# Patient Record
Sex: Male | Born: 1945 | Race: White | Hispanic: No | Marital: Married | Smoking: Never smoker
Health system: Southern US, Community
[De-identification: ages and names within clinical notes are randomized; demographics above are authoritative.]

## PROBLEM LIST (undated history)

## (undated) DIAGNOSIS — I10 Essential (primary) hypertension: Secondary | ICD-10-CM

## (undated) DIAGNOSIS — M940 Chondrocostal junction syndrome [Tietze]: Secondary | ICD-10-CM

## (undated) DIAGNOSIS — H409 Unspecified glaucoma: Secondary | ICD-10-CM

## (undated) DIAGNOSIS — E119 Type 2 diabetes mellitus without complications: Secondary | ICD-10-CM

## (undated) HISTORY — PX: MANDIBLE FRACTURE SURGERY: SHX706

## (undated) HISTORY — PX: GLAUCOMA SURGERY: SHX656

## (undated) HISTORY — PX: TONSILLECTOMY: SUR1361

---

## 2013-10-13 DIAGNOSIS — H401190 Primary open-angle glaucoma, unspecified eye, stage unspecified: Secondary | ICD-10-CM

## 2013-10-13 HISTORY — DX: Primary open-angle glaucoma, unspecified eye, stage unspecified: H40.1190

## 2017-02-11 DIAGNOSIS — H6982 Other specified disorders of Eustachian tube, left ear: Secondary | ICD-10-CM

## 2017-02-11 DIAGNOSIS — H6992 Unspecified Eustachian tube disorder, left ear: Secondary | ICD-10-CM

## 2017-02-11 HISTORY — DX: Other specified disorders of eustachian tube, left ear: H69.82

## 2017-02-11 HISTORY — DX: Unspecified Eustachian tube disorder, left ear: H69.92

## 2018-01-21 DIAGNOSIS — H8112 Benign paroxysmal vertigo, left ear: Secondary | ICD-10-CM

## 2018-01-21 HISTORY — DX: Benign paroxysmal vertigo, left ear: H81.12

## 2019-06-08 DIAGNOSIS — H401432 Capsular glaucoma with pseudoexfoliation of lens, bilateral, moderate stage: Secondary | ICD-10-CM

## 2019-06-08 HISTORY — DX: Capsular glaucoma with pseudoexfoliation of lens, bilateral, moderate stage: H40.1432

## 2019-09-12 ENCOUNTER — Ambulatory Visit: Payer: Self-pay

## 2019-09-14 DIAGNOSIS — I4891 Unspecified atrial fibrillation: Secondary | ICD-10-CM

## 2019-09-14 DIAGNOSIS — H409 Unspecified glaucoma: Secondary | ICD-10-CM | POA: Insufficient documentation

## 2019-09-14 HISTORY — DX: Unspecified atrial fibrillation: I48.91

## 2019-09-15 ENCOUNTER — Observation Stay (HOSPITAL_BASED_OUTPATIENT_CLINIC_OR_DEPARTMENT_OTHER)
Admission: EM | Admit: 2019-09-15 | Discharge: 2019-09-16 | Disposition: A | Discharge status: Home / Self Care | Payer: Medicare HMO | Attending: Cardiology | Admitting: Cardiology

## 2019-09-15 ENCOUNTER — Other Ambulatory Visit: Payer: Self-pay

## 2019-09-15 ENCOUNTER — Emergency Department (HOSPITAL_BASED_OUTPATIENT_CLINIC_OR_DEPARTMENT_OTHER): Payer: Medicare HMO

## 2019-09-15 ENCOUNTER — Encounter (HOSPITAL_BASED_OUTPATIENT_CLINIC_OR_DEPARTMENT_OTHER): Payer: Self-pay

## 2019-09-15 DIAGNOSIS — Z79899 Other long term (current) drug therapy: Secondary | ICD-10-CM | POA: Diagnosis not present

## 2019-09-15 DIAGNOSIS — Z20822 Contact with and (suspected) exposure to covid-19: Secondary | ICD-10-CM | POA: Diagnosis not present

## 2019-09-15 DIAGNOSIS — E876 Hypokalemia: Secondary | ICD-10-CM | POA: Insufficient documentation

## 2019-09-15 DIAGNOSIS — E119 Type 2 diabetes mellitus without complications: Secondary | ICD-10-CM | POA: Insufficient documentation

## 2019-09-15 DIAGNOSIS — E785 Hyperlipidemia, unspecified: Secondary | ICD-10-CM | POA: Diagnosis not present

## 2019-09-15 DIAGNOSIS — M940 Chondrocostal junction syndrome [Tietze]: Principal | ICD-10-CM | POA: Insufficient documentation

## 2019-09-15 DIAGNOSIS — I4891 Unspecified atrial fibrillation: Secondary | ICD-10-CM | POA: Diagnosis present

## 2019-09-15 DIAGNOSIS — H409 Unspecified glaucoma: Secondary | ICD-10-CM | POA: Insufficient documentation

## 2019-09-15 DIAGNOSIS — N289 Disorder of kidney and ureter, unspecified: Secondary | ICD-10-CM

## 2019-09-15 DIAGNOSIS — I10 Essential (primary) hypertension: Secondary | ICD-10-CM | POA: Diagnosis not present

## 2019-09-15 DIAGNOSIS — Z7982 Long term (current) use of aspirin: Secondary | ICD-10-CM | POA: Diagnosis not present

## 2019-09-15 HISTORY — DX: Type 2 diabetes mellitus without complications: E11.9

## 2019-09-15 HISTORY — DX: Unspecified glaucoma: H40.9

## 2019-09-15 HISTORY — DX: Essential (primary) hypertension: I10

## 2019-09-15 HISTORY — DX: Chondrocostal junction syndrome (tietze): M94.0

## 2019-09-15 LAB — CBC WITH DIFFERENTIAL/PLATELET
Abs Immature Granulocytes: 0.02 10*3/uL (ref 0.00–0.07)
Basophils Absolute: 0.1 10*3/uL (ref 0.0–0.1)
Basophils Relative: 1 %
Eosinophils Absolute: 0.6 10*3/uL — ABNORMAL HIGH (ref 0.0–0.5)
Eosinophils Relative: 6 %
HCT: 53.2 % — ABNORMAL HIGH (ref 39.0–52.0)
Hemoglobin: 18 g/dL — ABNORMAL HIGH (ref 13.0–17.0)
Immature Granulocytes: 0 %
Lymphocytes Relative: 40 %
Lymphs Abs: 3.6 10*3/uL (ref 0.7–4.0)
MCH: 31.6 pg (ref 26.0–34.0)
MCHC: 33.8 g/dL (ref 30.0–36.0)
MCV: 93.5 fL (ref 80.0–100.0)
Monocytes Absolute: 0.6 10*3/uL (ref 0.1–1.0)
Monocytes Relative: 7 %
Neutro Abs: 4.1 10*3/uL (ref 1.7–7.7)
Neutrophils Relative %: 46 %
Platelets: 268 10*3/uL (ref 150–400)
RBC: 5.69 MIL/uL (ref 4.22–5.81)
RDW: 12.9 % (ref 11.5–15.5)
WBC: 9 10*3/uL (ref 4.0–10.5)
nRBC: 0 % (ref 0.0–0.2)

## 2019-09-15 LAB — COMPREHENSIVE METABOLIC PANEL
ALT: 15 U/L (ref 0–44)
AST: 18 U/L (ref 15–41)
Albumin: 4.5 g/dL (ref 3.5–5.0)
Alkaline Phosphatase: 67 U/L (ref 38–126)
Anion gap: 11 (ref 5–15)
BUN: 18 mg/dL (ref 8–23)
CO2: 22 mmol/L (ref 22–32)
Calcium: 9.1 mg/dL (ref 8.9–10.3)
Chloride: 104 mmol/L (ref 98–111)
Creatinine, Ser: 1.35 mg/dL — ABNORMAL HIGH (ref 0.61–1.24)
GFR calc Af Amer: 60 mL/min — ABNORMAL LOW (ref >60)
GFR calc non Af Amer: 51 mL/min — ABNORMAL LOW (ref >60)
Glucose, Bld: 124 mg/dL — ABNORMAL HIGH (ref 70–99)
Potassium: 3.2 mmol/L — ABNORMAL LOW (ref 3.5–5.1)
Sodium: 137 mmol/L (ref 135–145)
Total Bilirubin: 0.6 mg/dL (ref 0.3–1.2)
Total Protein: 7.8 g/dL (ref 6.5–8.1)

## 2019-09-15 LAB — BRAIN NATRIURETIC PEPTIDE: B Natriuretic Peptide: 168.6 pg/mL — ABNORMAL HIGH (ref 0.0–100.0)

## 2019-09-15 LAB — TROPONIN I (HIGH SENSITIVITY): Troponin I (High Sensitivity): 5 ng/L (ref <18)

## 2019-09-15 LAB — MAGNESIUM: Magnesium: 2.2 mg/dL (ref 1.7–2.4)

## 2019-09-15 MED ORDER — DILTIAZEM LOAD VIA INFUSION
10.0000 mg | Freq: Once | INTRAVENOUS | Status: AC
  Administered 2019-09-15: 10 mg via INTRAVENOUS
  Filled 2019-09-15: qty 10

## 2019-09-15 MED ORDER — SODIUM CHLORIDE 0.9 % IV SOLN: INTRAVENOUS | Status: DC

## 2019-09-15 MED ORDER — SODIUM CHLORIDE 0.9 % IV BOLUS
250.0000 mL | Freq: Once | INTRAVENOUS | Status: AC
  Administered 2019-09-15: 250 mL via INTRAVENOUS

## 2019-09-15 MED ORDER — ASPIRIN 81 MG PO CHEW
324.0000 mg | CHEWABLE_TABLET | Freq: Once | ORAL | Status: AC
  Administered 2019-09-15: 324 mg via ORAL
  Filled 2019-09-15: qty 4

## 2019-09-15 MED ORDER — HEPARIN (PORCINE) 25000 UT/250ML-% IV SOLN
1300.0000 [IU]/h | INTRAVENOUS | Status: DC
  Administered 2019-09-16: 1300 [IU]/h via INTRAVENOUS
  Filled 2019-09-15: qty 250

## 2019-09-15 MED ORDER — HEPARIN BOLUS VIA INFUSION
4000.0000 [IU] | Freq: Once | INTRAVENOUS | Status: AC
  Administered 2019-09-16: 4000 [IU] via INTRAVENOUS

## 2019-09-15 MED ORDER — DILTIAZEM HCL-DEXTROSE 125-5 MG/125ML-% IV SOLN (PREMIX)
5.0000 mg/h | INTRAVENOUS | Status: DC
  Administered 2019-09-15: 5 mg/h via INTRAVENOUS
  Filled 2019-09-15: qty 125

## 2019-09-15 MED ORDER — POTASSIUM CHLORIDE CRYS ER 20 MEQ PO TBCR
40.0000 meq | EXTENDED_RELEASE_TABLET | Freq: Once | ORAL | Status: AC
  Administered 2019-09-15: 40 meq via ORAL
  Filled 2019-09-15: qty 2

## 2019-09-15 NOTE — ED Provider Notes (Signed)
TIME SEEN: 11:07 PM  CHIEF COMPLAINT: Right-sided chest discomfort  HPI: Patient is a 74 year old male with history of hypertension and diabetes no longer on medications for the past 2 years after losing weight, glaucoma who presents to the emergency department with right-sided chest discomfort that started tonight.  States that his wife checked his pulse and it was irregular.  Came to the emergency department and heart rate was in the 160s in A. fib.  No history of arrhythmia, A. fib.  Not on anticoagulation.  Does take baby aspirin daily.  Chest discomfort is now gone.  States he will intermittently get the same chest discomfort which he has been told previously was costochondritis.  States he has been experiencing it for months.  No associated shortness of breath, nausea, vomiting, dizziness or diaphoresis.  No fevers, cough, vomiting or diarrhea recently.  States he has no symptoms currently.  Denies history of peripheral vascular disease, CAD, stroke, CHF.  Does however take furosemide 3 times a week.   ROS: See HPI Constitutional: no fever  Eyes: no drainage  ENT: no runny nose   Cardiovascular:  chest pain  Resp: no SOB  GI: no vomiting GU: no dysuria Integumentary: no rash  Allergy: no hives  Musculoskeletal: no leg swelling  Neurological: no slurred speech ROS otherwise negative  PAST MEDICAL HISTORY/PAST SURGICAL HISTORY:  Past Medical History:  Diagnosis Date  . Diabetes mellitus without complication ([place])   . Hypertension     MEDICATIONS:  Prior to Admission medications   Not on File    ALLERGIES:  Not on File  SOCIAL HISTORY:  Social History   Tobacco Use  . Smoking status: Never Smoker  . Smokeless tobacco: Never Used  Substance Use Topics  . Alcohol use: Yes    Comment: occ    FAMILY HISTORY: No family history on file.  EXAM: BP (!) 198/138 (BP Location: Left Arm)   Pulse (!) 165   Temp 98.9 F (37.2 C) (Oral)   Resp 20   Ht 5\' 9"  (1.753 m)   Wt  90.7 kg   BMI 29.53 kg/m  CONSTITUTIONAL: Alert and oriented and responds appropriately to questions. Well-appearing; well-nourished, elderly, obese, extremely pleasant HEAD: Normocephalic EYES: Conjunctivae clear, pupils appear equal, EOM appear intact ENT: normal nose; moist mucous membranes NECK: Supple, normal ROM CARD: Irregularly irregular and tachycardic; S1 and S2 appreciated; no murmurs, no clicks, no rubs, no gallops RESP: Normal chest excursion without splinting or tachypnea; breath sounds clear and equal bilaterally; no wheezes, no rhonchi, no rales, no hypoxia or respiratory distress, speaking full sentences ABD/GI: Normal bowel sounds; non-distended; soft, non-tender, no rebound, no guarding, no peritoneal signs, no hepatosplenomegaly BACK:  The back appears normal EXT: Normal ROM in all joints; no deformity noted, no edema; no cyanosis; no calf tenderness or swelling SKIN: Normal color for age and race; warm; no rash on exposed skin NEURO: Moves all extremities equally PSYCH: The patient's mood and manner are appropriate.   MEDICAL DECISION MAKING: Patient here with A. fib with RVR.  No history of the same.  Currently asymptomatic.  States symptoms started with chest discomfort that is now gone.  Has had chest discomfort for months that he contributed to costochondritis.  Given it is unclear how long patient has had atrial fibrillation and he is not on anticoagulation, I do not feel he is a candidate for electrocardioversion at this time.  Will obtain cardiac labs, chest x-ray and start diltiazem.  Patient comfortable with  this plan.  Will give full dose aspirin.  ED PROGRESS: Patient's heart rate slowly improving with diltiazem.  Blood pressure is also coming down.  Still asymptomatic.  Troponin negative.  Potassium slightly low at 3.2.  Will give oral replacement.  Magnesium normal.  TSH pending.  Discussed with Dr. Alcario Drought with hospitalist service.  He is requesting admission by  specialty service given hospitalist service is extremely busy tonight.  Will discuss with cardiology.  12:19 AM  Spoke with Dr. Kalman Shan, cardiology fellow on-call.  Agrees to admission to stepdown, inpatient bed under Dr. Carlyle Dolly.  I have placed admission orders.  Dr. Kalman Shan also request that once patient's heart rate is under 110 that we start metoprolol 25 mg every 6 hours.  Patient updated with plan.     Covid test negative.  2:30 AM  [facility] at bedside for transport.  Patient stable and continues to be asymptomatic.   EKG Interpretation  Date/Time:  Tuesday September 15 2019 22:48:13 EST Ventricular Rate:  155 PR Interval:    QRS Duration: 83 QT Interval:  315 QTC Calculation: 506 R Axis:   34 Text Interpretation: Atrial fibrillation with rapid V-rate Low voltage, precordial leads Repolarization abnormality, prob rate related No previous ECGs available Confirmed by Fredia Sorrow [phone]) on 09/15/2019 10:50:30 PM        CRITICAL CARE Performed by: Cyril Mourning Deidrick Rainey   Total critical care time: 45 minutes  Critical care time was exclusive of separately billable procedures and treating other patients.  Critical care was necessary to treat or prevent imminent or life-threatening deterioration.  Critical care was time spent personally by me on the following activities: development of treatment plan with patient and/or surrogate as well as nursing, discussions with consultants, evaluation of patient's response to treatment, examination of patient, obtaining history from patient or surrogate, ordering and performing treatments and interventions, ordering and review of laboratory studies, ordering and review of radiographic studies, pulse oximetry and re-evaluation of patient's condition.   Trevone Linden was evaluated in Emergency Department on 09/15/2019 for the symptoms described in the history of present illness. He was evaluated in the context of the global COVID-19 pandemic,  which necessitated consideration that the patient might be at risk for infection with the SARS-CoV-2 virus that causes COVID-19. Institutional protocols and algorithms that pertain to the evaluation of patients at risk for COVID-19 are in a state of rapid change based on information released by regulatory bodies including the CDC and federal and state organizations. These policies and algorithms were followed during the patient's care in the ED.  Patient was seen wearing N95, face shield, gloves.    Stony Stegmann, Delice Bison, DO 09/16/19 0230

## 2019-09-15 NOTE — ED Notes (Signed)
ED Provider at bedside. 

## 2019-09-15 NOTE — Progress Notes (Signed)
ANTICOAGULATION CONSULT NOTE - Initial Consult  Pharmacy Consult for Heparin Indication: atrial fibrillation  Not on File  Patient Measurements: Height: 5\' 9"  (175.3 cm) Weight: 200 lb (90.7 kg) IBW/kg (Calculated) : 70.7 Heparin Dosing Weight: 85 kg  Vital Signs: Temp: 98.9 F (37.2 C) (02/02 2250) Temp Source: Oral (02/02 2250) BP: 160/104 (02/02 2346) Pulse Rate: 90 (02/02 2346)  Labs: Recent Labs    09/15/19 2255 09/15/19 2308  HGB 18.0*  --   HCT 53.2*  --   PLT 268  --   CREATININE 1.35*  --   TROPONINIHS  --  5    Estimated Creatinine Clearance: 53.4 mL/min (A) (by C-G formula based on SCr of 1.35 mg/dL (H)).   Medical History: Past Medical History:  Diagnosis Date  . Diabetes mellitus without complication ([place])   . Hypertension     Medications:  Combigan  Lasix    Assessment: 74 y.o. male with Afib for heparin  Goal of Therapy:  Heparin level 0.3-0.7 units/ml Monitor platelets by anticoagulation protocol: Yes   Plan:  Heparin 4000 units IV bolus, then start heparin 1300 units/hr Check heparin level in 8 hours.   Caryl Pina 09/15/2019,11:56 PM

## 2019-09-15 NOTE — ED Notes (Signed)
zackawki MD called to room

## 2019-09-15 NOTE — ED Triage Notes (Signed)
Pt c/o CP x 30 min-pt taken to tx area-NAD-steady gait

## 2019-09-16 ENCOUNTER — Inpatient Hospital Stay (HOSPITAL_BASED_OUTPATIENT_CLINIC_OR_DEPARTMENT_OTHER): Payer: Medicare HMO

## 2019-09-16 ENCOUNTER — Encounter (HOSPITAL_COMMUNITY): Payer: Self-pay | Admitting: Cardiology

## 2019-09-16 DIAGNOSIS — Z7982 Long term (current) use of aspirin: Secondary | ICD-10-CM | POA: Diagnosis not present

## 2019-09-16 DIAGNOSIS — E119 Type 2 diabetes mellitus without complications: Secondary | ICD-10-CM

## 2019-09-16 DIAGNOSIS — N179 Acute kidney failure, unspecified: Secondary | ICD-10-CM | POA: Diagnosis not present

## 2019-09-16 DIAGNOSIS — N289 Disorder of kidney and ureter, unspecified: Secondary | ICD-10-CM

## 2019-09-16 DIAGNOSIS — I4891 Unspecified atrial fibrillation: Secondary | ICD-10-CM

## 2019-09-16 DIAGNOSIS — Z20822 Contact with and (suspected) exposure to covid-19: Secondary | ICD-10-CM | POA: Diagnosis not present

## 2019-09-16 DIAGNOSIS — M940 Chondrocostal junction syndrome [Tietze]: Secondary | ICD-10-CM | POA: Diagnosis not present

## 2019-09-16 DIAGNOSIS — I10 Essential (primary) hypertension: Secondary | ICD-10-CM

## 2019-09-16 DIAGNOSIS — Z79899 Other long term (current) drug therapy: Secondary | ICD-10-CM | POA: Diagnosis not present

## 2019-09-16 DIAGNOSIS — E876 Hypokalemia: Secondary | ICD-10-CM

## 2019-09-16 DIAGNOSIS — H409 Unspecified glaucoma: Secondary | ICD-10-CM | POA: Diagnosis not present

## 2019-09-16 DIAGNOSIS — E785 Hyperlipidemia, unspecified: Secondary | ICD-10-CM | POA: Diagnosis not present

## 2019-09-16 HISTORY — DX: Disorder of kidney and ureter, unspecified: N28.9

## 2019-09-16 HISTORY — DX: Hypokalemia: E87.6

## 2019-09-16 HISTORY — DX: Unspecified atrial fibrillation: I48.91

## 2019-09-16 HISTORY — DX: Essential (primary) hypertension: I10

## 2019-09-16 LAB — HEMOGLOBIN A1C
Hgb A1c MFr Bld: 5.5 % (ref 4.8–5.6)
Mean Plasma Glucose: 111.15 mg/dL

## 2019-09-16 LAB — BASIC METABOLIC PANEL
Anion gap: 11 (ref 5–15)
BUN: 17 mg/dL (ref 8–23)
CO2: 21 mmol/L — ABNORMAL LOW (ref 22–32)
Calcium: 8.9 mg/dL (ref 8.9–10.3)
Chloride: 109 mmol/L (ref 98–111)
Creatinine, Ser: 1.24 mg/dL (ref 0.61–1.24)
GFR calc Af Amer: >60 mL/min (ref >60)
GFR calc non Af Amer: 57 mL/min — ABNORMAL LOW (ref >60)
Glucose, Bld: 125 mg/dL — ABNORMAL HIGH (ref 70–99)
Potassium: 5 mmol/L (ref 3.5–5.1)
Sodium: 141 mmol/L (ref 135–145)

## 2019-09-16 LAB — SARS CORONAVIRUS 2 BY RT PCR (HOSPITAL ORDER, PERFORMED IN ~~LOC~~ HOSPITAL LAB): SARS Coronavirus 2: NEGATIVE

## 2019-09-16 LAB — LIPID PANEL
Cholesterol: 217 mg/dL — ABNORMAL HIGH (ref 0–200)
HDL: 42 mg/dL (ref >40)
LDL Cholesterol: 149 mg/dL — ABNORMAL HIGH (ref 0–99)
Total CHOL/HDL Ratio: 5.2 RATIO
Triglycerides: 132 mg/dL (ref <150)
VLDL: 26 mg/dL (ref 0–40)

## 2019-09-16 LAB — GLUCOSE, CAPILLARY
Glucose-Capillary: 113 mg/dL — ABNORMAL HIGH (ref 70–99)
Glucose-Capillary: 91 mg/dL (ref 70–99)

## 2019-09-16 LAB — TSH: TSH: 1.816 u[IU]/mL (ref 0.350–4.500)

## 2019-09-16 LAB — ECHOCARDIOGRAM COMPLETE
Height: 69 in
Weight: 3238.4 oz

## 2019-09-16 MED ORDER — APIXABAN 5 MG PO TABS
5.0000 mg | ORAL_TABLET | Freq: Two times a day (BID) | ORAL | Status: DC
  Administered 2019-09-16: 5 mg via ORAL
  Filled 2019-09-16: qty 1

## 2019-09-16 MED ORDER — DILTIAZEM HCL ER COATED BEADS 120 MG PO CP24
120.0000 mg | ORAL_CAPSULE | Freq: Every day | ORAL | Status: DC
  Administered 2019-09-16: 120 mg via ORAL
  Filled 2019-09-16: qty 1

## 2019-09-16 MED ORDER — BRIMONIDINE TARTRATE 0.2 % OP SOLN
1.0000 [drp] | Freq: Two times a day (BID) | OPHTHALMIC | Status: DC
  Filled 2019-09-16: qty 5

## 2019-09-16 MED ORDER — APIXABAN 5 MG PO TABS: 5.0000 mg | ORAL_TABLET | Freq: Two times a day (BID) | ORAL | 3 refills | Status: DC

## 2019-09-16 MED ORDER — HEPARIN (PORCINE) 25000 UT/250ML-% IV SOLN: 1300.0000 [IU]/h | INTRAVENOUS | Status: DC

## 2019-09-16 MED ORDER — LISINOPRIL 10 MG PO TABS
10.0000 mg | ORAL_TABLET | Freq: Every day | ORAL | Status: DC
  Administered 2019-09-16: 10 mg via ORAL
  Filled 2019-09-16: qty 1

## 2019-09-16 MED ORDER — INSULIN ASPART 100 UNIT/ML ~~LOC~~ SOLN: 0.0000 [IU] | Freq: Three times a day (TID) | SUBCUTANEOUS | Status: DC

## 2019-09-16 MED ORDER — ACETAMINOPHEN 325 MG PO TABS: 650.0000 mg | ORAL_TABLET | ORAL | Status: DC | PRN

## 2019-09-16 MED ORDER — ONDANSETRON HCL 4 MG/2ML IJ SOLN: 4.0000 mg | Freq: Four times a day (QID) | INTRAMUSCULAR | Status: DC | PRN

## 2019-09-16 MED ORDER — ATORVASTATIN CALCIUM 40 MG PO TABS: 40.0000 mg | ORAL_TABLET | Freq: Every day | ORAL | Status: DC

## 2019-09-16 MED ORDER — DILTIAZEM HCL ER COATED BEADS 120 MG PO CP24: 120.0000 mg | ORAL_CAPSULE | Freq: Every day | ORAL | 6 refills | Status: DC

## 2019-09-16 MED ORDER — LISINOPRIL 10 MG PO TABS: 10.0000 mg | ORAL_TABLET | Freq: Every day | ORAL | 6 refills | Status: DC

## 2019-09-16 MED ORDER — HYDROCHLOROTHIAZIDE 25 MG PO TABS
25.0000 mg | ORAL_TABLET | Freq: Every day | ORAL | Status: DC
  Administered 2019-09-16: 25 mg via ORAL
  Filled 2019-09-16: qty 1

## 2019-09-16 MED ORDER — BRIMONIDINE TARTRATE-TIMOLOL 0.2-0.5 % OP SOLN: 1.0000 [drp] | Freq: Two times a day (BID) | OPHTHALMIC | Status: DC

## 2019-09-16 MED ORDER — ATORVASTATIN CALCIUM 40 MG PO TABS: 40.0000 mg | ORAL_TABLET | Freq: Every day | ORAL | 6 refills | Status: DC

## 2019-09-16 MED ORDER — TIMOLOL MALEATE 0.5 % OP SOLN
1.0000 [drp] | Freq: Two times a day (BID) | OPHTHALMIC | Status: DC
  Filled 2019-09-16: qty 5

## 2019-09-16 MED ORDER — BRIMONIDINE TARTRATE 0.2 % OP SOLN: 1.0000 [drp] | Freq: Two times a day (BID) | OPHTHALMIC | 12 refills | Status: DC

## 2019-09-16 MED ORDER — HYDROCHLOROTHIAZIDE 25 MG PO TABS: 25.0000 mg | ORAL_TABLET | Freq: Every day | ORAL | 6 refills | Status: DC

## 2019-09-16 MED ORDER — DORZOLAMIDE HCL 2 % OP SOLN
1.0000 [drp] | Freq: Three times a day (TID) | OPHTHALMIC | Status: DC
  Filled 2019-09-16: qty 10

## 2019-09-16 MED ORDER — METOPROLOL TARTRATE 25 MG PO TABS
25.0000 mg | ORAL_TABLET | Freq: Four times a day (QID) | ORAL | Status: DC
  Administered 2019-09-16: 25 mg via ORAL
  Filled 2019-09-16: qty 1

## 2019-09-16 MED ORDER — POTASSIUM CHLORIDE CRYS ER 20 MEQ PO TBCR
40.0000 meq | EXTENDED_RELEASE_TABLET | Freq: Once | ORAL | Status: AC
  Administered 2019-09-16: 40 meq via ORAL
  Filled 2019-09-16: qty 2

## 2019-09-16 MED FILL — CARTIA XT 120 MG CP24: 120 | 30 days supply | Qty: 30 | Fill #0

## 2019-09-16 MED FILL — ELIQUIS 5 MG TABLET: 5 | 30 days supply | Qty: 60 | Fill #0

## 2019-09-16 MED FILL — ATORVASTATIN CALCIUM 40 MG: 40 | 30 days supply | Qty: 30 | Fill #0

## 2019-09-16 MED FILL — HYDROCHLOROTHIAZIDE 25 MG T: 25 | 30 days supply | Qty: 30 | Fill #0

## 2019-09-16 MED FILL — LISINOPRIL 10 MG TABS: 10 | 30 days supply | Qty: 30 | Fill #0

## 2019-09-16 NOTE — TOC Transition Note (Signed)
Transition of Care [facility]) - CM/SW Discharge Note   Patient Details  Name: Jeffrey Contreras MRN: [id number] Date of Birth: Jun 26, 1946  Transition of Care [facility]) CM/SW Contact:  Marilu Favre, RN Phone Number: 09/16/2019, 1:21 PM   Clinical Narrative:    Spoke to patient at bedside. Discussed Eliquis benefits check. Patient will get first 30 free from Transitions of Care Pharmacy prior to discharge.  Final next level of care: Home/Self Care Barriers to Discharge: No Barriers Identified   Patient Goals and CMS Choice Patient states their goals for this hospitalization and ongoing recovery are:: to return to home CMS Medicare.gov Compare Post Acute Care list provided to:: Patient Choice offered to / list presented to : NA  Discharge Placement                       Discharge Plan and Services   Discharge Planning Services: CM Consult, Medication Assistance            DME Arranged: N/A         HH Arranged: NA          Social Determinants of Health (SDOH) Interventions     Readmission Risk Interventions No flowsheet data found.

## 2019-09-16 NOTE — Plan of Care (Signed)

## 2019-09-16 NOTE — Discharge Instructions (Signed)
Information on my medicine - ELIQUIS® (apixaban) ° °This medication education was reviewed with me or my healthcare representative as part of my discharge preparation.  The pharmacist that spoke with me during my hospital stay was:  Jasilyn Holderman Rhea, RPH ° °Why was Eliquis® prescribed for you? °Eliquis® was prescribed for you to reduce the risk of a blood clot forming that can cause a stroke if you have a medical condition called atrial fibrillation (a type of irregular heartbeat). ° °What do You need to know about Eliquis® ? °Take your Eliquis® TWICE DAILY - one tablet in the morning and one tablet in the evening with or without food. If you have difficulty swallowing the tablet whole please discuss with your pharmacist how to take the medication safely. ° °Take Eliquis® exactly as prescribed by your doctor and DO NOT stop taking Eliquis® without talking to the doctor who prescribed the medication.  Stopping may increase your risk of developing a stroke.  Refill your prescription before you run out. ° °After discharge, you should have regular check-up appointments with your healthcare provider that is prescribing your Eliquis®.  In the future your dose may need to be changed if your kidney function or weight changes by a significant amount or as you get older. ° °What do you do if you miss a dose? °If you miss a dose, take it as soon as you remember on the same day and resume taking twice daily.  Do not take more than one dose of ELIQUIS at the same time to make up a missed dose. ° °Important Safety Information °A possible side effect of Eliquis® is bleeding. You should call your healthcare provider right away if you experience any of the following: °  Bleeding from an injury or your nose that does not stop. °  Unusual colored urine (red or dark brown) or unusual colored stools (red or black). °  Unusual bruising for unknown reasons. °  A serious fall or if you hit your head (even if there is no  bleeding). ° °Some medicines may interact with Eliquis® and might increase your risk of bleeding or clotting while on Eliquis®. To help avoid this, consult your healthcare provider or pharmacist prior to using any new prescription or non-prescription medications, including herbals, vitamins, non-steroidal anti-inflammatory drugs (NSAIDs) and supplements. ° °This website has more information on Eliquis® (apixaban): http://www.eliquis.com/eliquis/home ° °

## 2019-09-16 NOTE — Progress Notes (Signed)
Progress Note  Patient Name: Jeffrey Contreras Date of Encounter: 09/16/2019  Primary Cardiologist: Buford Dresser, MD   Subjective   Overnight patient spontaneously converted to sinus rhythm, rates from 50-70s. Patient is feeling great this morning. No chest pain.   Inpatient Medications    Scheduled Meds: . apixaban  5 mg Oral BID  . atorvastatin  40 mg Oral q1800  . brimonidine  1 drop Both Eyes BID  . diltiazem  120 mg Oral Daily  . dorzolamide  1 drop Both Eyes TID  . hydrochlorothiazide  25 mg Oral Daily  . insulin aspart  0-15 Units Subcutaneous TID WC  . lisinopril  10 mg Oral Daily  . timolol  1 drop Both Eyes BID   Continuous Infusions: . heparin     PRN Meds: acetaminophen, ondansetron (ZOFRAN) IV   Vital Signs    Vitals:   09/16/19 0100 09/16/19 0115 09/16/19 0130 09/16/19 0310  BP: (!) 170/99  132/88 (!) 167/80  Pulse:  (!) 102 (!) 107 70  Resp: (!) 32 18 19   Temp:    97.8 F (36.6 C)  TempSrc:    Oral  SpO2:  97% 97% 98%  Weight:    91.8 kg  Height:    5\' 9"  (1.753 m)    Intake/Output Summary (Last 24 hours) at 09/16/2019 0755 Last data filed at 09/16/2019 0310 Gross per 24 hour  Intake 350.16 ml  Output 500 ml  Net -149.84 ml   Last 3 Weights 09/16/2019 09/15/2019  Weight (lbs) 202 lb 6.4 oz 200 lb  Weight (kg) 91.808 kg 90.719 kg      Telemetry    NSR/sinus bradycardia with PACs; HR 50-70s - Personally Reviewed  ECG    Sinus bradycardia, 55 bpm, nonspecific T wave abnormality  - Personally Reviewed  Physical Exam   GEN: No acute distress.   Neck: No JVD Cardiac: RRR, no murmurs, rubs, or gallops.  Respiratory: Clear to auscultation bilaterally. GI: Soft, nontender, non-distended  MS: No edema; No deformity. Neuro:  Nonfocal  Psych: Normal affect   Labs    High Sensitivity Troponin:   Recent Labs  Lab 09/15/19 2308  TROPONINIHS 5      Chemistry Recent Labs  Lab 09/15/19 2255 09/16/19 0431  NA 137 141  K 3.2*  5.0  CL 104 109  CO2 22 21*  GLUCOSE 124* 125*  BUN 18 17  CREATININE 1.35* 1.24  CALCIUM 9.1 8.9  PROT 7.8  --   ALBUMIN 4.5  --   AST 18  --   ALT 15  --   ALKPHOS 67  --   BILITOT 0.6  --   GFRNONAA 51* 57*  GFRAA 60* >60  ANIONGAP 11 11     Hematology Recent Labs  Lab 09/15/19 2255  WBC 9.0  RBC 5.69  HGB 18.0*  HCT 53.2*  MCV 93.5  MCH 31.6  MCHC 33.8  RDW 12.9  PLT 268    BNP Recent Labs  Lab 09/15/19 2308  BNP 168.6*     DDimer No results for input(s): DDIMER in the last 168 hours.   Radiology    DG Chest Port 1 View  Result Date: 09/15/2019 CLINICAL DATA:  Chest pain. EXAM: PORTABLE CHEST 1 VIEW COMPARISON:  None. FINDINGS: Very mild linear atelectasis is seen within the left lung base. There is no evidence of acute infiltrate, pleural effusion or pneumothorax. The heart size and mediastinal contours are within normal limits. Both lungs  are clear. The visualized skeletal structures are unremarkable. IMPRESSION: No active disease. Electronically Signed   By: Virgina Norfolk M.D.   On: 09/15/2019 23:09    Cardiac Studies   Echo ordered  Patient Profile     74 y.o. male with pmh of HTN, diet-controlled DM, severe costochondritis who presented for irregular heart beat.   Assessment & Plan    Afib RVR - This is new diagnosis. Hr was in the 160s in the ED and asymptomatic started on IV dilt and IV heparin. Metoprolol 25 mg q6 hours also started. - Overnight patient spontaneously converted to NSR - Dilt transitioned to oral 120 mg daily. BB was stopped - IV heparin switched to Eliquis 5 mg BID - K 5.0, Mg 2.2, TSH 0.6 - Echo ordered - Heart rates are 50-60s  HTN - Patient was taking lasix 20 mg three times weekly for intermittent swelling>>discontinued on admission - lisinopril 10 mg and HCTZ 25 mg daily added this admission.  - Pressures were high on admission - Dilt added as above - might need further titrate medications if pressures don't  improve  Hypokalemia - resolved, K 5.0 this AM after supplementation  DM2 - A1C 5.5 - SSI   HLD - LDL 149 - start statin   For questions or updates, please contact [facility] Please consult [email or web address] for contact info under        Signed, Delainey Winstanley Ninfa Meeker, PA-C  09/16/2019, 7:55 AM

## 2019-09-16 NOTE — Discharge Summary (Addendum)
Discharge Summary    Patient ID: Jeffrey Contreras MRN: [id number]; DOB: 1945-11-18  Admit date: 09/15/2019 Discharge date: 09/16/2019  Primary Care Provider: Patient, No Pcp Per  Primary Cardiologist: Shirlee More, MD  Primary Electrophysiologist:  None   Discharge Diagnoses    Active Problems:   Atrial fibrillation with RVR [facility])   Essential hypertension   Type 2 diabetes mellitus ([place])   Hypokalemia   Renal insufficiency   Costochondritis   Diagnostic Studies/Procedures    Echo 09/15/2010 Unofficial read by Dr. Harrell Gave: No severe abnormalities; Estimated EF 45-55% _____________   History of Present Illness     Jeffrey Contreras is a 74 y.o. male with history of HTN, diet-controlled diabetes, severe costochondritis who presented to the ED 09/16/19 for irregular heart beat. The patient was feeling his normal self watching hockey when he felt intermittent chest discomfort along his sternum. He had felt similar chest discomfort with his costochondritis but this had improved with injections. A little while later he was giving his wife a massage and felt a flare of he costochondritis. He felt his pulse and noted that it was irregular. He had no exertional symptoms and denied fatigue, palpitations, dyspnea, or dizziness. Earlier in the week he had played golf and had no symptoms. Reports his baseline heart rate is around 60. He had been takin lasix 20 mg three times weekly for intermittent swelling. Due to the irregular heart rhythm the patient decided to go to the ED.   Hospital Course     Consultants: None  In the ED he was found to be in Afib RVR with rates around 150s. No chest pain. BP were also very elevated with systolics [id number]. HS troponin was negative and labs were otherwise insignificant. Creatinine 1.35. K 3.2, Hgb 18. The patient was started on IV heparin. CHADSVASC 3 (4 next month). IV diltiazem was started for rate control. Metoprolol 25 mg Q6 hours was also added but  only one dose was given before this was discontinued. Lisinopril 10 mg and HCTZ 25 mg were started for blood pressure control. Patient was given oral potassium for hypokalemia. The patient spontaneously converted to sinus rhythm overnight, rates 50-60s. IV heparin was transitioned to Eliquis 5 mg BID. IV diltiazem was transitioned to 120 mg daily. TSH came back at 1.8. Follow-up labs showed K 5.0, Mag 2.2. Creatinine 1.24. A1C came out to be 5.5. Lipid panel showed LDL 149, HDL 42, TG 132. He was started on Atorvastatin 40 mg daily. Echo was ordered. Unofficial read by Dr. Harrell Gave showed no severe abnormalities and an estimated EF 45-55%. The patient remained in sinus rhythm with rates in the 50-60s. Will plan to send the patient home on HCTZ 25 mg daily and lisinopril 10 mg daily and plan to discontinue the lasix. Will send in a new prescription for Eliquis 5 mg BID. Let the patient know to stop Aspirin. Atorvastatin 40 mg daily prescriptions was sent in as well. The patient might need some further titration of BP medications given pressures were mildly elevated at discharge.   The patient was evaluated by Dr. Harrell Gave on 09/16/19 and felt to be stable for discharge.   Did the patient have an acute coronary syndrome (MI, NSTEMI, STEMI, etc) this admission?:  No                               Did the patient have a percutaneous coronary intervention (stent / angioplasty)?:  No.   _____________  Discharge Vitals Blood pressure (!) 141/88, pulse (!) 55, temperature 97.6 F (36.4 C), temperature source Oral, resp. rate 19, height 5\' 9"  (1.753 m), weight 91.8 kg, SpO2 98 %.  Filed Weights   09/15/19 2249 09/16/19 0310  Weight: 90.7 kg 91.8 kg    Labs & Radiologic Studies    CBC Recent Labs    09/15/19 2255  WBC 9.0  NEUTROABS 4.1  HGB 18.0*  HCT 53.2*  MCV 93.5  PLT [id number]   Basic Metabolic Panel Recent Labs    09/15/19 2255 09/15/19 2308 09/16/19 0431  NA 137  --  141  K 3.2*  --   5.0  CL 104  --  109  CO2 22  --  21*  GLUCOSE 124*  --  125*  BUN 18  --  17  CREATININE 1.35*  --  1.24  CALCIUM 9.1  --  8.9  MG  --  2.2  --    Liver Function Tests Recent Labs    09/15/19 2255  AST 18  ALT 15  ALKPHOS 67  BILITOT 0.6  PROT 7.8  ALBUMIN 4.5   No results for input(s): LIPASE, AMYLASE in the last 72 hours. High Sensitivity Troponin:   Recent Labs  Lab 09/15/19 2308  TROPONINIHS 5    BNP Invalid input(s): POCBNP D-Dimer No results for input(s): DDIMER in the last 72 hours. Hemoglobin A1C Recent Labs    09/16/19 0431  HGBA1C 5.5   Fasting Lipid Panel Recent Labs    09/16/19 0431  CHOL 217*  HDL 42  LDLCALC 149*  TRIG 132  CHOLHDL 5.2   Thyroid Function Tests Recent Labs    09/15/19 2308  TSH 1.816   _____________  DG Chest Port 1 View  Result Date: 09/15/2019 CLINICAL DATA:  Chest pain. EXAM: PORTABLE CHEST 1 VIEW COMPARISON:  None. FINDINGS: Very mild linear atelectasis is seen within the left lung base. There is no evidence of acute infiltrate, pleural effusion or pneumothorax. The heart size and mediastinal contours are within normal limits. Both lungs are clear. The visualized skeletal structures are unremarkable. IMPRESSION: No active disease. Electronically Signed   By: Virgina Norfolk M.D.   On: 09/15/2019 23:09   Disposition   Pt is being discharged home today in good condition.  Follow-up Plans & Appointments    Follow-up Information    Richardo Priest, MD Follow up on 09/21/2019.   Specialty: Cardiology Why: Please go to your hospital follow-up February 8th at 10:40 AM Contact information: [place] [address] [phone]          Discharge Instructions    Amb referral to AFIB Clinic   Complete by: As directed       Discharge Medications   Allergies as of 09/16/2019   Not on File     Medication List    STOP taking these medications   aspirin 81 MG chewable tablet     cetirizine 10 MG chewable tablet Commonly known as: ZYRTEC   furosemide 20 MG tablet Commonly known as: LASIX     TAKE these medications   apixaban 5 MG Tabs tablet Commonly known as: ELIQUIS Take 1 tablet (5 mg total) by mouth 2 (two) times daily.   atorvastatin 40 MG tablet Commonly known as: LIPITOR Take 1 tablet (40 mg total) by mouth daily at 6 PM.   brimonidine 0.2 % ophthalmic solution Commonly known as: [place]  1 drop into both eyes 2 (two) times daily.   Combigan 0.2-0.5 % ophthalmic solution Generic drug: brimonidine-timolol Place 1 drop into both eyes every 12 (twelve) hours.   diltiazem 120 MG 24 hr capsule Commonly known as: CARDIZEM CD Take 1 capsule (120 mg total) by mouth daily. Start taking on: September 17, 2019   dorzolamide 2 % ophthalmic solution Commonly known as: TRUSOPT 1 drop 3 (three) times daily.   hydrochlorothiazide 25 MG tablet Commonly known as: HYDRODIURIL Take 1 tablet (25 mg total) by mouth daily. Start taking on: September 17, 2019   lisinopril 10 MG tablet Commonly known as: ZESTRIL Take 1 tablet (10 mg total) by mouth daily. Start taking on: September 17, 2019          Outstanding Labs/Studies   None  Duration of Discharge Encounter   Greater than 30 minutes including physician time.  Signed, Natalyia Innes Ninfa Meeker, PA-C 09/16/2019, 3:38 PM

## 2019-09-16 NOTE — Care Management CC44 (Signed)
Condition Code 44 Documentation Completed  Patient Details  Name: Eliason Membreno MRN: [id number] Date of Birth: Sep 21, 1945   Condition Code 44 given:  Yes Patient signature on Condition Code 44 notice:  Yes Documentation of 2 MD's agreement:  Yes Code 44 added to claim:  Yes    Marilu Favre, RN 09/16/2019, 1:19 PM

## 2019-09-16 NOTE — Progress Notes (Signed)
Echocardiogram 2D Echocardiogram has been performed.  Oneal Deputy Emilyrose Darrah 09/16/2019, 9:58 AM

## 2019-09-16 NOTE — Care Management (Signed)
Entered benefits check for Eliquis 5mg  BID -  Magdalen Spatz RN

## 2019-09-16 NOTE — TOC Benefit Eligibility Note (Signed)
Transition of Care [facility]) Benefit Eligibility Note    Patient Details  Name: Pellegrino Baine MRN: [id number] Date of Birth: 1946/03/21   Medication/Dose: Arne Cleveland  5 MG BID  Covered?: Yes  Tier: 3 Drug  Prescription Coverage Preferred Pharmacy: [facility]  AND [facility]  Spoke with Person/Company/Phone Number:: CARNESICA   @ [employer] [id number] # [phone]  Co-Pay: $ 45.00  Prior Approval: No  Deductible: (NO DEDUCTIBLE WITH PLAN /  I/COVERAGE STAGE TWO)  Additional Notes: Q/L TWO PILL PER DAY    Memory Argue Phone Number: 09/16/2019, 10:14 AM

## 2019-09-16 NOTE — H&P (Signed)
Cardiology History & Physical    Patient ID: Jeffrey Contreras MRN: [id number], DOB/AGE: 75-May-1947   Admit date: 09/15/2019  Primary Physician: Patient, No Pcp Per Primary Cardiologist: Dr. Bettina Gavia (scheduled for initial visit)  Patient Profile    Jeffrey Contreras is a 74 year old man with a history of HTN, diet-controlled diabetes, and severe costochondritis that previously required injections who presented for evaluation of an irregular heart beat.   History of Present Illness    Jeffrey Contreras was at home watching hockey when he another recurrence of chest discomfort along his sternum. This has been ongoing intermittently for years and ultimately diagnosed as costochondritis and improved with injections. The pain flairs sometimes when he uses his arms. Earlier this evening he was giving his wife a massage and later had a mild flare typical of his costochondritis. He felt his pulse and noticed that it felt irregular. His wife noted the same and that it was fast, so urged him to go to the ED for evaluation. He denied any exertional symptoms and no fatigue, palpitations, dyspnea, or dizziness. Just earlier this week he played 36 holes of golf and felt great. He underwent 24 hour holter monitoring in the past as part of an evaluation for what turned out to be vertigo, but has never been diagnosed with any arrhythmia. Reports baseline heart rate right about 60. He denies any history of abnormal bleeding or stroke. He takes Lasix 20mg  3 times weekly for mild edema but carries no heart failure diagnosis. He was tested for sleep apnea 15 years ago (negative) - endorses snoring but no witnessed apnea or unrefreshing sleeping.   He initially went to the [facility] ED and was found to be in AF with RVR, rates around 150s, no chest pain, also severely hypertensive (198/138->170/121). Troponin was negative. Labs otherwise significant for Cr 1.35 (unknown baseline), K 3.2, Hgb 18.   Past Medical History   Past  Medical History:  Diagnosis Date  . Costochondritis    required injections  . Diabetes mellitus without complication ([place])   . Glaucoma   . Hypertension     Past Surgical History:  Procedure Laterality Date  . GLAUCOMA SURGERY       Allergies Not on File  Home Medications    Prior to Admission medications   Medication Sig Start Date End Date Taking? Authorizing Provider  aspirin 81 MG chewable tablet Chew 81 mg by mouth daily.   Yes [provider]  brimonidine-timolol (COMBIGAN) 0.2-0.5 % ophthalmic solution Place 1 drop into both eyes every 12 (twelve) hours.   Yes [provider]  cetirizine (ZYRTEC) 10 MG chewable tablet Chew 10 mg by mouth daily.   Yes [provider]  dorzolamide (TRUSOPT) 2 % ophthalmic solution 1 drop 3 (three) times daily.   Yes [provider]  furosemide (LASIX) 20 MG tablet Take 20 mg by mouth.   Yes [provider]    Family History    History reviewed. No pertinent family history. has no family status information on file.    Social History    Social History   Socioeconomic History  . Marital status: Married    Spouse name: Not on file  . Number of children: Not on file  . Years of education: Not on file  . Highest education level: Not on file  Occupational History  . Not on file  Tobacco Use  . Smoking status: Never Smoker  . Smokeless tobacco: Never Used  Substance and  Sexual Activity  . Alcohol use: Yes    Comment: occ  . Drug use: Never  . Sexual activity: Not on file  Other Topics Concern  . Not on file  Social History Narrative  . Not on file   Social Determinants of Health   Financial Resource Strain:   . Difficulty of Paying Living Expenses: Not on file  Food Insecurity:   . Worried About Charity fundraiser in the Last Year: Not on file  . Ran Out of Food in the Last Year: Not on file  Transportation Needs:   . Lack of Transportation (Medical): Not on file  . Lack of  Transportation (Non-Medical): Not on file  Physical Activity:   . Days of Exercise per Week: Not on file  . Minutes of Exercise per Session: Not on file  Stress:   . Feeling of Stress : Not on file  Social Connections:   . Frequency of Communication with Friends and Family: Not on file  . Frequency of Social Gatherings with Friends and Family: Not on file  . Attends Religious Services: Not on file  . Active Member of Clubs or Organizations: Not on file  . Attends Archivist Meetings: Not on file  . Marital Status: Not on file  Intimate Partner Violence:   . Fear of Current or Ex-Partner: Not on file  . Emotionally Abused: Not on file  . Physically Abused: Not on file  . Sexually Abused: Not on file     Review of Systems    General:  No chills, fever, night sweats or weight changes.  Cardiovascular:  See HPI Dermatological: No rash, lesions/masses Respiratory: No cough, dyspnea Urologic: No hematuria, dysuria Abdominal:   No nausea, vomiting, diarrhea, bright red blood per rectum, melena, or hematemesis Neurologic:  No visual changes, wkns, changes in mental status. All other systems reviewed and are otherwise negative except as noted above.  Physical Exam    BP (!) 167/80   Pulse 70   Temp 97.8 F (36.6 C) (Oral)   Resp 19   Ht 5\' 9"  (1.753 m)   Wt 91.8 kg   SpO2 98%   BMI 29.89 kg/m  General: Alert, NAD HEENT: Normal  Neck: No bruits or JVD. Lungs:  Resp regular and unlabored, CTA bilaterally. Heart: Regular rhythm, no s3, s4, or murmurs. Abdomen: Soft, non-tender, non-distended, BS +.  Extremities: Warm. No clubbing, cyanosis or edema. DP/PT/Radials 2+ and equal bilaterally. Psych: Normal affect. Neuro: Alert and oriented. No gross focal deficits. No abnormal movements.  Labs    Cardiac Panel (last 3 results) Recent Labs    09/15/19 2308  TROPONINIHS 5   Lab Results  Component Value Date   WBC 9.0 09/15/2019   HGB 18.0 (H) 09/15/2019   HCT  53.2 (H) 09/15/2019   MCV 93.5 09/15/2019   PLT 268 09/15/2019    Recent Labs  Lab 09/15/19 2255  NA 137  K 3.2*  CL 104  CO2 22  BUN 18  CREATININE 1.35*  CALCIUM 9.1  PROT 7.8  BILITOT 0.6  ALKPHOS 67  ALT 15  AST 18  GLUCOSE 124*   No results found for: CHOL, HDL, LDLCALC, TRIG No results found for: [facility]   Radiology Studies    DG Chest Port 1 View  Result Date: 09/15/2019 CLINICAL DATA:  Chest pain. EXAM: PORTABLE CHEST 1 VIEW COMPARISON:  None. FINDINGS: Very mild linear atelectasis is seen within the left lung base. There is no  evidence of acute infiltrate, pleural effusion or pneumothorax. The heart size and mediastinal contours are within normal limits. Both lungs are clear. The visualized skeletal structures are unremarkable. IMPRESSION: No active disease. Electronically Signed   By: Virgina Norfolk M.D.   On: 09/15/2019 23:09    ECG & Cardiac Imaging    ECG shows atrial fibrillation with RVR - personally reviewed.  Assessment & Plan    Atrial fibrillation with RVR: first occurrence with spontaneous conversion on diltiazem/BB. Seems to be asymptomatic, so rate control is reasonable for first occurance. CHADS2-VASc 3 (4 next month). Has risk factors for OSA as well.  - Stop heparin in the morning and start Eliquis 5mg  BID  - Start diltiazem 120mg  PO and stop infusion - TTE in the morning - TSH pending - Referral for sleep study at discharge - Counseled on importance of weight loss and exercise  Severe uncontrolled hypertension: previously on metoprolol/amlodipine but this was discontinued by his PCP after lifestyle changes. Reports a recent BP at the dentist in the 123456 systolic. Sensitive to diltiazem. Likely contributing to AF and renal insufficiency.  - Start lisinopril 10mg  + HCTZ 25mg . Consolidate to combo pill at discharge - Starting diltiazem as above - Check renin/aldo (also hypokalemic)  Hypokalemia: likely due to Lasix, though hyperaldosteronism  is also very possible given his degree of hypertension - Check renin/aldosterone - Repletion with oral K  Renal insufficiency: GFR 51, no prior value for comparison, though BUN completely normal and has risk factors for CKD, most notably uncontrolled HTN.  - Repeat BMP in the morning  - Start lisinopril of Cr stable  Type 2 DM: diet controlled, unknown A1c - A1c and lipid panel. Likely needs to be on a statin. 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 No further work-up needed.    Nutrition: heart healthy DVT ppx: Eliquis Advanced Care Planning: Full Code Disposition: anticipate discharge today with conversion to sinus rhythm  Signed, Marykay Lex, MD 09/16/2019, 4:09 AM

## 2019-09-17 ENCOUNTER — Ambulatory Visit: Payer: Self-pay

## 2019-09-18 ENCOUNTER — Other Ambulatory Visit: Payer: Self-pay

## 2019-09-20 NOTE — Progress Notes (Signed)
Cardiology Office Note:    Date:  09/21/2019   ID:  Jeffrey Contreras, DOB 1946-04-24, MRN [id number]  PCP:  Geralynn Ochs, MD  Cardiologist:  Shirlee More, MD    Referring MD: No ref. provider found    ASSESSMENT:    1. Paroxysmal atrial fibrillation (HCC)   2. Chronic anticoagulation   3. Sinus bradycardia   4. Benign hypertension with chronic kidney disease   5. Hypokalemia   6. Type 2 diabetes mellitus without complication, with long-term current use of insulin ([place])   7. Overweight (BMI 25.0-29.9)    PLAN:    In order of problems listed above:  1. He has sick sinus syndrome resting bradycardia advice avoid rate slowing medications and has had 1 minimally symptomatic brief episode of atrial fibrillation.  We discussed the option of antiarrhythmic drug therapy versus clinical observation and he prefers the latter.  He will remain anticoagulated he will take a calcium channel blocker short acting as needed for symptomatic atrial fibrillation and he will purchase the watch her iPhone device to monitor his heart rhythm.  He has high healthcare literacy will follow up in the office in 4 weeks with me particularly regarding his blood pressure.  He agrees to continue anticoagulation.  If he required suppressive therapy I think low-dose amiodarone would be the best choice without a coincident beta-blocker or rate limiting calcium channel blocker that he would need with flecainide. 2. With his sick sinus syndrome and bradycardia we will stop his long-acting calcium channel blocker and to enhance hypertension control use hydralazine which should give him a little bit of reflux tachycardia with his beta-blocker eyedrops. 3. Controlled hypertension we will switch from ACE to high-dose high potency ARB and add hydralazine.  He is instructed in techniques for reliable blood pressure control will check twice daily and will reassess in the office if he requires an additional drug MRA would be  appropriate his recent potassium is normalized since discharge 4. Stable diabetes managed by his PCP 5. Control of atrial fibrillation advise lifestyle changes with 10% decrease in body mass and 150 minutes of to video a week.  Obviously I told him there is no more scaffold ladders or roof activities anticoagulated.   Next appointment: 1 month   Medication Adjustments/Labs and Tests Ordered: Current medicines are reviewed at length with the patient today.  Concerns regarding medicines are outlined above.  No orders of the defined types were placed in this encounter.  Meds ordered this encounter  Medications  . diltiazem (CARDIZEM) 30 MG tablet    Sig: Take 1 tablet (30 mg total) by mouth every 4 (four) hours as needed. For heart rate greater than 120 or in Atrial fib    Dispense:  60 tablet    Refill:  3  . telmisartan (MICARDIS) 40 MG tablet    Sig: Take 1 tablet (40 mg total) by mouth daily at 10 pm.    Dispense:  90 tablet    Refill:  1  . hydrALAZINE (APRESOLINE) 25 MG tablet    Sig: Take 0.5 tablets (12.5 mg total) by mouth 2 (two) times daily.    Dispense:  90 tablet    Refill:  1    Chief Complaint  Patient presents with  . Follow-up    With recent hospitalization  . Atrial Fibrillation  . Anticoagulation  . Hypertension  . Hyperlipidemia    History of Present Illness:    Jeffrey Contreras is a 74 y.o. male with a  hx of hypertension type 2 diabetes costochondral chest pain seen 09/15/2019 with palpitation found to be in atrial fibrillation.  He was last seen 09/16/2019 at hospital discharge.  He converted to sinus rhythm was transition from heparin to Eliquis for anticoagulant therapy and from intravenous to oral calcium channel blocker diltiazem.  High-sensitivity troponin was normal and BNP level was low level 169.  His CBC was normal he had CKD with a GFR of 51 cc/min and hypokalemia 3.2 lipid panel showed elevated LDL 149 and was initiated on statin therapy.  Testing  for acute COVID-19 infection normal.  EKG personally reviewed 09/15/2019 showed atrial fibrillation rapid ventricular rate nonspecific ST abnormality.  Compliance with diet, lifestyle and medications: Yes  He has a background history of chest pain and a normal ischemia evaluation remotely in [place] as well as resting bradycardia heart rates of 50-60 seen by EP and advised to avoid rate slowing medications.  In the past he had peripheral edema from amlodipine.  The day of admission the duration of atrial fibrillation was 4 hours or less and the only symptom he had is when he checked his pulse while he was at home he noticed it to be rapid and irregular.  He had no palpitation shortness of breath chest pain palpitation or syncope he tolerates his anticoagulant without bleeding and has had no neurologic event.  Home blood pressures have been consistently elevated 1 [id number] systolic diastolics [id number].  2 sequential blood pressure repeats by me in the office 180/94.  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  He is concerned about the potential of diltiazem exacerbating his bradycardia especially as he is a beta-blocker eyedrops. Past Medical History:  Diagnosis Date  . Atrial fibrillation ([place]) 09/2019  . Costochondritis    required injections  . Diabetes mellitus without complication ([place])   . Glaucoma   . Hypertension     Past Surgical History:  Procedure Laterality Date  . GLAUCOMA SURGERY    . MANDIBLE FRACTURE SURGERY    . TONSILLECTOMY      Current Medications: Current Meds  Medication Sig  . apixaban (ELIQUIS) 5 MG TABS tablet Take 5 mg by mouth 2 (two) times daily.  Marland Kitchen atorvastatin (LIPITOR) 40 MG tablet Take 1 tablet (40 mg total) by mouth daily at 6 PM.  . brimonidine-timolol (COMBIGAN) 0.2-0.5 % ophthalmic solution Place 1 drop into the left eye every 12 (twelve) hours.   . dorzolamide (TRUSOPT) 2 % ophthalmic  solution 1 drop 3 (three) times daily.  . hydrochlorothiazide (HYDRODIURIL) 25 MG tablet Take 1 tablet (25 mg total) by mouth daily.  . [DISCONTINUED] diltiazem (CARDIZEM CD) 120 MG 24 hr capsule Take 1 capsule (120 mg total) by mouth daily.  . [DISCONTINUED] dorzolamide (TRUSOPT) 2 % ophthalmic solution Place 1 drop into the left eye 3 (three) times daily.   . [DISCONTINUED] lisinopril (ZESTRIL) 10 MG tablet Take 10 mg by mouth daily.     Allergies:   Patient has no known allergies.   Social History   Socioeconomic History  . Marital status: Married    Spouse name: Not on file  . Number of children: Not on file  . Years of education: Not on file  . Highest education level: Not on file  Occupational History  . Not on file  Tobacco Use  . Smoking status: Never Smoker  . Smokeless tobacco: Never Used  Substance and Sexual Activity  .  Alcohol use: Yes    Comment: occ  . Drug use: Never  . Sexual activity: Yes  Other Topics Concern  . Not on file  Social History Narrative  . Not on file   Social Determinants of Health   Financial Resource Strain:   . Difficulty of Paying Living Expenses: Not on file  Food Insecurity:   . Worried About Charity fundraiser in the Last Year: Not on file  . Ran Out of Food in the Last Year: Not on file  Transportation Needs:   . Lack of Transportation (Medical): Not on file  . Lack of Transportation (Non-Medical): Not on file  Physical Activity:   . Days of Exercise per Week: Not on file  . Minutes of Exercise per Session: Not on file  Stress:   . Feeling of Stress : Not on file  Social Connections:   . Frequency of Communication with Friends and Family: Not on file  . Frequency of Social Gatherings with Friends and Family: Not on file  . Attends Religious Services: Not on file  . Active Member of Clubs or Organizations: Not on file  . Attends Archivist Meetings: Not on file  . Marital Status: Not on file     Family History:  The patient's family history includes Asthma in his father; Glaucoma in his father; Heart attack in his father. ROS:   Please see the history of present illness.    All other systems reviewed and are negative.  EKGs/Labs/Other Studies Reviewed:    The following studies were reviewed today:   Chest x-ray 09/15/2019 independently reviewed and is normal.  EKG at the hospital 09/15/2019 showed atrial fibrillation rapid ventricular response nonspecific ST changes personally reviewed  Echocardiogram 09/16/2019: Independently reviewed I agree with the report except that the ejection fraction is low normal to mildly reduced in the range of 50%.  The atria are normal in size.  1. Left ventricular ejection fraction, by visual estimation, is 50 to  55%. The left ventricle has low normal function. Left ventricular septal  wall thickness was mildly increased.  2. Left ventricular diastolic parameters are consistent with Grade I  diastolic dysfunction (impaired relaxation).  3. Global right ventricle has normal systolic function.The right  ventricular size is normal. Right vetricular wall thickness was not  assessed.  4. Left atrial size was normal.  5. Right atrial size was normal.  6. The mitral valve is normal in structure. Trivial mitral valve  regurgitation. No evidence of mitral stenosis.  7. The tricuspid valve is normal in structure. Tricuspid valve  regurgitation is trivial.  8. The aortic valve is tricuspid. Aortic valve regurgitation is trivial.  Mild aortic valve sclerosis without stenosis.  9. The pulmonic valve was normal in structure. Pulmonic valve  regurgitation is trivial.   Recent Labs: 09/15/2019: ALT 15; B Natriuretic Peptide 168.6; Hemoglobin 18.0; Magnesium 2.2; Platelets 268; TSH 1.816 09/16/2019: BUN 17; Creatinine, Ser 1.24; Potassium 5.0; Sodium 141  Recent Lipid Panel    Component Value Date/Time   CHOL 217 (H) 09/16/2019 0431   TRIG 132 09/16/2019 0431    HDL 42 09/16/2019 0431   CHOLHDL 5.2 09/16/2019 0431   VLDL 26 09/16/2019 0431   LDLCALC 149 (H) 09/16/2019 0431    Physical Exam:    VS:  BP (!) 180/94   Pulse 80   Temp (!) 97.2 F (36.2 C) (Oral)   Ht 5\' 9"  (1.753 m)   Wt 197 lb 12.8 oz (  89.7 kg)   SpO2 99%   BMI 29.21 kg/m     Wt Readings from Last 3 Encounters:  09/21/19 197 lb 12.8 oz (89.7 kg)  09/16/19 202 lb 6.4 oz (91.8 kg)     GEN:   in no acute distress HEENT: Normal NECK: No JVD; No carotid bruits LYMPHATICS: No lymphadenopathy CARDIAC: RRR, no murmurs, rubs, gallops RESPIRATORY:  Clear to auscultation without rales, wheezing or rhonchi  ABDOMEN: Soft, non-tender, non-distended MUSCULOSKELETAL:  No edema; No deformity  SKIN: Warm and dry NEUROLOGIC:  Alert and oriented x 3 PSYCHIATRIC:  Normal affect    Signed, Shirlee More, MD  09/21/2019 11:57 AM    [place]

## 2019-09-21 ENCOUNTER — Ambulatory Visit (INDEPENDENT_AMBULATORY_CARE_PROVIDER_SITE_OTHER): Payer: Medicare HMO | Admitting: Cardiology

## 2019-09-21 ENCOUNTER — Other Ambulatory Visit: Payer: Self-pay

## 2019-09-21 ENCOUNTER — Encounter: Payer: Self-pay | Admitting: Cardiology

## 2019-09-21 ENCOUNTER — Ambulatory Visit: Payer: Medicare HMO | Admitting: Cardiology

## 2019-09-21 VITALS — BP 180/94 | HR 80 | Temp 97.2°F | Ht 69.0 in | Wt 197.8 lb

## 2019-09-21 DIAGNOSIS — R001 Bradycardia, unspecified: Secondary | ICD-10-CM | POA: Diagnosis not present

## 2019-09-21 DIAGNOSIS — I129 Hypertensive chronic kidney disease with stage 1 through stage 4 chronic kidney disease, or unspecified chronic kidney disease: Secondary | ICD-10-CM

## 2019-09-21 DIAGNOSIS — Z7901 Long term (current) use of anticoagulants: Secondary | ICD-10-CM

## 2019-09-21 DIAGNOSIS — Z794 Long term (current) use of insulin: Secondary | ICD-10-CM

## 2019-09-21 DIAGNOSIS — I480 Paroxysmal atrial fibrillation: Secondary | ICD-10-CM

## 2019-09-21 DIAGNOSIS — E663 Overweight: Secondary | ICD-10-CM

## 2019-09-21 DIAGNOSIS — E119 Type 2 diabetes mellitus without complications: Secondary | ICD-10-CM

## 2019-09-21 DIAGNOSIS — E876 Hypokalemia: Secondary | ICD-10-CM

## 2019-09-21 MED ORDER — DILTIAZEM HCL 30 MG PO TABS: 30.0000 mg | ORAL_TABLET | ORAL | 3 refills | Status: DC | PRN

## 2019-09-21 MED ORDER — TELMISARTAN 40 MG PO TABS: 40.0000 mg | ORAL_TABLET | Freq: Every day | ORAL | 1 refills | Status: DC

## 2019-09-21 MED ORDER — HYDRALAZINE HCL 25 MG PO TABS: 12.5000 mg | ORAL_TABLET | Freq: Two times a day (BID) | ORAL | 1 refills | Status: DC

## 2019-09-21 NOTE — Patient Instructions (Addendum)
Medication Instructions:  Your physician has recommended you make the following change in your medication:   STOP: Diltiazem CD STOP: Lisinopril  START: Diltiazem 30 mg Take 1 tab every 4 hours as needed for heart rate greater than 120 or in Atrial fib  START: Telmisartan 40 mg TAke 1 tab every evening  START: Hydralazine 25 mg tabs Take 1/2 tab twice daily   CHECK BLOOD PRESSURE AT HOME DAILY   *If you need a refill on your cardiac medications before your next appointment, please call your pharmacy*  Lab Work: NOne If you have labs (blood work) drawn today and your tests are completely normal, you will receive your results only by: Marland Kitchen MyChart Message (if you have MyChart) OR . A paper copy in the mail If you have any lab test that is abnormal or we need to change your treatment, we will call you to review the results.  Testing/Procedures: NOne  Follow-Up: At [facility], you and your health needs are our priority.  As part of our continuing mission to provide you with exceptional heart care, we have created designated Provider Care Teams.  These Care Teams include your primary Cardiologist (physician) and Advanced Practice Providers (APPs -  Physician Assistants and Nurse Practitioners) who all work together to provide you with the care you need, when you need it.  Your next appointment:   4 week(s)  The format for your next appointment:   In Person  Provider:   Shirlee More, MD  Other Instructions Hydralazine tablets What is this medicine? HYDRALAZINE (hye DRAL a zeen) is a type of vasodilator. It relaxes blood vessels, increasing the blood and oxygen supply to your heart. This medicine is used to treat high blood pressure. This medicine may be used for other purposes; ask your health care provider or pharmacist if you have questions. COMMON BRAND NAME(S): Apresoline What should I tell my health care provider before I take this medicine? They need to know if you  have any of these conditions:  blood vessel disease  heart disease including angina or history of heart attack  kidney or liver disease  systemic lupus erythematosus (SLE)  an unusual or allergic reaction to hydralazine, tartrazine dye, other medicines, foods, dyes, or preservatives  pregnant or trying to get pregnant  breast-feeding How should I use this medicine? Take this medicine by mouth with a glass of water. Follow the directions on the prescription label. Take your doses at regular intervals. Do not take your medicine more often than directed. Do not stop taking except on the advice of your doctor or health care professional. Talk to your pediatrician regarding the use of this medicine in children. Special care may be needed. While this drug may be prescribed for children for selected conditions, precautions do apply. Overdosage: If you think you have taken too much of this medicine contact a poison control center or emergency room at once. NOTE: This medicine is only for you. Do not share this medicine with others. What if I miss a dose? If you miss a dose, take it as soon as you can. If it is almost time for your next dose, take only that dose. Do not take double or extra doses. What may interact with this medicine?  medicines for high blood pressure  medicines for mental depression This list may not describe all possible interactions. Give your health care provider a list of all the medicines, herbs, non-prescription drugs, or dietary supplements you use. Also tell them  if you smoke, drink alcohol, or use illegal drugs. Some items may interact with your medicine. What should I watch for while using this medicine? Visit your doctor or health care professional for regular checks on your progress. Check your blood pressure and pulse rate regularly. Ask your doctor or health care professional what your blood pressure and pulse rate should be and when you should contact him or  her. You may get drowsy or dizzy. Do not drive, use machinery, or do anything that needs mental alertness until you know how this medicine affects you. Do not stand or sit up quickly, especially if you are an older patient. This reduces the risk of dizzy or fainting spells. Alcohol may interfere with the effect of this medicine. Avoid alcoholic drinks. Do not treat yourself for coughs, colds, or pain while you are taking this medicine without asking your doctor or health care professional for advice. Some ingredients may increase your blood pressure. What side effects may I notice from receiving this medicine? Side effects that you should report to your doctor or health care professional as soon as possible:  chest pain, or fast or irregular heartbeat  fever, chills, or sore throat  numbness or tingling in the hands or feet  shortness of breath  skin rash, redness, blisters or itching  stiff or swollen joints  sudden weight gain  swelling of the feet or legs  swollen lymph glands  unusual weakness Side effects that usually do not require medical attention (report to your doctor or health care professional if they continue or are bothersome):  diarrhea, or constipation  headache  loss of appetite  nausea, vomiting This list may not describe all possible side effects. Call your doctor for medical advice about side effects. You may report side effects to FDA at 1-800-FDA-1088. Where should I keep my medicine? Keep out of the reach of children. Store at room temperature between 15 and 30 degrees C (59 and 86 degrees F). Throw away any unused medicine after the expiration date. NOTE: This sheet is a summary. It may not cover all possible information. If you have questions about this medicine, talk to your doctor, pharmacist, or health care provider.  2020 Elsevier/Gold Standard (2007-12-12 15:44:58) Telmisartan Tablets What is this medicine? TELMISARTAN (tel mi SAR tan) is an  angiotensin II receptor blocker, also known as an ARB. It treats high blood pressure. It may also be used to lower the risk of stroke or heart attack. This medicine may be used for other purposes; ask your health care provider or pharmacist if you have questions. COMMON BRAND NAME(S): Micardis What should I tell my health care provider before I take this medicine? They need to know if you have any of these conditions:  diet low in salt  kidney disease  liver disease  low levels of sodium in the blood  an unusual or allergic reaction to telmisartan, other medicines, foods, dyes, or preservatives  pregnant or trying to get pregnant  breast-feeding How should I use this medicine? Take this drug by mouth. Take it as directed on the prescription label at the same time every day. You can take it with or without food. If it upsets your stomach, take it with food. Keep taking it unless your health care provider tells you to stop. Talk to your health care provider about the use of this drug in children. Special care may be needed. Overdosage: If you think you have taken too much of this medicine contact  a poison control center or emergency room at once. NOTE: This medicine is only for you. Do not share this medicine with others. What if I miss a dose? If you miss a dose, take it as soon as you can. If it is almost time for your next dose, take only that dose. Do not take double or extra doses. What may interact with this medicine?  certain medicines for blood pressure, heart disease, irregular heart beat  diuretics  lithium  NSAIDs, medicines for pain and inflammation, like ibuprofen or naproxen This list may not describe all possible interactions. Give your health care provider a list of all the medicines, herbs, non-prescription drugs, or dietary supplements you use. Also tell them if you smoke, drink alcohol, or use illegal drugs. Some items may interact with your medicine. What should  I watch for while using this medicine? Visit your health care provider for regular checks on your progress. Check your blood pressure as directed. Ask your health care provider what your blood pressure should be. Also find out when you should contact him or her. Women should inform their health care provider if they wish to become pregnant or think they might be pregnant. There is a potential for serious side effects and harm to an unborn child, particularly in the second or third trimester. Talk to your health care provider for more information. You may get dizzy. Do not drive, use machinery, or do anything that needs mental alertness until you know how this drug affects you. Do not stand or sit up quickly, especially if you are an older patient. This reduces the risk of dizzy or fainting spells. Avoid alcoholic drinks; they can make you more dizzy. Avoid salt substitutes unless you are told otherwise by your health care provider. Do not treat yourself for coughs, colds, or pain while you are taking this drug without asking your health care provider for advice. Some drugs may increase your blood pressure. What side effects may I notice from receiving this medicine? Side effects that you should report to your doctor or health care professional as soon as possible:  allergic reactions (skin rash, itching or hives; swelling of the face, lips, or tongue)  high potassium levels (chest pain; fast, irregular heartbeat; muscle weakness)  kidney injury (trouble passing urine or change in the amount of urine)  low blood pressure (dizziness; feeling faint or lightheaded, falls; unusually weak or tired) Side effects that usually do not require medical attention (report to your doctor or health care professional if they continue or are bothersome):  back pain  change in sex drive or performance  diarrhea  headache  stuffy nose This list may not describe all possible side effects. Call your doctor for  medical advice about side effects. You may report side effects to FDA at 1-800-FDA-1088. Where should I keep my medicine? Keep out of the reach of children and pets. Store at room temperature between 15 and 30 degrees C (59 and 86 degrees F). Keep this drug in the original packaging until you are ready to take it. Throw away any unused drug after the expiration date. NOTE: This sheet is a summary. It may not cover all possible information. If you have questions about this medicine, talk to your doctor, pharmacist, or health care provider.  2020 Elsevier/Gold Standard (2019-05-11 14:00:59)                  [email or web address]  FDA-cleared, clinical grade mobile EKG monitor: Jeffrey Contreras is the most clinically-validated mobile EKG used by the world's leading cardiac care medical professionals With Basic service, know instantly if your heart rhythm is normal or if atrial fibrillation is detected, and email the last single EKG recording to yourself or your doctor Premium service, available for purchase through the Kardia app for $9.99 per month or $99 per year, includes unlimited history and storage of your EKG recordings, a monthly EKG summary report to share with your doctor, along with the ability to track your blood pressure, activity and weight Includes one KardiaMobile phone clip FREE SHIPPING: Standard delivery 1-3 business days. Orders placed by 11:00am PST will ship that afternoon. Otherwise, will ship next business day. All orders ship via [employer] from [place], [place], the new wearable EKG by [employer]. Vladimir Faster replaces your original Apple Watch band. The first of its kind, FDA-cleared KardiaBand provides accurate and instant analysis for detecting atrial fibrillation (AF) and normal sinus rhythm in an EKG. Simply place your thumb on the integrated KardiaBand sensor to take a  medical-grade EKG in just 30 seconds. Results appear instantly on your Apple Watch. Vladimir Faster is available today for just $199. KardiaBand features are designed exclusively for use with [employer]  - $99 year. The [employer] for [employer] includes [employer]'s revolutionary SmartRhythm monitoring feature. SmartRhythm monitoring uses an intelligent neural network that runs directly on the [employer], constantly acquiring data from the watch's heart rate sensor and its accelerometer. SmartRhythm compares your heart rate to what it expects from your minute-by-minute level of activity. When the network sees a pattern of heart rate and activity that it does not expect, it notifies you to take an EKG. With [place], peace of mind is just an EKG away.  Jeffrey Contreras  Tips to measure your blood pressure correctly  To determine whether you have hypertension, a medical professional will take a blood pressure reading. How you prepare for the test, the position of your arm, and other factors can change a blood pressure reading by 10% or more. That could be enough to hide high blood pressure, start you on a drug you don't really need, or lead your doctor to incorrectly adjust your medications. National and international guidelines offer specific instructions for measuring blood pressure. If a doctor, nurse, or medical assistant isn't doing it right, don't hesitate to ask him or her to get with the guidelines. Here's what you can do to ensure a correct reading: . Don't drink a caffeinated beverage or smoke during the 30 minutes before the test. . Sit quietly for five minutes before the test begins. . During the measurement, sit in a chair with your feet on the floor and your arm supported so your elbow is at about heart level. . The inflatable part of the cuff should completely cover at least 80% of your upper arm, and the cuff should be placed on bare skin, not over a shirt. . Don't talk  during the measurement. . Have your blood pressure measured twice, with a brief break in between. If the readings are different by 5 points or more, have it done a third time. There are times to break these rules. If you sometimes feel lightheaded when getting out of bed in the morning or when you stand after sitting, you should have your blood pressure checked while seated and then while standing to see if it  falls from one position to the next. Because blood pressure varies throughout the day, your doctor will rarely diagnose hypertension on the basis of a single reading. Instead, he or she will want to confirm the measurements on at least two occasions, usually within a few weeks of one another. The exception to this rule is if you have a blood pressure reading of 180/110 mm Hg or higher. A result this high usually calls for prompt treatment. It's also a good idea to have your blood pressure measured in both arms at least once, since the reading in one arm (usually the right) may be higher than that in the left. A 2014 study in The American Journal of Medicine of nearly 3,400 people found average arm- to-arm differences in systolic blood pressure of about 5 points. The higher number should be used to make treatment decisions. In 2017, new guidelines from the [employer], the [employer] of Cardiology, and nine other health organizations lowered the diagnosis of high blood pressure to 130/80 mm Hg or higher for all adults. The guidelines also redefined the various blood pressure categories to now include normal, elevated, Stage 1 hypertension, Stage 2 hypertension, and hypertensive crisis (see "Blood pressure categories"). Blood pressure categories  Blood pressure category SYSTOLIC (upper number)  DIASTOLIC (lower number)  Normal Less than 120 mm Hg and Less than 80 mm Hg  Elevated 120-129 mm Hg and Less than 80 mm Hg  High blood pressure: Stage 1 hypertension 130-139 mm Hg or 80-89  mm Hg  High blood pressure: Stage 2 hypertension 140 mm Hg or higher or 90 mm Hg or higher  Hypertensive crisis (consult your doctor immediately) Higher than 180 mm Hg and/or Higher than 120 mm Hg  Source: American Heart Association and American Stroke Association. For more on getting your blood pressure under control, buy Controlling Your Blood Pressure, a Special Health Report from [facility].DASH diet: Healthy eating to lower your blood pressure The DASH diet emphasizes portion size, eating a variety of foods and getting the right amount of nutrients. Discover how DASH can improve your health and lower your blood pressure. By [facility] Staff  DASH stands for Dietary Approaches to Stop Hypertension. The DASH diet is a lifelong approach to healthy eating that's designed to help treat or prevent high blood pressure (hypertension). The DASH diet encourages you to reduce the sodium in your diet and eat a variety of foods rich in nutrients that help lower blood pressure, such as potassium, calcium and magnesium. By following the DASH diet, you may be able to reduce your blood pressure by a few points in just two weeks. Over time, your systolic blood pressure could drop by eight to 14 points, which can make a significant difference in your health risks. Because the DASH diet is a healthy way of eating, it offers health benefits besides just lowering blood pressure. The DASH diet is also in line with dietary recommendations to prevent osteoporosis, cancer, heart disease, stroke and diabetes. DASH diet: Sodium levels The DASH diet emphasizes vegetables, fruits and low-fat dairy foods -- and moderate amounts of whole grains, fish, poultry and nuts. In addition to the standard DASH diet, there is also a lower sodium version of the diet. You can choose the version of the diet that meets your health needs: Standard DASH diet. You can consume up to 2,300 milligrams (mg) of sodium a day.  Lower  sodium DASH diet. You can consume up to 1,500 mg  of sodium a day. Both versions of the DASH diet aim to reduce the amount of sodium in your diet compared with what you might get in a typical American diet, which can amount to a whopping 3,400 mg of sodium a day or more. The standard DASH diet meets the recommendation from the Dietary Guidelines for Americans to keep daily sodium intake to less than 2,300 mg a day. The American Heart Association recommends 1,500 mg a day of sodium as an upper limit for all adults. If you aren't sure what sodium level is right for you, talk to your doctor. DASH diet: What to eat Both versions of the DASH diet include lots of whole grains, fruits, vegetables and low-fat dairy products. The DASH diet also includes some fish, poultry and legumes, and encourages a small amount of nuts and seeds a few times a week.  You can eat Contreras meat, sweets and fats in small amounts. The DASH diet is low in saturated fat, cholesterol and total fat. Here's a look at the recommended servings from each food group for the 2,000-calorie-a-day DASH diet. Grains: 6 to 8 servings a day Grains include bread, cereal, rice and pasta. Examples of one serving of grains include 1 slice whole-wheat bread, 1 ounce dry cereal, or 1/2 cup cooked cereal, rice or pasta. Focus on whole grains because they have more fiber and nutrients than do refined grains. For instance, use brown rice instead of white rice, whole-wheat pasta instead of regular pasta and whole-grain bread instead of white bread. Look for products labeled "100 percent whole grain" or "100 percent whole wheat."  Grains are naturally low in fat. Keep them this way by avoiding butter, cream and cheese sauces. Vegetables: 4 to 5 servings a day Tomatoes, carrots, broccoli, sweet potatoes, greens and other vegetables are full of fiber, vitamins, and such minerals as potassium and magnesium. Examples of one serving include 1 cup raw leafy green  vegetables or 1/2 cup cut-up raw or cooked vegetables. Don't think of vegetables only as side dishes -- a hearty blend of vegetables served over brown rice or whole-wheat noodles can serve as the main dish for a meal.  Fresh and frozen vegetables are both good choices. When buying frozen and canned vegetables, choose those labeled as low sodium or without added salt.  To increase the number of servings you fit in daily, be creative. In a stir-fry, for instance, cut the amount of meat in half and double up on the vegetables. Fruits: 4 to 5 servings a day Many fruits need little preparation to become a healthy part of a meal or snack. Like vegetables, they're packed with fiber, potassium and magnesium and are typically low in fat -- coconuts are an exception. Examples of one serving include one medium fruit, 1/2 cup fresh, frozen or canned fruit, or 4 ounces of juice. Have a piece of fruit with meals and one as a snack, then round out your day with a dessert of fresh fruits topped with a dollop of low-fat yogurt.  Leave on edible peels whenever possible. The peels of apples, pears and most fruits with pits add interesting texture to recipes and contain healthy nutrients and fiber.  Remember that citrus fruits and juices, such as grapefruit, can interact with certain medications, so check with your doctor or pharmacist to see if they're OK for you.  If you choose canned fruit or juice, make sure no sugar is added. Dairy: 2 to 3 servings a day Milk, yogurt,  cheese and other dairy products are major sources of calcium, vitamin D and protein. But the key is to make sure that you choose dairy products that are low fat or fat-free because otherwise they can be a major source of fat -- and most of it is saturated. Examples of one serving include 1 cup skim or 1 percent milk, 1 cup low fat yogurt, or 1 1/2 ounces part-skim cheese. Low-fat or fat-free frozen yogurt can help you boost the amount of dairy products  you eat while offering a sweet treat. Add fruit for a healthy twist.  If you have trouble digesting dairy products, choose lactose-free products or consider taking an over-the-counter product that contains the enzyme lactase, which can reduce or prevent the symptoms of lactose intolerance.  Go easy on regular and even fat-free cheeses because they are typically high in sodium. Lean meat, poultry and fish: 6 servings or fewer a day Meat can be a rich source of protein, B vitamins, iron and zinc. Choose lean varieties and aim for no more than 6 ounces a day. Cutting back on your meat portion will allow room for more vegetables. Trim away skin and fat from poultry and meat and then bake, broil, grill or roast instead of frying in fat.  Eat heart-healthy fish, such as salmon, herring and tuna. These types of fish are high in omega-3 fatty acids, which can help lower your total cholesterol. Nuts, seeds and legumes: 4 to 5 servings a week Almonds, sunflower seeds, kidney beans, peas, lentils and other foods in this family are good sources of magnesium, potassium and protein. They're also full of fiber and phytochemicals, which are plant compounds that may protect against some cancers and cardiovascular disease. Serving sizes are small and are intended to be consumed only a few times a week because these foods are high in calories. Examples of one serving include 1/3 cup nuts, 2 tablespoons seeds, or 1/2 cup cooked beans or peas.  Nuts sometimes get a bad rap because of their fat content, but they contain healthy types of fat -- monounsaturated fat and omega-3 fatty acids. They're high in calories, however, so eat them in moderation. Try adding them to stir-fries, salads or cereals.  Soybean-based products, such as tofu and tempeh, can be a good alternative to meat because they contain all of the amino acids your body needs to make a complete protein, just like meat. Fats and oils: 2 to 3 servings a day Fat  helps your body absorb essential vitamins and helps your body's immune system. But too much fat increases your risk of heart disease, diabetes and obesity. The DASH diet strives for a healthy balance by limiting total fat to less than 30 percent of daily calories from fat, with a focus on the healthier monounsaturated fats. Examples of one serving include 1 teaspoon soft margarine, 1 tablespoon mayonnaise or 2 tablespoons salad dressing. Saturated fat and trans fat are the main dietary culprits in increasing your risk of coronary artery disease. DASH helps keep your daily saturated fat to less than 6 percent of your total calories by limiting use of meat, butter, cheese, whole milk, cream and eggs in your diet, along with foods made from lard, solid shortenings, and palm and coconut oils.  Avoid trans fat, commonly found in such processed foods as crackers, baked goods and fried items.  Read food labels on margarine and salad dressing so that you can choose those that are lowest in saturated fat and free  of trans fat. Sweets: 5 servings or fewer a week You don't have to banish sweets entirely while following the DASH diet -- just go easy on them. Examples of one serving include 1 tablespoon sugar, jelly or jam, 1/2 cup sorbet, or 1 cup lemonade. When you eat sweets, choose those that are fat-free or low-fat, such as sorbets, fruit ices, jelly beans, hard candy, graham crackers or low-fat cookies.  Artificial sweeteners such as aspartame (NutraSweet, Equal) and sucralose (Splenda) may help satisfy your sweet tooth while sparing the sugar. But remember that you still must use them sensibly. It's OK to swap a diet cola for a regular cola, but not in place of a more nutritious beverage such as low-fat milk or even plain water.  Cut back on added sugar, which has no nutritional value but can pack on calories. DASH diet: Alcohol and caffeine Drinking too much alcohol can increase blood pressure. The Dietary  Guidelines for Americans recommends that men limit alcohol to no more than two drinks a day and women to one or less. The DASH diet doesn't address caffeine consumption. The influence of caffeine on blood pressure remains unclear. But caffeine can cause your blood pressure to rise at least temporarily. If you already have high blood pressure or if you think caffeine is affecting your blood pressure, talk to your doctor about your caffeine consumption. DASH diet and weight loss While the DASH diet is not a weight-loss program, you may indeed lose unwanted pounds because it can help guide you toward healthier food choices. The DASH diet generally includes about 2,000 calories a day. If you're trying to lose weight, you may need to eat fewer calories. You may also need to adjust your serving goals based on your individual circumstances -- something your health care team can help you decide. Tips to cut back on sodium The foods at the core of the DASH diet are naturally low in sodium. So just by following the DASH diet, you're likely to reduce your sodium intake. You also reduce sodium further by: Using sodium-free spices or flavorings with your food instead of salt  Not adding salt when cooking rice, pasta or hot cereal  Rinsing canned foods to remove some of the sodium  Buying foods labeled "no salt added," "sodium-free," "low sodium" or "very low sodium" One teaspoon of table salt has 2,325 mg of sodium. When you read food labels, you may be surprised at just how much sodium some processed foods contain. Even low-fat soups, canned vegetables, ready-to-eat cereals and sliced [place] from the local deli -- foods you may have considered healthy -- often have lots of sodium. You may notice a difference in taste when you choose low-sodium food and beverages. If things seem too bland, gradually introduce low-sodium foods and cut back on table salt until you reach your sodium goal. That'll give your palate time  to adjust. Using salt-free seasoning blends or herbs and spices may also ease the transition. It can take several weeks for your taste buds to get used to less salty foods. Putting the pieces of the DASH diet together Try these strategies to get started on the DASH diet:  Change gradually. If you now eat only one or two servings of fruits or vegetables a day, try to add a serving at lunch and one at dinner. Rather than switching to all whole grains, start by making one or two of your grain servings whole grains. Increasing fruits, vegetables and whole grains gradually can  also help prevent bloating or diarrhea that may occur if you aren't used to eating a diet with lots of fiber. You can also try over-the-counter products to help reduce gas from beans and vegetables.  Reward successes and forgive slip-ups. Reward yourself with a nonfood treat for your accomplishments -- rent a movie, purchase a book or get together with a friend. Everyone slips, especially when learning something new. Remember that changing your lifestyle is a long-term process. Find out what triggered your setback and then just pick up where you left off with the DASH diet.  Add physical activity. To boost your blood pressure lowering efforts even more, consider increasing your physical activity in addition to following the DASH diet. Combining both the DASH diet and physical activity makes it more likely that you'll reduce your blood pressure.  Get support if you need it. If you're having trouble sticking to your diet, talk to your doctor or dietitian about it. You might get some tips that will help you stick to the DASH diet. Remember, healthy eating isn't an all-or-nothing proposition. What's most important is that, on average, you eat healthier foods with plenty of variety -- both to keep your diet nutritious and to avoid boredom or extremes. And with the DASH diet, you can have both.  Atrial Fibrillation  Atrial fibrillation is a  type of heartbeat that is irregular or fast. If you have this condition, your heart beats without any order. This makes it hard for your heart to pump blood in a normal way. Atrial fibrillation may come and go, or it may become a long-lasting problem. If this condition is not treated, it can put you at higher risk for stroke, heart failure, and other heart problems. What are the causes? This condition may be caused by diseases that damage the heart. They include:  High blood pressure.  Heart failure.  Heart valve disease.  Heart surgery. Other causes include:  Diabetes.  Thyroid disease.  Being overweight.  Kidney disease. Sometimes the cause is not known. What increases the risk? You are more likely to develop this condition if:  You are older.  You smoke.  You exercise often and very hard.  You have a family history of this condition.  You are a man.  You use drugs.  You drink a lot of alcohol.  You have lung conditions, such as emphysema, pneumonia, or COPD.  You have sleep apnea. What are the signs or symptoms? Common symptoms of this condition include:  A feeling that your heart is beating very fast.  Chest pain or discomfort.  Feeling short of breath.  Suddenly feeling light-headed or weak.  Getting tired easily during activity.  Fainting.  Sweating. In some cases, there are no symptoms. How is this treated? Treatment for this condition depends on underlying conditions and how you feel when you have atrial fibrillation. They include:  Medicines to: ? Prevent blood clots. ? Treat heart rate or heart rhythm problems.  Using devices, such as a pacemaker, to correct heart rhythm problems.  Doing surgery to remove the part of the heart that sends bad signals.  Closing an area where clots can form in the heart (left atrial appendage). In some cases, your doctor will treat other underlying conditions. Follow these instructions at  home: Medicines  Take over-the-counter and prescription medicines only as told by your doctor.  Do not take any new medicines without first talking to your doctor.  If you are taking blood thinners: ?  Talk with your doctor before you take any medicines that have aspirin or NSAIDs, such as ibuprofen, in them. ? Take your medicine exactly as told by your doctor. Take it at the same time each day. ? Avoid activities that could hurt or bruise you. Follow instructions about how to prevent falls. ? Wear a bracelet that says you are taking blood thinners. Or, carry a card that lists what medicines you take. Lifestyle      Do not use any products that have nicotine or tobacco in them. These include cigarettes, e-cigarettes, and chewing tobacco. If you need help quitting, ask your doctor.  Eat heart-healthy foods. Talk with your doctor about the right eating plan for you.  Exercise regularly as told by your doctor.  Do not drink alcohol.  Lose weight if you are overweight.  Do not use drugs, including cannabis. General instructions  If you have a condition that causes breathing to stop for a short period of time (apnea), treat it as told by your doctor.  Keep a healthy weight. Do not use diet pills unless your doctor says they are safe for you. Diet pills may make heart problems worse.  Keep all follow-up visits as told by your doctor. This is important. Contact a doctor if:  You notice a change in the speed, rhythm, or strength of your heartbeat.  You are taking a blood-thinning medicine and you get more bruising.  You get tired more easily when you move or exercise.  You have a sudden change in weight. Get help right away if:   You have pain in your chest or your belly (abdomen).  You have trouble breathing.  You have side effects of blood thinners, such as blood in your vomit, poop (stool), or pee (urine), or bleeding that cannot stop.  You have any signs of a stroke.  "BE FAST" is an easy way to remember the main warning signs: ? B - Balance. Signs are dizziness, sudden trouble walking, or loss of balance. ? E - Eyes. Signs are trouble seeing or a change in how you see. ? F - Face. Signs are sudden weakness or loss of feeling in the face, or the face or eyelid drooping on one side. ? A - Arms. Signs are weakness or loss of feeling in an arm. This happens suddenly and usually on one side of the body. ? S - Speech. Signs are sudden trouble speaking, slurred speech, or trouble understanding what people say. ? T - Time. Time to call emergency services. Write down what time symptoms started.  You have other signs of a stroke, such as: ? A sudden, very bad headache with no known cause. ? Feeling like you may vomit (nausea). ? Vomiting. ? A seizure. These symptoms may be an emergency. Do not wait to see if the symptoms will go away. Get medical help right away. Call your local emergency services (911 in the [place] Do not drive yourself to the hospital. Summary  Atrial fibrillation is a type of heartbeat that is irregular or fast.  You are at higher risk of this condition if you smoke, are older, have diabetes, or are overweight.  Follow your doctor's instructions about medicines, diet, exercise, and follow-up visits.  Get help right away if you have signs or symptoms of a stroke.  Get help right away if you cannot catch your breath, or you have chest pain or discomfort. This information is not intended to replace advice given to you by  your health care provider. Make sure you discuss any questions you have with your health care provider. Document Revised: 01/21/2019 Document Reviewed: 01/21/2019 Elsevier Patient Education  [place].

## 2019-09-23 ENCOUNTER — Telehealth: Payer: Self-pay | Admitting: Emergency Medicine

## 2019-09-23 LAB — ALDOSTERONE + RENIN ACTIVITY W/ RATIO
ALDO / PRA Ratio: >200 — ABNORMAL HIGH (ref 0.0–30.0)
Aldosterone: 33.4 ng/dL — ABNORMAL HIGH (ref 0.0–30.0)
PRA LC/MS/MS: <0.167 ng/mL/hr — ABNORMAL LOW (ref 0.167–5.380)

## 2019-09-23 NOTE — Telephone Encounter (Signed)
TY 25 mg Inspra daily

## 2019-09-23 NOTE — Telephone Encounter (Signed)
Called patient informed him of resultsand reccomendations form lab work. Will confirm medication instructions with Dr. Bettina Gavia

## 2019-09-24 MED ORDER — EPLERENONE 25 MG PO TABS: 25.0000 mg | ORAL_TABLET | Freq: Every day | ORAL | 3 refills | Status: DC

## 2019-09-24 NOTE — Telephone Encounter (Signed)
Pt advised I am going to send his new RX for Inspra 25mg  to the [facility] he has listed. Pt agrees and appreciative for the call.

## 2019-09-24 NOTE — Addendum Note (Signed)
Addended by: Stephani Police on: 09/24/2019 11:39 AM   Modules accepted: Orders

## 2019-09-29 ENCOUNTER — Telehealth: Payer: Self-pay | Admitting: Cardiology

## 2019-09-29 NOTE — Telephone Encounter (Signed)
We are recommending the COVID-19 vaccine to all of our patients. Cardiac medications (including blood thinners) should not deter anyone from being vaccinated and there is no need to hold any of those medications prior to vaccine administration.     Currently, there is a hotline to call (active 08/21/19) to schedule vaccination appointments as no walk-ins will be accepted.   Number: [phone].    If an appointment is not available please go to [email or web address] to sign up for notification when additional vaccine appointments are available.   If you have further questions or concerns about the vaccine process, please visit [email or web address] or contact your primary care physician.   I have informed patient of instructions.   

## 2019-09-29 NOTE — Telephone Encounter (Signed)
Spoke with patient. Medication and problem list reviewed by Pharm D. Patient able to take Tylenol but should not take Advil or any NSAIDs due to Eliquis use. Patient verbalized understanding.

## 2019-09-29 NOTE — Telephone Encounter (Signed)
Patient states he is calling to inquire about whether or not he is eligible to take Tylenol medication. Please call to advise.

## 2019-10-18 NOTE — Progress Notes (Signed)
Cardiology Office Note:    Date:  10/19/2019   ID:  Jeffrey Contreras, DOB September 19, 1945, MRN [id number]  PCP:  Geralynn Ochs, MD  Cardiologist:  Shirlee More, MD    Referring MD: Geralynn Ochs, MD    ASSESSMENT:    1. Paroxysmal atrial fibrillation (HCC)   2. Chronic anticoagulation   3. Sinus bradycardia   4. Benign hypertension with chronic kidney disease    PLAN:    In order of problems listed above:  1. Stable sick sinus syndrome with one episode of atrial fibrillation and resting sinus bradycardia.  He will continue anticoagulation surveillance with the apple 6 watch with atrial fibrillation alert in the alive cor iPhone adapter we utilize a rate limiting calcium channel blocker if needed for rapid atrial fibrillation and if an antiarrhythmic drug is needed low-dose amiodarone be appropriate. 2. Continue his current anticoagulant no bleeding complication 3. Stable he is having no symptomatic bradycardia 4. Hypertension is well controlled and will recheck renal function and potassium with his distal diuretic and ARB.   Next appointment: 6 months   Medication Adjustments/Labs and Tests Ordered: Current medicines are reviewed at length with the patient today.  Concerns regarding medicines are outlined above.  No orders of the defined types were placed in this encounter.  No orders of the defined types were placed in this encounter.   Chief Complaint  Patient presents with  . Follow-up  . Atrial Fibrillation  . Hypertension    History of Present Illness:    Jeffrey Contreras is a 74 y.o. male with a hx of hypertension type 2 diabetes costochondral chest pain seen 09/15/2019 with palpitation found to be in atrial fibrillation.  He was seen 09/16/2019 at hospital discharge. He converted to sinus rhythm was transitioned from heparin to Eliquis for anticoagulant therapy and from intravenous to oral calcium channel blocker diltiazem. He has a background history of  chest pain and a normal ischemia evaluation remotely in [place] as well as resting bradycardia heart rates of 50-60 seen by EP and advised to avoid rate slowing medications.  In the past he had peripheral edema from amlodipine.  During his last visit a decision was made not initiate an antiarrhythmic drug and for him to purchase the iPhone adapter to monitor his heart rhythm and to use a short acting beta-blocker on a as needed basis.  He was continued on anticoagulation.   He was last seen 09/20/2018.  Compliance with diet, lifestyle and medications: Yes  He has high healthcare literacy and is quite invested in his own personal care.  He has the most current version of the apple watch and has had no alarms for atrial fibrillation and he gets rare episodes of nocturnal bradycardia less than 40.  His blood pressures consistently in the range of 120-130/60 70 heart rates run from 50 to the high 60s.  He is pleased with the quality of his life he has had no palpitation or syncope no chest pain or shortness of breath and tolerates his anticoagulant without any bleeding complication.  He has not needed to take short acting calcium channel blocker for rate control Past Medical History:  Diagnosis Date  . Atrial fibrillation ([place]) 09/2019  . Costochondritis    required injections  . Diabetes mellitus without complication ([place])   . Glaucoma   . Hypertension     Past Surgical History:  Procedure Laterality Date  . GLAUCOMA SURGERY    . MANDIBLE FRACTURE SURGERY    . TONSILLECTOMY  Current Medications: Current Meds  Medication Sig  . apixaban (ELIQUIS) 5 MG TABS tablet Take 5 mg by mouth 2 (two) times daily.  Marland Kitchen atorvastatin (LIPITOR) 40 MG tablet Take 1 tablet (40 mg total) by mouth daily at 6 PM.  . brimonidine-timolol (COMBIGAN) 0.2-0.5 % ophthalmic solution Place 1 drop into the left eye every 12 (twelve) hours.   Marland Kitchen diltiazem (CARDIZEM) 30 MG tablet Take 1 tablet (30 mg total) by mouth  every 4 (four) hours as needed. For heart rate greater than 120 or in Atrial fib  . dorzolamide (TRUSOPT) 2 % ophthalmic solution 1 drop 3 (three) times daily.  Marland Kitchen eplerenone (INSPRA) 25 MG tablet Take 1 tablet (25 mg total) by mouth daily.  . hydrALAZINE (APRESOLINE) 25 MG tablet Take 0.5 tablets (12.5 mg total) by mouth 2 (two) times daily.  . hydrochlorothiazide (HYDRODIURIL) 25 MG tablet Take 1 tablet (25 mg total) by mouth daily.  Marland Kitchen telmisartan (MICARDIS) 40 MG tablet Take 1 tablet (40 mg total) by mouth daily at 10 pm.     Allergies:   Patient has no known allergies.   Social History   Socioeconomic History  . Marital status: Married    Spouse name: Not on file  . Number of children: Not on file  . Years of education: Not on file  . Highest education level: Not on file  Occupational History  . Not on file  Tobacco Use  . Smoking status: Never Smoker  . Smokeless tobacco: Never Used  Substance and Sexual Activity  . Alcohol use: Yes    Comment: occ  . Drug use: Never  . Sexual activity: Yes  Other Topics Concern  . Not on file  Social History Narrative  . Not on file   Social Determinants of Health   Financial Resource Strain:   . Difficulty of Paying Living Expenses: Not on file  Food Insecurity:   . Worried About Charity fundraiser in the Last Year: Not on file  . Ran Out of Food in the Last Year: Not on file  Transportation Needs:   . Lack of Transportation (Medical): Not on file  . Lack of Transportation (Non-Medical): Not on file  Physical Activity:   . Days of Exercise per Week: Not on file  . Minutes of Exercise per Session: Not on file  Stress:   . Feeling of Stress : Not on file  Social Connections:   . Frequency of Communication with Friends and Family: Not on file  . Frequency of Social Gatherings with Friends and Family: Not on file  . Attends Religious Services: Not on file  . Active Member of Clubs or Organizations: Not on file  . Attends English as a second language teacher Meetings: Not on file  . Marital Status: Not on file     Family History: The patient's family history includes Asthma in his father; Glaucoma in his father; Heart attack in his father. ROS:   Please see the history of present illness.    All other systems reviewed and are negative.  EKGs/Labs/Other Studies Reviewed:    The following studies were reviewed today:  Reviewed his apple watch downloads with the patient his heart rate variability is to warnings for sinus rate less than 40 in the absence of any episodes of atrial fibrillation with the patient during the visit  Echocardiogram 09/16/2019: Independently reviewed I agree with the report except that the ejection fraction is low normal to mildly reduced in the range of 50%.  The atria are normal in size. 1. Left ventricular ejection fraction, by visual estimation, is 50 to  55%. The left ventricle has low normal function. Left ventricular septal  wall thickness was mildly increased.  2. Left ventricular diastolic parameters are consistent with Grade I  diastolic dysfunction (impaired relaxation).  3. Global right ventricle has normal systolic function.The right  ventricular size is normal. Right vetricular wall thickness was not  assessed.  4. Left atrial size was normal.  5. Right atrial size was normal.  6. The mitral valve is normal in structure. Trivial mitral valve  regurgitation. No evidence of mitral stenosis.  7. The tricuspid valve is normal in structure. Tricuspid valve  regurgitation is trivial.  8. The aortic valve is tricuspid. Aortic valve regurgitation is trivial.  Mild aortic valve sclerosis without stenosis.  9. The pulmonic valve was normal in structure. Pulmonic valve  regurgitation is trivial.  Recent Labs: 09/15/2019: ALT 15; B Natriuretic Peptide 168.6; Hemoglobin 18.0; Magnesium 2.2; Platelets 268; TSH 1.816 09/16/2019: BUN 17; Creatinine, Ser 1.24; Potassium 5.0; Sodium 141  Recent  Lipid Panel    Component Value Date/Time   CHOL 217 (H) 09/16/2019 0431   TRIG 132 09/16/2019 0431   HDL 42 09/16/2019 0431   CHOLHDL 5.2 09/16/2019 0431   VLDL 26 09/16/2019 0431   LDLCALC 149 (H) 09/16/2019 0431    Physical Exam:    VS:  BP (!) 146/80   Pulse 68   Temp (!) 96.8 F (36 C)   Ht 5\' 9"  (1.753 m)   Wt 193 lb (87.5 kg)   SpO2 99%   BMI 28.50 kg/m     Wt Readings from Last 3 Encounters:  10/19/19 193 lb (87.5 kg)  09/21/19 197 lb 12.8 oz (89.7 kg)  09/16/19 202 lb 6.4 oz (91.8 kg)     GEN:  Well nourished, well developed in no acute distress HEENT: Normal NECK: No JVD; No carotid bruits LYMPHATICS: No lymphadenopathy CARDIAC: RRR, no murmurs, rubs, gallops RESPIRATORY:  Clear to auscultation without rales, wheezing or rhonchi  ABDOMEN: Soft, non-tender, non-distended MUSCULOSKELETAL:  No edema; No deformity  SKIN: Warm and dry NEUROLOGIC:  Alert and oriented x 3 PSYCHIATRIC:  Normal affect    Signed, Shirlee More, MD  10/19/2019 4:16 PM    [facility]

## 2019-10-19 ENCOUNTER — Encounter: Payer: Self-pay | Admitting: Cardiology

## 2019-10-19 ENCOUNTER — Ambulatory Visit (INDEPENDENT_AMBULATORY_CARE_PROVIDER_SITE_OTHER): Payer: Medicare HMO | Admitting: Cardiology

## 2019-10-19 ENCOUNTER — Other Ambulatory Visit: Payer: Self-pay

## 2019-10-19 VITALS — BP 146/80 | HR 68 | Temp 96.8°F | Ht 69.0 in | Wt 193.0 lb

## 2019-10-19 DIAGNOSIS — R001 Bradycardia, unspecified: Secondary | ICD-10-CM | POA: Diagnosis not present

## 2019-10-19 DIAGNOSIS — Z7901 Long term (current) use of anticoagulants: Secondary | ICD-10-CM | POA: Diagnosis not present

## 2019-10-19 DIAGNOSIS — I480 Paroxysmal atrial fibrillation: Secondary | ICD-10-CM

## 2019-10-19 DIAGNOSIS — I129 Hypertensive chronic kidney disease with stage 1 through stage 4 chronic kidney disease, or unspecified chronic kidney disease: Secondary | ICD-10-CM

## 2019-10-19 MED ORDER — EPLERENONE 25 MG PO TABS: 25.0000 mg | ORAL_TABLET | Freq: Every day | ORAL | 3 refills | Status: DC

## 2019-10-19 NOTE — Patient Instructions (Signed)
Medication Instructions:  *If you need a refill on your cardiac medications before your next appointment, please call your pharmacy*  Lab Work: Your physician recommends that you have lab work today- BMET  If you have labs (blood work) drawn today and your tests are completely normal, you will receive your results only by: Marland Kitchen MyChart Message (if you have MyChart) OR . A paper copy in the mail If you have any lab test that is abnormal or we need to change your treatment, we will call you to review the results.  Follow-Up: At [facility], you and your health needs are our priority.  As part of our continuing mission to provide you with exceptional heart care, we have created designated Provider Care Teams.  These Care Teams include your primary Cardiologist (physician) and Advanced Practice Providers (APPs -  Physician Assistants and Nurse Practitioners) who all work together to provide you with the care you need, when you need it.  We recommend signing up for the patient portal called "MyChart".  Sign up information is provided on this After Visit Summary.  MyChart is used to connect with patients for Virtual Visits (Telemedicine).  Patients are able to view lab/test results, encounter notes, upcoming appointments, etc.  Non-urgent messages can be sent to your provider as well.   To learn more about what you can do with MyChart, go to [email or web address].    Your next appointment:   6 month(s)  The format for your next appointment:   In Person  Provider:   You will see Shirlee More, MD.  Or, you can be scheduled with the following Advanced Practice Provider on your designated Care Team (at our [facility]):  Laurann Montana, FNP

## 2019-10-20 ENCOUNTER — Telehealth: Payer: Self-pay

## 2019-10-20 LAB — BASIC METABOLIC PANEL
BUN/Creatinine Ratio: 17 (ref 10–24)
BUN: 20 mg/dL (ref 8–27)
CO2: 23 mmol/L (ref 20–29)
Calcium: 9.9 mg/dL (ref 8.6–10.2)
Chloride: 102 mmol/L (ref 96–106)
Creatinine, Ser: 1.15 mg/dL (ref 0.76–1.27)
GFR calc Af Amer: 72 mL/min/{1.73_m2} (ref >59)
GFR calc non Af Amer: 62 mL/min/{1.73_m2} (ref >59)
Glucose: 86 mg/dL (ref 65–99)
Potassium: 4.3 mmol/L (ref 3.5–5.2)
Sodium: 142 mmol/L (ref 134–144)

## 2019-10-20 NOTE — Telephone Encounter (Signed)
The patient has been notified of the lab result and verbalized understanding.  All questions (if any) were answered. Frederik Schmidt, RN 10/20/2019 9:19 AM

## 2019-10-20 NOTE — Telephone Encounter (Signed)
-----   Message from Richardo Priest, MD sent at 10/20/2019  7:49 AM EST ----- Normal or stable result  No changes

## 2020-01-04 ENCOUNTER — Telehealth: Payer: Self-pay | Admitting: Cardiology

## 2020-01-04 NOTE — Telephone Encounter (Signed)
Pt c/o swelling: STAT is pt has developed SOB within 24 hours  1) How much weight have you gained and in what time span?he does not think he have any weight gain  2) If swelling, where is the swelling located? Knee all way to his ankles- feels a lot of pressure in his legs- last night blood pressure was low, it was 110/67 and pulse was 59, today this afternoon it was 130/70 and pulse was 57  3) Are you currently taking a fluid pill? yes  4) Are you currently SOB? no  5) Do you have a log of your daily weights (if so, list)?noave you gained 3 pounds in a day or 5 pounds in a week?  6) Have you traveled recently?no

## 2020-01-04 NOTE — Telephone Encounter (Signed)
He needs office appt and EKG

## 2020-01-05 ENCOUNTER — Other Ambulatory Visit: Payer: Self-pay

## 2020-01-05 NOTE — Telephone Encounter (Signed)
Spoke to patients wife just now and let her know that Dr. Bettina Gavia is wanting to have the patient come in for an office visit and to have an EKG done. She understands this and we set up an appointment for him for 01/06/20.    Encouraged patient to call back with any questions or concerns.

## 2020-01-05 NOTE — Progress Notes (Signed)
Cardiology Office Note:    Date:  01/06/2020   ID:  Jeffrey Contreras, DOB May 28, 1946, MRN [id number]  PCP:  Geralynn Ochs, MD  Cardiologist:  Shirlee More, MD    Referring MD: Geralynn Ochs, MD    ASSESSMENT:    1. Lower extremity edema   2. Paroxysmal atrial fibrillation (HCC)   3. Chronic anticoagulation   4. Sinus bradycardia   5. Benign hypertension with chronic kidney disease    PLAN:    In order of problems listed above:  1. His chronic venous insufficiency my index suspicion of heart failure is very low.  I given him reassurance and did ask him to have labs done in 2 weeks to look at potassium N-terminal proBNP level.  I do not think we need to repeat cardiac imaging like echocardiogram 2. Stable no recurrence of atrial fibrillation because of his bradycardia they do not have suppressant therapy and he takes  calcium channel blocker as needed 3. Stable continue his anticoagulant.  Helpful labs performed in July including a CBC 4. Stable asymptomatic 5. BP at target continue current treatment none of which is rate slowing. 6. Hyperlipidemia stable continue statin   Next appointment: As previously scheduled   Medication Adjustments/Labs and Tests Ordered: Current medicines are reviewed at length with the patient today.  Concerns regarding medicines are outlined above.  No orders of the defined types were placed in this encounter.  No orders of the defined types were placed in this encounter.   Chief Complaint  Patient presents with  . Follow-up  . Leg Swelling    History of Present Illness:    Jeffrey Contreras is a 74 y.o. male with a hx of hypertension type 2 diabetes costochondral chest pain seen 09/15/2019 with palpitation found to be in atrial fibrillation.  He was seen 09/16/2019 at hospital discharge. He converted to sinus rhythm was transitioned from heparin to Eliquis for anticoagulant therapy and from intravenous to oral calcium channel blocker  diltiazem. He has a background history of chest pain and a normal ischemia evaluation remotely in [place] as well as resting bradycardia heart rates of 50-60 seen by EP and advised to avoid rate slowing medications.  He had an echocardiogram performed in February 2021 which showed low normal ejection fraction 50 to 55% impaired relaxation and normal left ventricular filling pressure. He was last seen 10/19/2019 and has no history of heart failure.  He is seen today as a work-in after a call to the office with increasing edema.  Compliance with diet, lifestyle and medications: Yes  Overall he is doing well he is exercising he said no recurrent atrial fibrillation he has no orthopnea shortness of breath his weight is down 9 pounds.  He tells me in the past he has had intermittent dependent edema shows me pictures where it is gone in the morning they are in the evening and recently is a little worse although sodium restricted.  He has never taken a loop diuretic.  His EKG confirms he is in sinus rhythm and is not needed to take his calcium channel blocker.  He has typical findings of dependent edema chronic venous insufficiency I given a prescription for low-dose furosemide that he can take as needed.  He has arrangements for a yearly physical with his primary care physician in [place] end of July and bring him back in 2 weeks we will check an N-terminal proBNP level electrolytes including potassium but I do not think that he has congestive heart  failure. Past Medical History:  Diagnosis Date  . Atrial fibrillation ([place]) 09/2019  . Atrial fibrillation with RVR ([place]) 09/16/2019  . Benign paroxysmal positional vertigo of left ear 01/21/2018   Hallpike positive left ear down for rotational nystagmus.  Formatting of this note might be different from the original. Hallpike positive left ear down for rotational nystagmus.  . 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 Costochondritis    required injections  . Diabetes mellitus without complication ([place])   . Dysfunction of left eustachian tube 02/11/2017   Last Assessment & Plan:  Follow-up persistent fullness in the ears. Continues with fullness in the ears with intermittent popping.  Causes intermittent unsteadiness.  Presents to discuss ventilation tube placement. EXAMINATION reveals normal external canals and tympanic membranes. PLAN: I am not sure if this is more an inner ear or middle ear problem.  We discussed diagnostic myringotomy to better  . Essential hypertension 09/16/2019  . Glaucoma   . Hypertension   . Hypokalemia 09/16/2019  . 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 Renal insufficiency 09/16/2019    Past Surgical History:  Procedure Laterality Date  . GLAUCOMA SURGERY    . MANDIBLE FRACTURE SURGERY    . TONSILLECTOMY      Current Medications: Current Meds  Medication Sig  . apixaban (ELIQUIS) 5 MG TABS tablet Take 5 mg by mouth 2 (two) times daily.  Marland Kitchen atorvastatin (LIPITOR) 40 MG tablet Take 1 tablet (40 mg total) by mouth daily at 6 PM.  . brimonidine-timolol (COMBIGAN) 0.2-0.5 % ophthalmic solution Place 1 drop into both eyes 2 (two) times daily.  Marland Kitchen diltiazem (CARDIZEM) 30 MG tablet Take 1 tablet (30 mg total) by mouth every 4 (four) hours as needed. For heart rate greater than 120 or in Atrial fib  . dorzolamide (TRUSOPT) 2 % ophthalmic solution 1 drop 3 (three) times daily.  Marland Kitchen eplerenone (INSPRA) 25 MG tablet Take 1 tablet (25 mg total) by mouth daily.  . hydrochlorothiazide (HYDRODIURIL) 25 MG tablet Take 1 tablet (25 mg total) by mouth daily.  Marland Kitchen telmisartan (MICARDIS) 40 MG tablet Take 1 tablet (40 mg total) by mouth daily at 10 pm.     Allergies:   Patient has no known allergies.   Social History   Socioeconomic History  . Marital status: Married    Spouse name: Not on file  . Number of children: Not on file  . Years of education: Not on file  . Highest education level:  Not on file  Occupational History  . Not on file  Tobacco Use  . Smoking status: Never Smoker  . Smokeless tobacco: Never Used  Substance and Sexual Activity  . Alcohol use: Yes    Comment: occ  . Drug use: Never  . Sexual activity: Yes  Other Topics Concern  . Not on file  Social History Narrative  . Not on file   Social Determinants of Health   Financial Resource Strain:   . Difficulty of Paying Living Expenses:   Food Insecurity:   . Worried About Charity fundraiser in the Last Year:   . Arboriculturist in the Last Year:   Transportation Needs:   . Film/video editor (Medical):   Marland Kitchen Lack of Transportation (Non-Medical):   Physical Activity:   . Days of Exercise per Week:   . Minutes of Exercise per Session:   Stress:   . Feeling of Stress :  Social Connections:   . Frequency of Communication with Friends and Family:   . Frequency of Social Gatherings with Friends and Family:   . Attends Religious Services:   . Active Member of Clubs or Organizations:   . Attends Archivist Meetings:   Marland Kitchen Marital Status:      Family History: The patient's family history includes Asthma in his father; Glaucoma in his father; Heart attack in his father. ROS:   Please see the history of present illness.    All other systems reviewed and are negative.  EKGs/Labs/Other Studies Reviewed:    The following studies were reviewed today:  EKG:  EKG ordered today and personally reviewed.  The ekg ordered today demonstrates confirms he is in sinus rhythm rate of 61 first-degree AV block otherwise normal  Recent Labs: 09/15/2019: ALT 15; B Natriuretic Peptide 168.6; Hemoglobin 18.0; Magnesium 2.2; Platelets 268; TSH 1.816 10/19/2019: BUN 20; Creatinine, Ser 1.15; Potassium 4.3; Sodium 142  Recent Lipid Panel    Component Value Date/Time   CHOL 217 (H) 09/16/2019 0431   TRIG 132 09/16/2019 0431   HDL 42 09/16/2019 0431   CHOLHDL 5.2 09/16/2019 0431   VLDL 26 09/16/2019 0431    LDLCALC 149 (H) 09/16/2019 0431    Physical Exam:    VS:  BP 138/80   Pulse 61   Ht 5\' 9"  (1.753 m)   Wt 184 lb (83.5 kg)   SpO2 99%   BMI 27.17 kg/m     Wt Readings from Last 3 Encounters:  01/06/20 184 lb (83.5 kg)  10/19/19 193 lb (87.5 kg)  09/21/19 197 lb 12.8 oz (89.7 kg)     GEN:  Well nourished, well developed in no acute distress HEENT: Normal NECK: No JVD; No carotid bruits LYMPHATICS: No lymphadenopathy CARDIAC: RRR, no murmurs, rubs, gallops RESPIRATORY:  Clear to auscultation without rales, wheezing or rhonchi  ABDOMEN: Soft, non-tender, non-distended MUSCULOSKELETAL: He has 1+  pitting edema predominantly at the ankles edema with stasis changes; No deformity  SKIN: Warm and dry NEUROLOGIC:  Alert and oriented x 3 PSYCHIATRIC:  Normal affect    Signed, Shirlee More, MD  01/06/2020 8:43 AM    [facility]

## 2020-01-06 ENCOUNTER — Encounter: Payer: Self-pay | Admitting: Cardiology

## 2020-01-06 ENCOUNTER — Other Ambulatory Visit: Payer: Self-pay

## 2020-01-06 ENCOUNTER — Ambulatory Visit: Payer: Medicare HMO | Admitting: Cardiology

## 2020-01-06 VITALS — BP 138/80 | HR 61 | Ht 69.0 in | Wt 184.0 lb

## 2020-01-06 DIAGNOSIS — I129 Hypertensive chronic kidney disease with stage 1 through stage 4 chronic kidney disease, or unspecified chronic kidney disease: Secondary | ICD-10-CM

## 2020-01-06 DIAGNOSIS — R600 Localized edema: Secondary | ICD-10-CM

## 2020-01-06 DIAGNOSIS — R001 Bradycardia, unspecified: Secondary | ICD-10-CM

## 2020-01-06 DIAGNOSIS — Z7901 Long term (current) use of anticoagulants: Secondary | ICD-10-CM | POA: Diagnosis not present

## 2020-01-06 DIAGNOSIS — I480 Paroxysmal atrial fibrillation: Secondary | ICD-10-CM | POA: Diagnosis not present

## 2020-01-06 MED ORDER — FUROSEMIDE 20 MG PO TABS: 20.0000 mg | ORAL_TABLET | ORAL | 3 refills | Status: DC

## 2020-01-06 NOTE — Patient Instructions (Signed)
Medication Instructions:  Your physician has recommended you make the following change in your medication:  START: Furosemide 20 mg take once or twice weekly as needed for edema.  *If you need a refill on your cardiac medications before your next appointment, please call your pharmacy*   Lab Work: Your physician recommends that you return for lab work in: [place] If you have labs (blood work) drawn today and your tests are completely normal, you will receive your results only by: Marland Kitchen MyChart Message (if you have MyChart) OR . A paper copy in the mail If you have any lab test that is abnormal or we need to change your treatment, we will call you to review the results.   Testing/Procedures: None   Follow-Up: At [facility], you and your health needs are our priority.  As part of our continuing mission to provide you with exceptional heart care, we have created designated Provider Care Teams.  These Care Teams include your primary Cardiologist (physician) and Advanced Practice Providers (APPs -  Physician Assistants and Nurse Practitioners) who all work together to provide you with the care you need, when you need it.  We recommend signing up for the patient portal called "MyChart".  Sign up information is provided on this After Visit Summary.  MyChart is used to connect with patients for Virtual Visits (Telemedicine).  Patients are able to view lab/test results, encounter notes, upcoming appointments, etc.  Non-urgent messages can be sent to your provider as well.   To learn more about what you can do with MyChart, go to [email or web address].    Your next appointment:   As previously scheduled on 04/25/2020  The format for your next appointment:   In Person  Provider:   Shirlee More, MD   Other Instructions

## 2020-01-21 LAB — BASIC METABOLIC PANEL
BUN/Creatinine Ratio: 19 (ref 10–24)
BUN: 17 mg/dL (ref 8–27)
CO2: 24 mmol/L (ref 20–29)
Calcium: 9.2 mg/dL (ref 8.6–10.2)
Chloride: 104 mmol/L (ref 96–106)
Creatinine, Ser: 0.89 mg/dL (ref 0.76–1.27)
GFR calc Af Amer: 97 mL/min/{1.73_m2} (ref >59)
GFR calc non Af Amer: 84 mL/min/{1.73_m2} (ref >59)
Glucose: 117 mg/dL — ABNORMAL HIGH (ref 65–99)
Potassium: 4.2 mmol/L (ref 3.5–5.2)
Sodium: 139 mmol/L (ref 134–144)

## 2020-01-21 LAB — PRO B NATRIURETIC PEPTIDE: NT-Pro BNP: 188 pg/mL (ref 0–486)

## 2020-03-14 ENCOUNTER — Emergency Department (HOSPITAL_BASED_OUTPATIENT_CLINIC_OR_DEPARTMENT_OTHER): Payer: Medicare HMO

## 2020-03-14 ENCOUNTER — Emergency Department (HOSPITAL_BASED_OUTPATIENT_CLINIC_OR_DEPARTMENT_OTHER)
Admission: EM | Admit: 2020-03-14 | Discharge: 2020-03-14 | Disposition: A | Discharge status: Home / Self Care | Payer: Medicare HMO | Attending: Emergency Medicine | Admitting: Emergency Medicine

## 2020-03-14 ENCOUNTER — Other Ambulatory Visit: Payer: Self-pay

## 2020-03-14 ENCOUNTER — Encounter (HOSPITAL_BASED_OUTPATIENT_CLINIC_OR_DEPARTMENT_OTHER): Payer: Self-pay | Admitting: Emergency Medicine

## 2020-03-14 DIAGNOSIS — W01198A Fall on same level from slipping, tripping and stumbling with subsequent striking against other object, initial encounter: Secondary | ICD-10-CM | POA: Insufficient documentation

## 2020-03-14 DIAGNOSIS — M545 Low back pain, unspecified: Secondary | ICD-10-CM

## 2020-03-14 DIAGNOSIS — Z79899 Other long term (current) drug therapy: Secondary | ICD-10-CM | POA: Diagnosis not present

## 2020-03-14 DIAGNOSIS — S9031XA Contusion of right foot, initial encounter: Secondary | ICD-10-CM | POA: Insufficient documentation

## 2020-03-14 DIAGNOSIS — E119 Type 2 diabetes mellitus without complications: Secondary | ICD-10-CM | POA: Insufficient documentation

## 2020-03-14 DIAGNOSIS — Y9353 Activity, golf: Secondary | ICD-10-CM | POA: Diagnosis not present

## 2020-03-14 DIAGNOSIS — W19XXXA Unspecified fall, initial encounter: Secondary | ICD-10-CM

## 2020-03-14 DIAGNOSIS — Y998 Other external cause status: Secondary | ICD-10-CM | POA: Insufficient documentation

## 2020-03-14 DIAGNOSIS — Y9239 Other specified sports and athletic area as the place of occurrence of the external cause: Secondary | ICD-10-CM | POA: Insufficient documentation

## 2020-03-14 DIAGNOSIS — I10 Essential (primary) hypertension: Secondary | ICD-10-CM | POA: Diagnosis not present

## 2020-03-14 DIAGNOSIS — M79671 Pain in right foot: Secondary | ICD-10-CM | POA: Diagnosis present
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
## 2020-03-14 NOTE — Discharge Instructions (Addendum)
Apply ice and elevate foot for 20 minutes at a time for pain and swelling. Apply ice to left hip area as needed for 20 minutes at a time. Take Tylenol as needed as directed. Recheck with your doctor later this week.

## 2020-03-19 ENCOUNTER — Other Ambulatory Visit: Payer: Self-pay | Admitting: Cardiology

## 2020-03-19 ENCOUNTER — Other Ambulatory Visit: Payer: Self-pay | Admitting: Medical

## 2020-03-21 ENCOUNTER — Other Ambulatory Visit: Payer: Self-pay | Admitting: Cardiology

## 2020-03-21 MED ORDER — HYDROCHLOROTHIAZIDE 25 MG PO TABS: 25.0000 mg | ORAL_TABLET | Freq: Every day | ORAL | 3 refills | Status: DC

## 2020-03-21 MED ORDER — TELMISARTAN 40 MG PO TABS: 40.0000 mg | ORAL_TABLET | Freq: Every day | ORAL | 3 refills | Status: DC

## 2020-03-21 NOTE — Telephone Encounter (Signed)
 *  STAT* If patient is at the pharmacy, call can be transferred to refill team.   1. Which medications need to be refilled? (please list name of each medication and dose if known)   hydrochlorothiazide (HYDRODIURIL) 25 MG tablet telmisartan (MICARDIS) 40 MG tablet  2. Which pharmacy/location (including street and city if local pharmacy) is medication to be sent to?  [facility] - [place], [place] - [address]  3. Do they need a 30 day or 90 day supply? 90 day  Patient is completely out of medication

## 2020-03-21 NOTE — Telephone Encounter (Signed)
Refill sent in per request.  

## 2020-03-21 NOTE — Telephone Encounter (Signed)
*  STAT* If patient is at the pharmacy, call can be transferred to refill team.   1. Which medications need to be refilled? (please list name of each medication and dose if known)   hydrALAZINE (APRESOLINE) 25 MG tablet    2. Which pharmacy/location (including street and city if local pharmacy) is medication to be sent to? [facility] - [place], [place] - [address]  3. Do they need a 30 day or 90 day supply? 30 day supply  Patient's wife states the patient only has 1 tablet remaining and he needs enough medication to last him until his appointment scheduled for 04/25/20 with Dr. Bettina Gavia.

## 2020-03-22 MED ORDER — HYDRALAZINE HCL 25 MG PO TABS: 12.5000 mg | ORAL_TABLET | Freq: Two times a day (BID) | ORAL | 1 refills | Status: DC

## 2020-03-22 NOTE — Telephone Encounter (Signed)
Refills sent in per request 

## 2020-03-22 NOTE — Addendum Note (Signed)
Addended by: Resa Miner I on: 03/22/2020 07:55 AM   Modules accepted: Orders

## 2020-03-23 ENCOUNTER — Other Ambulatory Visit: Payer: Self-pay | Admitting: Medical

## 2020-04-24 NOTE — Progress Notes (Signed)
Cardiology Office Note:    Date:  04/25/2020   ID:  Jeffrey Contreras, DOB Apr 26, 1946, MRN [id number]  PCP:  Geralynn Ochs, MD  Cardiologist:  Shirlee More, MD    Referring MD: Geralynn Ochs, MD    ASSESSMENT:    1. Paroxysmal atrial fibrillation (HCC)   2. Chronic anticoagulation   3. Sinus bradycardia   4. Benign hypertension with chronic kidney disease   5. Hypokalemia   6. Lower extremity edema   7. Mixed hyperlipidemia    PLAN:    In order of problems listed above:  1. He is done quite well maintained sinus rhythm and has no significant bradycardia has not needed Cardizem.  He will continue his current anticoagulant. 2. Stable he can continue his low-dose beta-blocker eyedrop 3. BP at target continue current multidrug regimen including hydrochlorothiazide Inspra telmisartan and hydralazine.  Home blood pressures consistently 120-130/70-80. 4. Improved resolved potassium normal 5. Improved he has no edema today 6. Lipids are at target continue statin   Next appointment: 1 year   Medication Adjustments/Labs and Tests Ordered: Current medicines are reviewed at length with the patient today.  Concerns regarding medicines are outlined above.  No orders of the defined types were placed in this encounter.  No orders of the defined types were placed in this encounter.   Chief Complaint  Patient presents with  . Follow-up    Lower extremity edema  . Atrial Fibrillation  . Anticoagulation    History of Present Illness:    Jeffrey Contreras is a 74 y.o. male with a hx of lower extremity edema, chronic venous insufficiency paroxysmal atrial fibrillation with chronic anticoagulation type 2 diabetes and hypertension last seen 01/06/2020. He has a background history of chest pain and a normal ischemia evaluation remotely in [place] as well as resting bradycardia heart rates of 50-60 seen by EP and advised to avoid rate slowing medications.  He had an  echocardiogram performed in February 2021 which showed low normal ejection fraction 50 to 55% impaired relaxation and normal left ventricular filling pressure.   Compliance with diet, lifestyle and medications: yes  His proBNP level performed 01/20/2020 was quite low at 188 not consistent with heart failure. Echocardiogram performed 09/16/2019 showed low normal ejection fraction 50 to 55% impaired relaxation with normal filling pressures both the left and right atrium were normal in size and no significant valvular abnormality. Past Medical History:  Diagnosis Date  . Atrial fibrillation ([place]) 09/2019  . Atrial fibrillation with RVR ([place]) 09/16/2019  . Benign paroxysmal positional vertigo of left ear 01/21/2018   Hallpike positive left ear down for rotational nystagmus.  Formatting of this note might be different from the original. Hallpike positive left ear down for rotational nystagmus.  . 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 Costochondritis    required injections  . Diabetes mellitus without complication ([place])   . Dysfunction of left eustachian tube 02/11/2017   Last Assessment & Plan:  Follow-up persistent fullness in the ears. Continues with fullness in the ears with intermittent popping.  Causes intermittent unsteadiness.  Presents to discuss ventilation tube placement. EXAMINATION reveals normal external canals and tympanic membranes. PLAN: I am not sure if this is more an inner ear or middle ear problem.  We discussed diagnostic myringotomy to better  . Essential hypertension 09/16/2019  . Glaucoma   . Hypertension   . Hypokalemia 09/16/2019  . 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 Renal insufficiency 09/16/2019  Past Surgical History:  Procedure Laterality Date  . GLAUCOMA SURGERY    . MANDIBLE FRACTURE SURGERY    . TONSILLECTOMY      Current Medications: Current Meds  Medication Sig  . apixaban (ELIQUIS) 5 MG TABS tablet  Take 5 mg by mouth 2 (two) times daily.  Marland Kitchen atorvastatin (LIPITOR) 40 MG tablet TAKE 1 TABLET BY MOUTH ONCE DAILY AT  6  PM  . brimonidine-timolol (COMBIGAN) 0.2-0.5 % ophthalmic solution Place 1 drop into both eyes 2 (two) times daily.  Marland Kitchen diltiazem (CARDIZEM) 30 MG tablet Take 1 tablet (30 mg total) by mouth every 4 (four) hours as needed. For heart rate greater than 120 or in Atrial fib  . dorzolamide (TRUSOPT) 2 % ophthalmic solution 1 drop 3 (three) times daily.  Marland Kitchen eplerenone (INSPRA) 25 MG tablet Take 1 tablet (25 mg total) by mouth daily.  . hydrALAZINE (APRESOLINE) 25 MG tablet Take 0.5 tablets (12.5 mg total) by mouth 2 (two) times daily.  . hydrochlorothiazide (HYDRODIURIL) 25 MG tablet Take 1 tablet (25 mg total) by mouth daily.  Marland Kitchen telmisartan (MICARDIS) 40 MG tablet Take 1 tablet (40 mg total) by mouth daily at 10 pm.     Allergies:   Amoxicillin   Social History   Socioeconomic History  . Marital status: Married    Spouse name: Not on file  . Number of children: Not on file  . Years of education: Not on file  . Highest education level: Not on file  Occupational History  . Not on file  Tobacco Use  . Smoking status: Never Smoker  . Smokeless tobacco: Never Used  Vaping Use  . Vaping Use: Never used  Substance and Sexual Activity  . Alcohol use: Yes    Comment: occ  . Drug use: Never  . Sexual activity: Yes  Other Topics Concern  . Not on file  Social History Narrative  . Not on file   Social Determinants of Health   Financial Resource Strain:   . Difficulty of Paying Living Expenses: Not on file  Food Insecurity:   . Worried About Charity fundraiser in the Last Year: Not on file  . Ran Out of Food in the Last Year: Not on file  Transportation Needs:   . Lack of Transportation (Medical): Not on file  . Lack of Transportation (Non-Medical): Not on file  Physical Activity:   . Days of Exercise per Week: Not on file  . Minutes of Exercise per Session: Not on  file  Stress:   . Feeling of Stress : Not on file  Social Connections:   . Frequency of Communication with Friends and Family: Not on file  . Frequency of Social Gatherings with Friends and Family: Not on file  . Attends Religious Services: Not on file  . Active Member of Clubs or Organizations: Not on file  . Attends Archivist Meetings: Not on file  . Marital Status: Not on file     Family History: The patient's family history includes Asthma in his father; Glaucoma in his father; Heart attack in his father. ROS:   Please see the history of present illness.    All other systems reviewed and are negative.  EKGs/Labs/Other Studies Reviewed:    The following studies were reviewed today:    Recent Labs:  Handcarried the patient's physician's office 03/11/2020: CMP normal potassium 3.7 creatinine 1.02 GFR greater than 59 cc normal liver function test TSH normal cholesterol 90 LDL  42 triglyceride 67 HDL 35  09/15/2019: ALT 15; B Natriuretic Peptide 168.6; Hemoglobin 18.0; Magnesium 2.2; Platelets 268; TSH 1.816 01/20/2020: BUN 17; Creatinine, Ser 0.89; NT-Pro BNP 188; Potassium 4.2; Sodium 139  Recent Lipid Panel    Component Value Date/Time   CHOL 217 (H) 09/16/2019 0431   TRIG 132 09/16/2019 0431   HDL 42 09/16/2019 0431   CHOLHDL 5.2 09/16/2019 0431   VLDL 26 09/16/2019 0431   LDLCALC 149 (H) 09/16/2019 0431    Physical Exam:    VS:  BP (!) 176/97   Pulse 72   Ht 5\' 9"  (1.753 m)   Wt 176 lb 1.3 oz (79.9 kg)   SpO2 99%   BMI 26.00 kg/m     Wt Readings from Last 3 Encounters:  04/25/20 176 lb 1.3 oz (79.9 kg)  01/06/20 184 lb (83.5 kg)  10/19/19 193 lb (87.5 kg)    Repeat blood pressure 134/84 GEN:  Well nourished, well developed in no acute distress HEENT: Normal NECK: No JVD; No carotid bruits LYMPHATICS: No lymphadenopathy CARDIAC: RRR, no murmurs, rubs, gallops RESPIRATORY:  Clear to auscultation without rales, wheezing or rhonchi  ABDOMEN: Soft,  non-tender, non-distended MUSCULOSKELETAL:  No edema; No deformity  SKIN: Warm and dry NEUROLOGIC:  Alert and oriented x 3 PSYCHIATRIC:  Normal affect    Signed, Shirlee More, MD  04/25/2020 2:52 PM    [facility]

## 2020-04-25 ENCOUNTER — Other Ambulatory Visit: Payer: Self-pay

## 2020-04-25 ENCOUNTER — Ambulatory Visit: Payer: Medicare HMO | Admitting: Cardiology

## 2020-04-25 ENCOUNTER — Encounter: Payer: Self-pay | Admitting: Cardiology

## 2020-04-25 VITALS — BP 176/97 | HR 72 | Ht 69.0 in | Wt 176.1 lb

## 2020-04-25 DIAGNOSIS — Z7901 Long term (current) use of anticoagulants: Secondary | ICD-10-CM

## 2020-04-25 DIAGNOSIS — R600 Localized edema: Secondary | ICD-10-CM

## 2020-04-25 DIAGNOSIS — I129 Hypertensive chronic kidney disease with stage 1 through stage 4 chronic kidney disease, or unspecified chronic kidney disease: Secondary | ICD-10-CM | POA: Diagnosis not present

## 2020-04-25 DIAGNOSIS — I480 Paroxysmal atrial fibrillation: Secondary | ICD-10-CM | POA: Diagnosis not present

## 2020-04-25 DIAGNOSIS — R001 Bradycardia, unspecified: Secondary | ICD-10-CM

## 2020-04-25 DIAGNOSIS — E876 Hypokalemia: Secondary | ICD-10-CM

## 2020-04-25 DIAGNOSIS — E782 Mixed hyperlipidemia: Secondary | ICD-10-CM

## 2020-04-25 NOTE — Patient Instructions (Signed)

## 2020-06-06 ENCOUNTER — Other Ambulatory Visit: Payer: Self-pay | Admitting: Medical

## 2020-06-07 ENCOUNTER — Telehealth: Payer: Self-pay

## 2020-06-07 NOTE — Telephone Encounter (Signed)
Patient with diagnosis of afib on Eliquis for anticoagulation.    Procedure: Colonoscopy  Date of procedure: 06/22/20    CHA2DS2-VASc Score = 4  This indicates a 4.8% annual risk of stroke. The patient's score is based upon: CHF History: 0 HTN History: 1 Diabetes History: 1 Stroke History: 0 Vascular Disease History: 0 Age Score: 2 Gender Score: 0      CrCl 71 ml/min  Per office protocol, patient can hold Eliquis for 1-2 days prior to procedure.

## 2020-06-07 NOTE — Telephone Encounter (Signed)
   [facility] Pre-operative Risk Assessment    HEARTCARE STAFF: - Please ensure there is not already an duplicate clearance open for this procedure. - Under Visit Info/Reason for Call, type in Other and utilize the format Clearance MM/DD/YY or Clearance TBD. Do not use dashes or single digits. - If request is for dental extraction, please clarify the # of teeth to be extracted.  Request for surgical clearance:  1. What type of surgery is being performed? Colonoscopy   2. When is this surgery scheduled? 06-22-2020  3. What type of clearance is required (medical clearance vs. Pharmacy clearance to hold med vs. Both)? Pharmacy  4. Are there any medications that need to be held prior to surgery and how long? Eliquis   5. Practice name and name of physician performing surgery? [facility]   6. What is the office phone number? [phone]   7.   What is the office fax number? [phone]  8.   Anesthesia type (None, local, MAC, general) ? None specified    Toni Arthurs 06/07/2020, 1:19 PM  _________________________________________________________________   (provider comments below)

## 2020-06-07 NOTE — Telephone Encounter (Signed)
Pharmacy, please comment of eliquis. Thanks!

## 2020-06-07 NOTE — Telephone Encounter (Signed)
   Primary Cardiologist: Shirlee More, MD  Chart reviewed as part of pre-operative protocol coverage. Patient is scheduled for a colonoscopy on 06/22/2020 and we were asked to give our recommendations on holding Eliquis.   Per Pharmacy and office protocol, patient can hold Eliquis for 1-2 days prior to procedure. Please restart this as soon as able following procedure.   I will route this recommendation to the requesting party via Epic fax function and remove from pre-op pool.  Please call with questions.  Darreld Mclean, PA-C 06/07/2020, 2:19 PM

## 2020-09-08 MED ORDER — DILTIAZEM HCL 30 MG PO TABS: 30.0000 mg | ORAL_TABLET | ORAL | 0 refills | Status: DC | PRN

## 2020-09-12 ENCOUNTER — Other Ambulatory Visit: Payer: Self-pay | Admitting: Medical

## 2020-10-11 ENCOUNTER — Other Ambulatory Visit: Payer: Self-pay | Admitting: Cardiology

## 2020-10-11 NOTE — Telephone Encounter (Signed)
Refill sent to pharmacy.   

## 2021-01-06 ENCOUNTER — Other Ambulatory Visit: Payer: Self-pay | Admitting: Cardiology

## 2021-01-06 NOTE — Telephone Encounter (Signed)
Refill sent to pharmacy.   

## 2021-02-02 IMAGING — CR DG HIP (WITH OR WITHOUT PELVIS) 2-3V*L*
2 series · 2 of 2 positions shown · non-contrast
Comparison: None

CLINICAL DATA: 74-year-old male with fall and trauma to the left
hip.

EXAM:
DG HIP (WITH OR WITHOUT PELVIS) 2-3V LEFT

[t hip ap left]
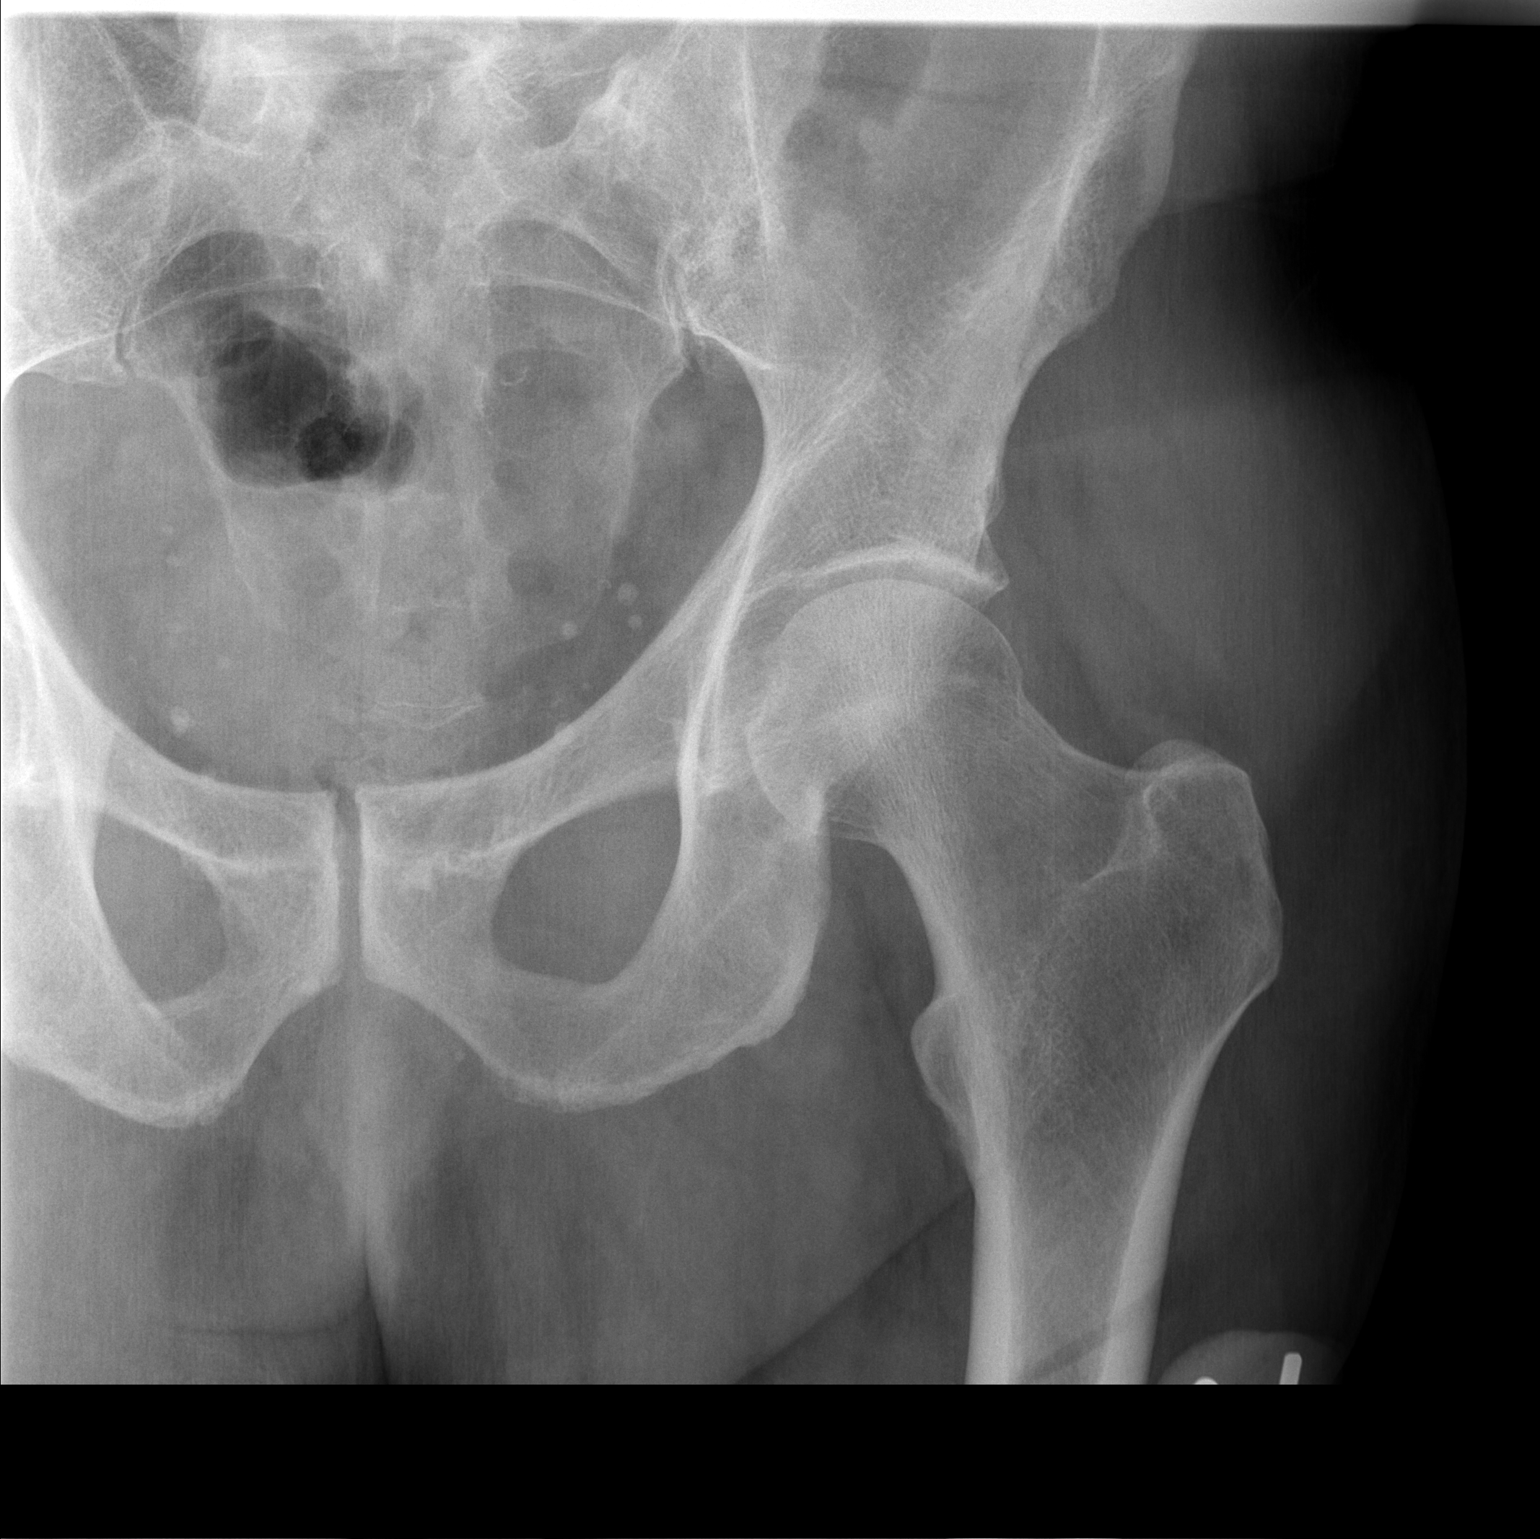

[t hip frog leg left]
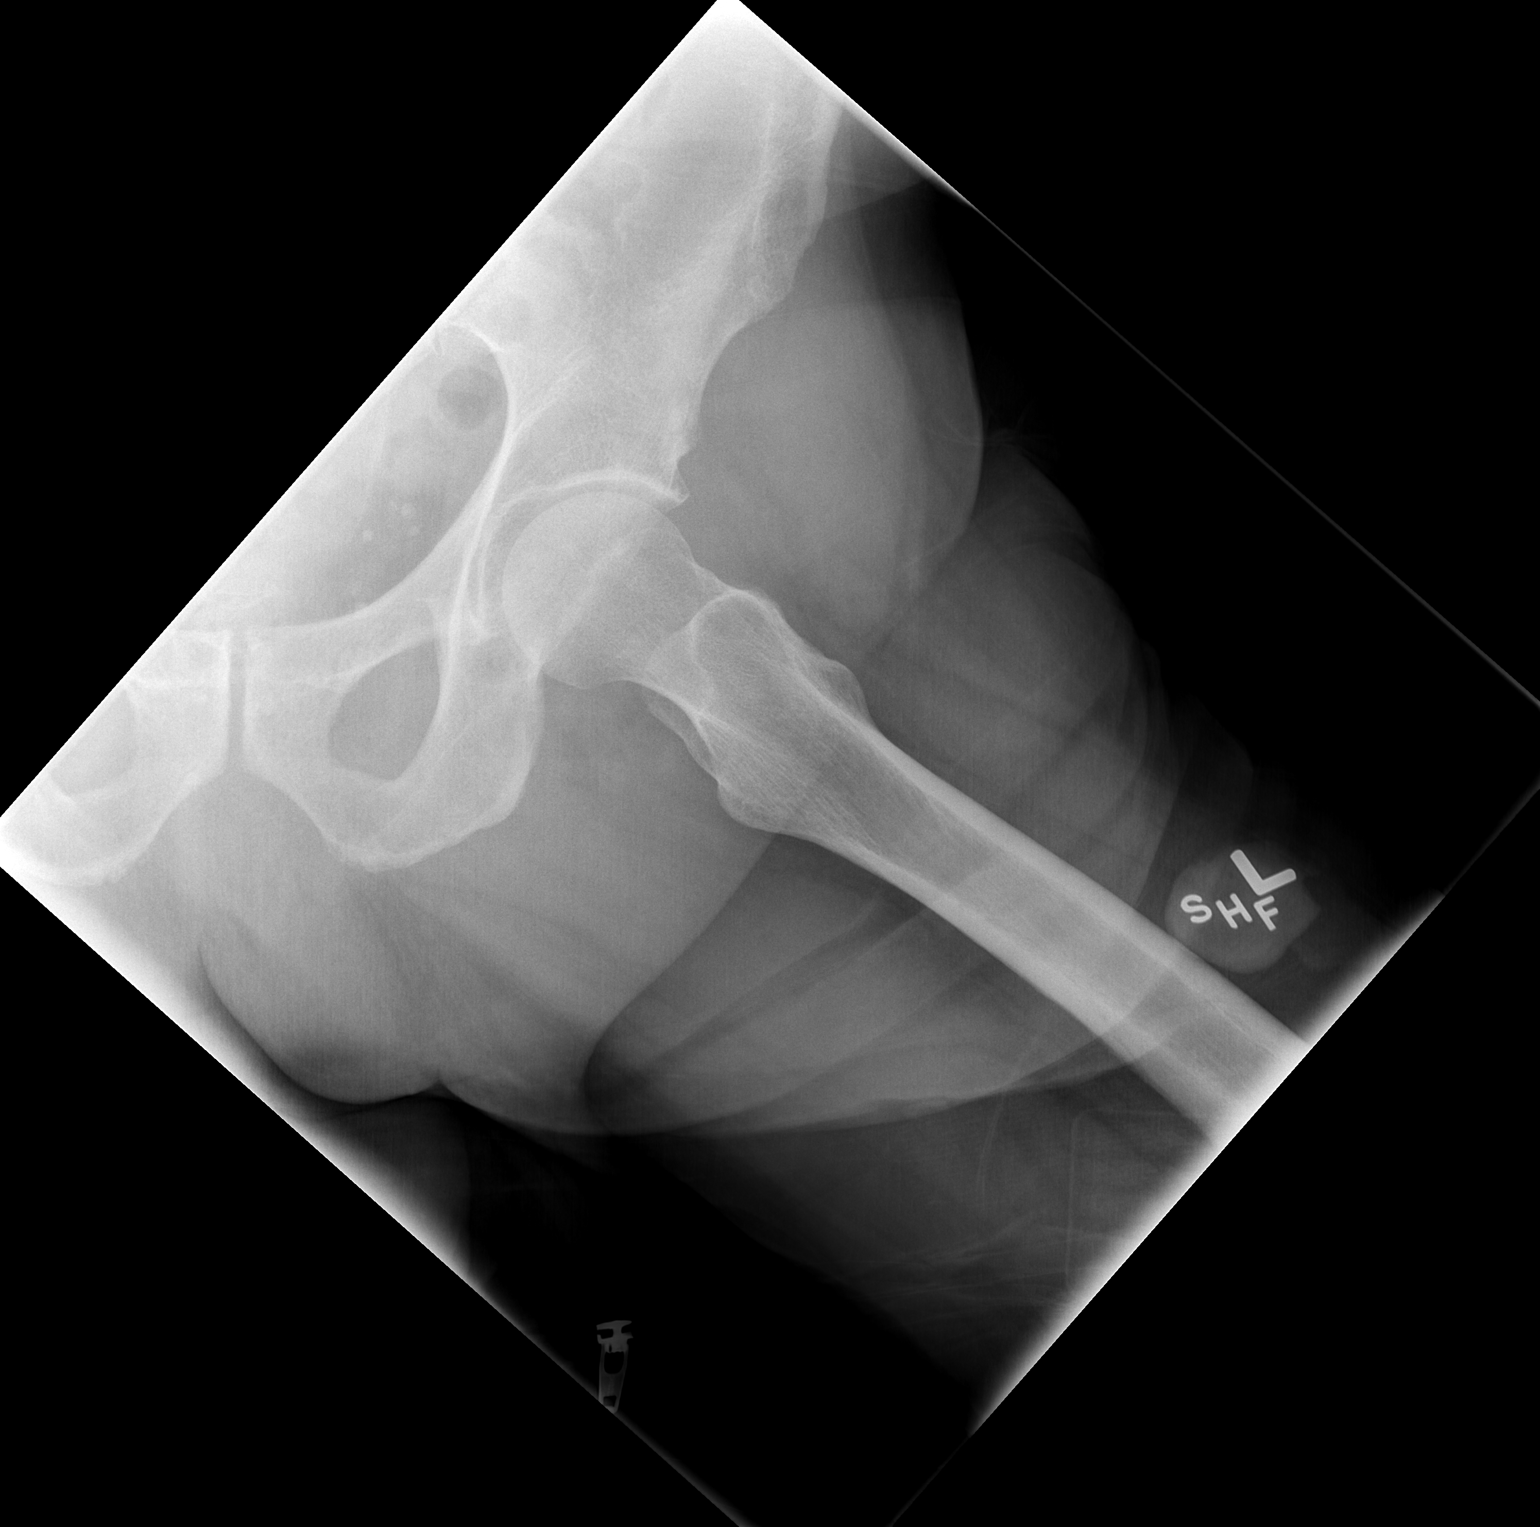

[2 of 2 positions shown; findings below may reference images not displayed]

FINDINGS: No acute fracture or dislocation. Mild osteopenia and mild arthritic
changes. The soft tissues are unremarkable.
IMPRESSION: Negative.

## 2021-02-02 IMAGING — CR DG FOOT COMPLETE 3+V*R*
3 series · 3 of 3 positions shown · non-contrast
Comparison: None.

CLINICAL DATA: Status post trauma.

EXAM:
RIGHT FOOT COMPLETE - 3+ VIEW

[t foot ap right]
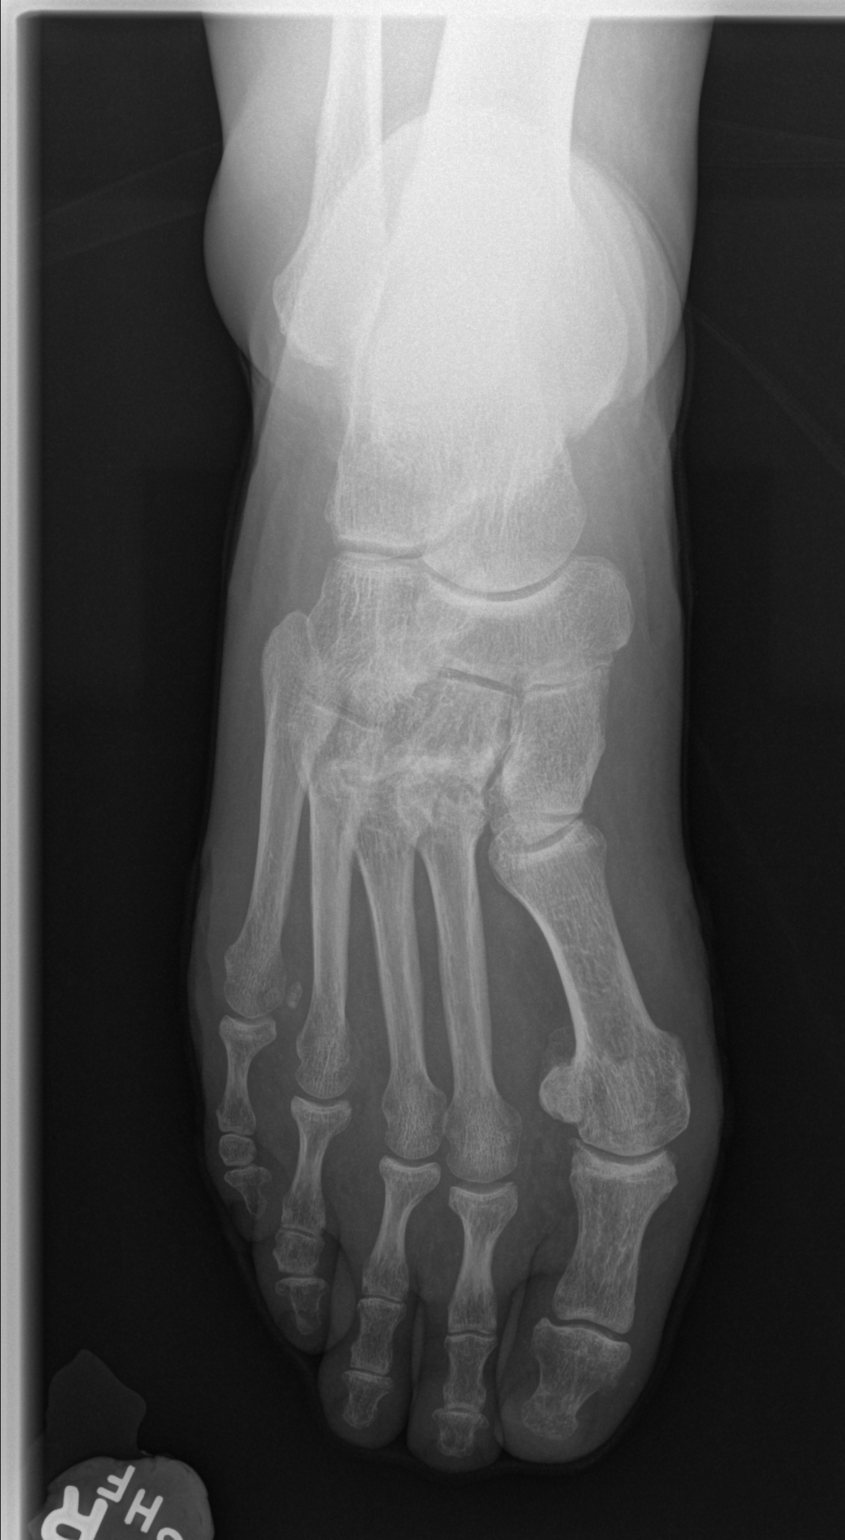

[t foot oblique right]
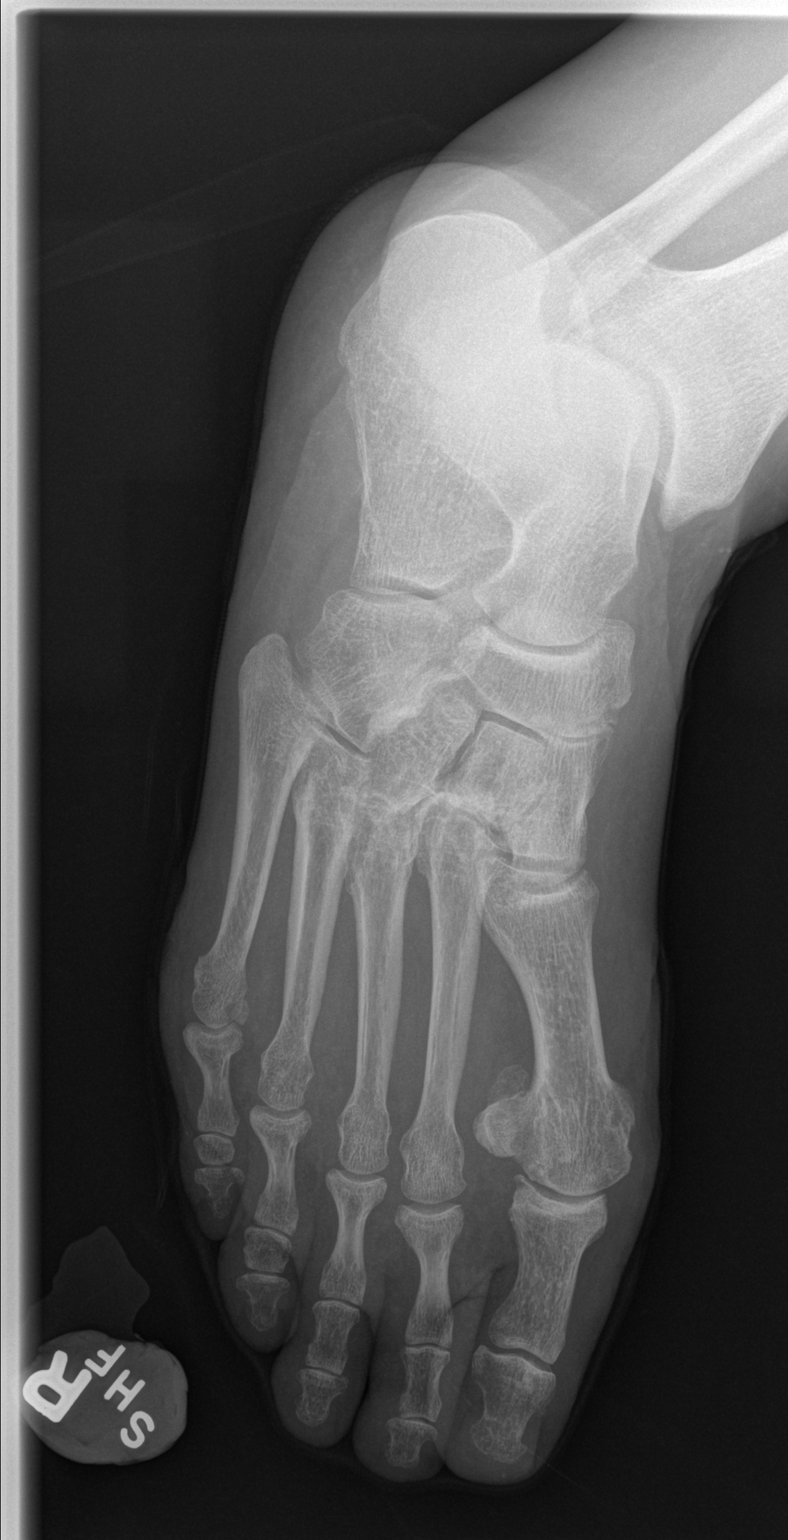

[t foot lat right]
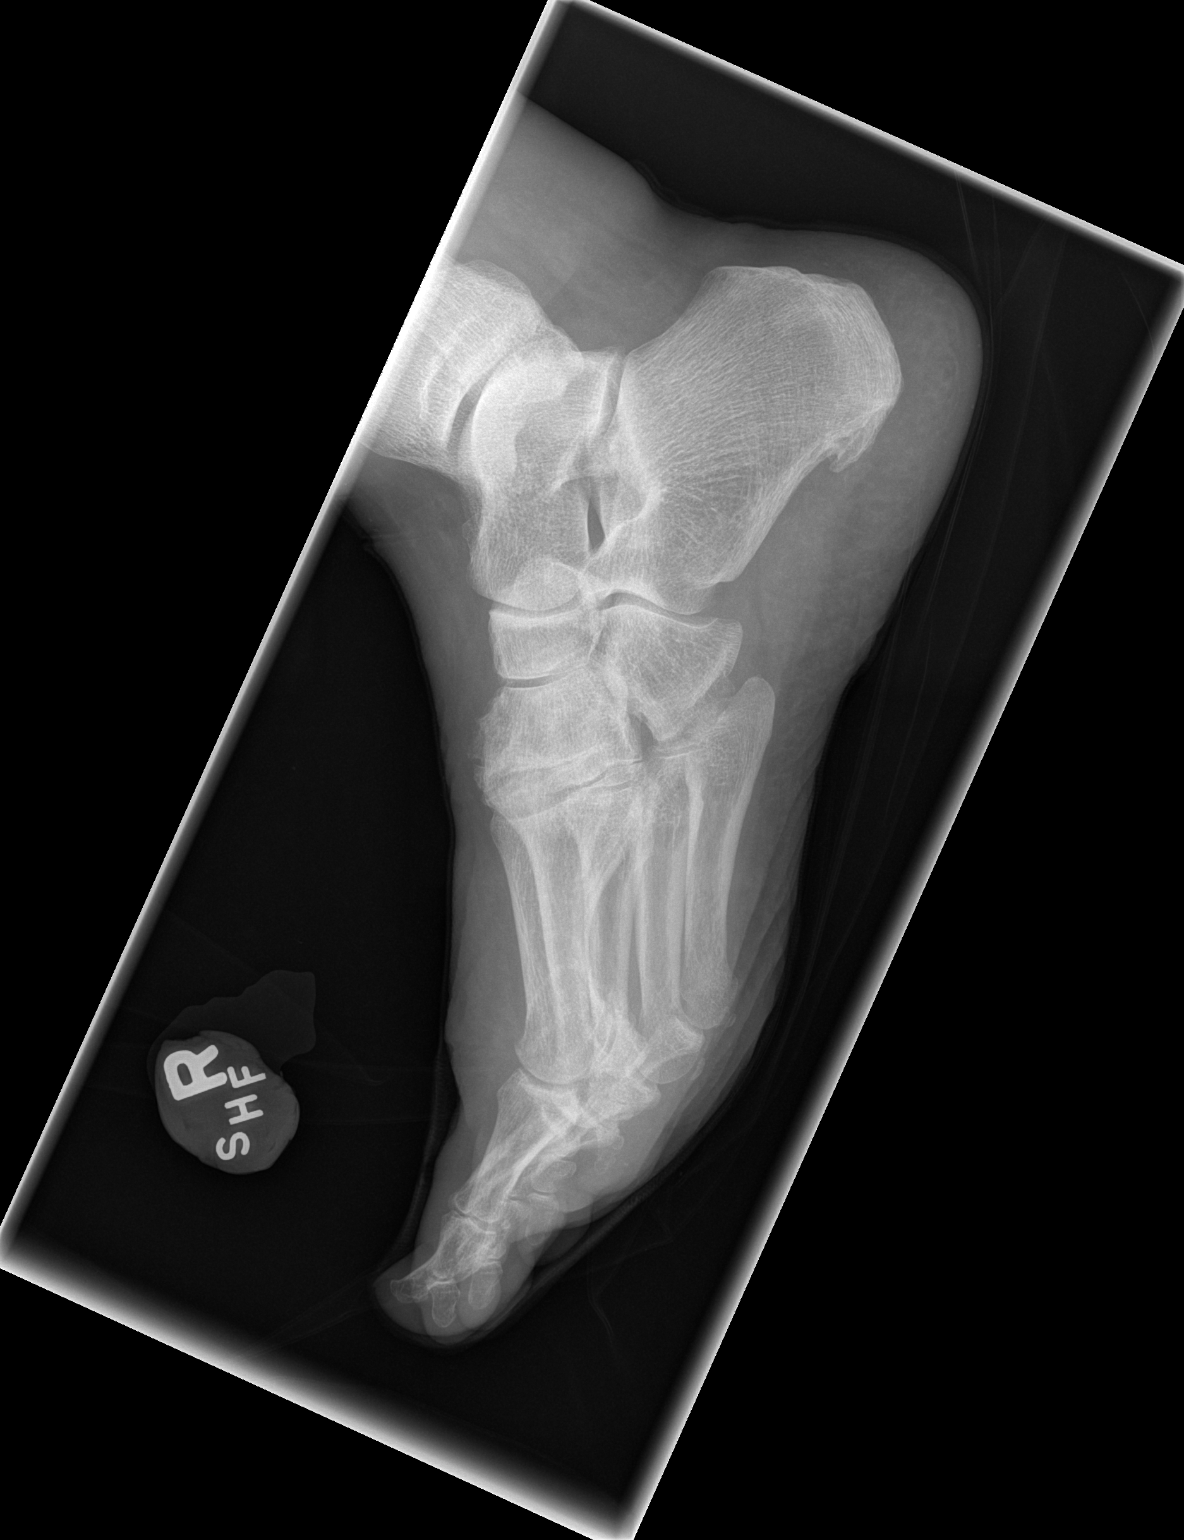

[3 of 3 positions shown; findings below may reference images not displayed]

FINDINGS: There is no evidence of an acute fracture or dislocation. Moderate
severity degenerative changes are seen along the dorsal aspect of
the mid right foot. This predominantly involves the second, third
and fourth tarsometatarsal articulations. A small plantar calcaneal
spur is noted. Mild dorsal soft tissue swelling is seen along the
distal aspect of the right foot.
IMPRESSION: Degenerative changes, as described above, without evidence of acute
fracture.

## 2021-02-10 ENCOUNTER — Telehealth: Payer: Self-pay

## 2021-02-10 NOTE — Telephone Encounter (Signed)
Patient's wife reports significant swelling, pain, and rash in both legs. Right leg is worse. Requesting call to address issue.

## 2021-02-10 NOTE — Telephone Encounter (Signed)
Per Dr. Harriet Masson advise to follow nurse recommendation mention in previous message. Patient will call back on Tuesday to report his condition. I advised the patient in the events of his condition worsening such as BP or HR elevated, weight gain or additional sx's to seek medical attention at the ER. Patient verbalized understanding and had no further questions.

## 2021-02-10 NOTE — Telephone Encounter (Signed)
Called pt who states that his legs have been swelling for over 1 month and have gotten worse in the past few weeks. Pt just got home from vacation and has not been checking his weight or bp/hr. Advised to keep legs elevated, wear compression stockings, track weight daily along with BP/HR and make sure he limits his sodium intake. How do you advise?

## 2021-02-14 NOTE — Telephone Encounter (Signed)
Spoke to the patient just now and he let me know that he has been taking his furosemide twice weekly on Tuesday and Friday. I advised him of Dr. Joya Gaskins recommendations. He verbalizes understanding.    Encouraged patient to call back with any questions or concerns.

## 2021-02-15 ENCOUNTER — Other Ambulatory Visit: Payer: Self-pay | Admitting: Cardiology

## 2021-02-15 DIAGNOSIS — I4891 Unspecified atrial fibrillation: Secondary | ICD-10-CM

## 2021-02-16 ENCOUNTER — Other Ambulatory Visit: Payer: Self-pay

## 2021-02-16 MED ORDER — FUROSEMIDE 20 MG PO TABS: 20.0000 mg | ORAL_TABLET | Freq: Every day | ORAL | 0 refills | Status: DC

## 2021-02-16 NOTE — Telephone Encounter (Signed)
52m, 79.9kg, scr 0.898 01/20/20, lovw/munley 04/25/20.

## 2021-03-12 ENCOUNTER — Other Ambulatory Visit: Payer: Self-pay | Admitting: Cardiology

## 2021-03-13 NOTE — Telephone Encounter (Signed)
Rx sent 

## 2021-04-14 ENCOUNTER — Ambulatory Visit: Payer: Medicare HMO | Admitting: Cardiology

## 2021-05-07 ENCOUNTER — Other Ambulatory Visit: Payer: Self-pay | Admitting: Cardiology

## 2021-05-08 ENCOUNTER — Ambulatory Visit: Payer: Medicare HMO | Admitting: Cardiology

## 2021-05-25 ENCOUNTER — Encounter (HOSPITAL_BASED_OUTPATIENT_CLINIC_OR_DEPARTMENT_OTHER): Payer: Self-pay | Admitting: Emergency Medicine

## 2021-05-25 ENCOUNTER — Emergency Department (HOSPITAL_BASED_OUTPATIENT_CLINIC_OR_DEPARTMENT_OTHER): Payer: Medicare HMO

## 2021-05-25 ENCOUNTER — Other Ambulatory Visit: Payer: Self-pay

## 2021-05-25 ENCOUNTER — Emergency Department (HOSPITAL_BASED_OUTPATIENT_CLINIC_OR_DEPARTMENT_OTHER)
Admission: EM | Admit: 2021-05-25 | Discharge: 2021-05-25 | Disposition: A | Discharge status: Home / Self Care | Payer: Medicare HMO | Attending: Emergency Medicine | Admitting: Emergency Medicine

## 2021-05-25 ENCOUNTER — Telehealth: Payer: Self-pay | Admitting: Cardiology

## 2021-05-25 DIAGNOSIS — Z7901 Long term (current) use of anticoagulants: Secondary | ICD-10-CM | POA: Insufficient documentation

## 2021-05-25 DIAGNOSIS — R002 Palpitations: Secondary | ICD-10-CM | POA: Diagnosis present

## 2021-05-25 DIAGNOSIS — R600 Localized edema: Secondary | ICD-10-CM | POA: Insufficient documentation

## 2021-05-25 DIAGNOSIS — I4891 Unspecified atrial fibrillation: Secondary | ICD-10-CM

## 2021-05-25 DIAGNOSIS — Z79899 Other long term (current) drug therapy: Secondary | ICD-10-CM | POA: Insufficient documentation

## 2021-05-25 DIAGNOSIS — E119 Type 2 diabetes mellitus without complications: Secondary | ICD-10-CM | POA: Diagnosis not present

## 2021-05-25 DIAGNOSIS — I10 Essential (primary) hypertension: Secondary | ICD-10-CM | POA: Diagnosis not present

## 2021-05-25 DIAGNOSIS — E876 Hypokalemia: Secondary | ICD-10-CM | POA: Diagnosis not present

## 2021-05-25 LAB — BASIC METABOLIC PANEL
Anion gap: 9 (ref 5–15)
BUN: 27 mg/dL — ABNORMAL HIGH (ref 8–23)
CO2: 25 mmol/L (ref 22–32)
Calcium: 9.1 mg/dL (ref 8.9–10.3)
Chloride: 104 mmol/L (ref 98–111)
Creatinine, Ser: 1.04 mg/dL (ref 0.61–1.24)
GFR, Estimated: >60 mL/min (ref >60)
Glucose, Bld: 118 mg/dL — ABNORMAL HIGH (ref 70–99)
Potassium: 3 mmol/L — ABNORMAL LOW (ref 3.5–5.1)
Sodium: 138 mmol/L (ref 135–145)

## 2021-05-25 LAB — CBC WITH DIFFERENTIAL/PLATELET
Abs Immature Granulocytes: 0.02 10*3/uL (ref 0.00–0.07)
Basophils Absolute: 0.1 10*3/uL (ref 0.0–0.1)
Basophils Relative: 1 %
Eosinophils Absolute: 0.5 10*3/uL (ref 0.0–0.5)
Eosinophils Relative: 6 %
HCT: 43.9 % (ref 39.0–52.0)
Hemoglobin: 15.1 g/dL (ref 13.0–17.0)
Immature Granulocytes: 0 %
Lymphocytes Relative: 45 %
Lymphs Abs: 4.1 10*3/uL — ABNORMAL HIGH (ref 0.7–4.0)
MCH: 31.1 pg (ref 26.0–34.0)
MCHC: 34.4 g/dL (ref 30.0–36.0)
MCV: 90.3 fL (ref 80.0–100.0)
Monocytes Absolute: 0.8 10*3/uL (ref 0.1–1.0)
Monocytes Relative: 9 %
Neutro Abs: 3.5 10*3/uL (ref 1.7–7.7)
Neutrophils Relative %: 39 %
Platelets: 225 10*3/uL (ref 150–400)
RBC: 4.86 MIL/uL (ref 4.22–5.81)
RDW: 13 % (ref 11.5–15.5)
WBC: 8.9 10*3/uL (ref 4.0–10.5)
nRBC: 0 % (ref 0.0–0.2)

## 2021-05-25 LAB — TROPONIN I (HIGH SENSITIVITY)
Troponin I (High Sensitivity): 6 ng/L (ref <18)
Troponin I (High Sensitivity): 12 ng/L (ref <18)

## 2021-05-25 LAB — MAGNESIUM: Magnesium: 2.2 mg/dL (ref 1.7–2.4)

## 2021-05-25 MED ORDER — POTASSIUM CHLORIDE CRYS ER 20 MEQ PO TBCR: 20.0000 meq | EXTENDED_RELEASE_TABLET | Freq: Every day | ORAL | 0 refills | Status: DC

## 2021-05-25 MED ORDER — DILTIAZEM LOAD VIA INFUSION: 15.0000 mg | Freq: Once | INTRAVENOUS | Status: DC

## 2021-05-25 MED ORDER — POTASSIUM CHLORIDE CRYS ER 20 MEQ PO TBCR
40.0000 meq | EXTENDED_RELEASE_TABLET | Freq: Once | ORAL | Status: AC
  Administered 2021-05-25: 40 meq via ORAL
  Filled 2021-05-25: qty 2

## 2021-05-25 MED ORDER — DILTIAZEM HCL-DEXTROSE 125-5 MG/125ML-% IV SOLN (PREMIX)
5.0000 mg/h | INTRAVENOUS | Status: DC
  Administered 2021-05-25: 5 mg/h via INTRAVENOUS
  Filled 2021-05-25: qty 125

## 2021-05-25 MED ORDER — DILTIAZEM HCL 25 MG/5ML IV SOLN
15.0000 mg | Freq: Once | INTRAVENOUS | Status: AC
  Administered 2021-05-25: 15 mg via INTRAVENOUS
  Filled 2021-05-25: qty 5

## 2021-05-25 MED ORDER — POTASSIUM CHLORIDE 10 MEQ/100ML IV SOLN
10.0000 meq | INTRAVENOUS | Status: AC
  Administered 2021-05-25 (×2): 10 meq via INTRAVENOUS
  Filled 2021-05-25 (×2): qty 100

## 2021-05-25 MED ORDER — DILTIAZEM HCL-DEXTROSE 125-5 MG/125ML-% IV SOLN (PREMIX): 5.0000 mg/h | INTRAVENOUS | Status: DC

## 2021-05-25 NOTE — ED Notes (Signed)
Pt placed back on cardiac monitor after ambulating  Monitor reading sinus rhythm rate in the 60s  EDP, Ralene Bathe notified

## 2021-05-25 NOTE — Discharge Instructions (Addendum)
Do not take your HCTZ today.  You may re-start it tomorrow.  Continue your medications as prescribed.

## 2021-05-25 NOTE — Telephone Encounter (Signed)
Pt was seen in ED yesterday for afib.Marland Kitchen pt per ED advice is calling to see if he should come in sooner than scheduled appt on 10/25... please advise.

## 2021-05-25 NOTE — ED Notes (Signed)
Cardizem drip placed on hold  Pt ambulated around nursing dept without any signs or symptoms of chest discomfort, dizziness, or heart palpitations  Steady gait  No distress  Tolerated well

## 2021-05-25 NOTE — ED Triage Notes (Signed)
Pt states he woke up with elevated heart rate. Pt has hx of afib.

## 2021-05-25 NOTE — ED Provider Notes (Signed)
[facility] EMERGENCY DEPARTMENT Provider Note   CSN: [id number] Arrival date & time: 05/25/21  0230     History Chief Complaint  Patient presents with   Tachycardia    Jeffrey Contreras is a 75 y.o. male.  The history is provided by the patient and medical records.  Jeffrey Contreras is a 75 y.o. male who presents to the Emergency Department complaining of palpitations. He presents the emergency department accompanied by his wife for evaluation of palpitations. He was feeling well when he went to bed last night and when he awoke at two in the morning he had sensation that he needed to clear his throat. After he cleared his throat he felt palpitations and rapid heart rate. When he checked his heart rate at home it was 158. He then began to develop central chest discomfort and he presented for further evaluation. Overall he describes the chest discomfort is mild. No difficulty breathing. He has a history of paroxysmal a fib and had a similar episode about a year ago. He takes Eliquis BID. He does have PRN diltiazem available, but did not take it today due to the chest discomfort.    Past Medical History:  Diagnosis Date   Atrial fibrillation ([place]) 09/2019   Atrial fibrillation with RVR (HCC) 09/16/2019   Benign paroxysmal positional vertigo of left ear 01/21/2018   Hallpike positive left ear down for rotational nystagmus.  Formatting of this note might be different from the original. Hallpike positive left ear down for rotational nystagmus.   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 Continues with fullness in the ears with intermittent popping.  Causes intermittent unsteadiness.  Presents to discuss ventilation tube placement.  EXAMINATION reveals normal external canals and tympanic membranes. PLAN: I am not sure if this is more an inner ear or middle ear problem.  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/2019   Benign paroxysmal positional vertigo of left ear 01/21/2018   Dysfunction of left eustachian tube 02/11/2017   Primary open angle glaucoma 10/13/2013    Past Surgical History:  Procedure Laterality Date   GLAUCOMA SURGERY     MANDIBLE FRACTURE SURGERY     TONSILLECTOMY         Family History  Problem Relation Age of Onset   Asthma Father    Heart attack Father    Glaucoma Father     Social History   Tobacco Use   Smoking status: Never   Smokeless tobacco: Never  Vaping Use   Vaping Use: Never used  Substance Use Topics   Alcohol use: Yes    Comment: occ   Drug use: Never    Home Medications Prior to Admission medications   Medication Sig Start Date End Date Taking? Authorizing Provider  potassium chloride SA (KLOR-CON) 20 MEQ tablet Take 1 tablet (20 mEq total) by mouth daily. 05/25/21  Yes Quintella Reichert, MD  apixaban (ELIQUIS) 5 MG TABS tablet Take 1 tablet by mouth twice daily. Labs needed for further refills 02/16/21   Richardo Priest, MD  atorvastatin (LIPITOR) 40 MG tablet TAKE 1 TABLET BY MOUTH ONCE DAILY AT  6  PM 05/08/21   Richardo Priest, MD  brimonidine-timolol (COMBIGAN) 0.2-0.5 % ophthalmic solution Place 1 drop into both eyes 2 (two) times daily. 11/11/19   [provider]   diltiazem (CARDIZEM) 30 MG tablet Take 1 tablet (30 mg total) by mouth every 4 (four) hours as needed. For heart rate greater than 120 or in Atrial fib 09/08/20   Richardo Priest, MD  dorzolamide (TRUSOPT) 2 % ophthalmic solution 1 drop 3 (three) times daily.    [provider]  eplerenone (INSPRA) 25 MG tablet Take 1 tablet by mouth once daily 01/06/21   Richardo Priest, MD  furosemide (LASIX) 20 MG tablet Take 1 tablet (20 mg total) by mouth daily. As needed for edema. 02/16/21 05/17/21  Richardo Priest, MD  hydrALAZINE (APRESOLINE) 25 MG tablet Take 1/2 (one-half) tablet by mouth twice daily 02/16/21   Richardo Priest, MD  hydrochlorothiazide (HYDRODIURIL) 25 MG tablet Take 1 tablet by mouth once daily 03/13/21   Richardo Priest, MD  telmisartan (MICARDIS) 40 MG tablet TAKE 1 TABLET BY MOUTH ONCE DAILY AT  10  PM 02/16/21   Richardo Priest, MD    Allergies    Amoxicillin  Review of Systems   Review of Systems  All other systems reviewed and are negative.  Physical Exam Updated Vital Signs BP 104/77 (BP Location: Left Arm)   Pulse 75   Temp 97.8 F (36.6 C) (Oral)   Resp 13   Ht 5\' 9"  (1.753 m)   Wt 80.3 kg   SpO2 99%   BMI 26.14 kg/m   Physical Exam Vitals and nursing note reviewed.  Constitutional:      Appearance: He is well-developed.  HENT:     Head: Normocephalic and atraumatic.  Cardiovascular:     Rate and Rhythm: Tachycardia present. Rhythm irregular.     Heart sounds: No murmur heard. Pulmonary:     Effort: Pulmonary effort is normal. No respiratory distress.     Breath sounds: Normal breath sounds.  Abdominal:     Palpations: Abdomen is soft.     Tenderness: There is no abdominal tenderness. There is no guarding or rebound.  Musculoskeletal:        General: No tenderness.     Comments: Trace edema to BLE  Skin:    General: Skin is warm and dry.  Neurological:     Mental Status: He is alert and oriented to person, place, and time.  Psychiatric:         Behavior: Behavior normal.    ED Results / Procedures / Treatments   Labs (all labs ordered are listed, but only abnormal results are displayed) Labs Reviewed  BASIC METABOLIC PANEL - Abnormal; Notable for the following components:      Result Value   Potassium 3.0 (*)    Glucose, Bld 118 (*)    BUN 27 (*)    All other components within normal limits  CBC WITH DIFFERENTIAL/PLATELET - Abnormal; Notable for the following components:   Lymphs Abs 4.1 (*)    All other components within normal limits  MAGNESIUM  TROPONIN I (HIGH SENSITIVITY)  TROPONIN I (HIGH SENSITIVITY)    EKG EKG Interpretation  Date/Time:  Thursday May 25 2021 06:45:16 EDT Ventricular Rate:  61 PR Interval:  172 QRS Duration: 112 QT Interval:  432 QTC Calculation: 436 R Axis:   16 Text Interpretation: Sinus rhythm Borderline intraventricular conduction delay Abnormal R-wave progression, early transition Confirmed by Quintella Reichert [phone]) on 05/25/2021 6:48:24 AM  Radiology DG Chest Port 1 View  Result Date: 05/25/2021 CLINICAL DATA:  Atrial fibrillation, tachycardia EXAM: PORTABLE CHEST 1 VIEW COMPARISON:  09/15/2019 FINDINGS: Lungs are clear save for minimal left basilar scarring. No pneumothorax or pleural effusion. Cardiac size within normal limits. Pulmonary vascularity is normal. Osseous structures are age-appropriate. Degenerative changes seen within the left shoulder. No acute bone abnormality. IMPRESSION: No active disease. Electronically Signed   By: Fidela Salisbury M.D.   On: 05/25/2021 03:11    Procedures Procedures   Medications Ordered in ED Medications  diltiazem (CARDIZEM) 125 mg in dextrose 5% 125 mL (1 mg/mL) infusion (5 mg/hr Intravenous New Bag/Given 05/25/21 0412)  diltiazem (CARDIZEM) injection 15 mg (15 mg Intravenous Given 05/25/21 0250)  potassium chloride SA (KLOR-CON) CR tablet 40 mEq (40 mEq Oral Given 05/25/21 0331)  potassium chloride 10 mEq in 100 mL IVPB (0 mEq  Intravenous Stopped 05/25/21 0532)    ED Course  I have reviewed the triage vital signs and the nursing notes.  Pertinent labs & imaging results that were available during my care of the patient were reviewed by me and considered in my medical decision making (see chart for details).    MDM Rules/Calculators/A&P                          patient with history of paroxysmal atrial fibrillation here for evaluation of palpitations. Patient in a fib with RVR on ED arrival. He does have ST depression, which is new. He was treated with bolus of Cardizem with improvement in his rates in resolution of his ST changes. Troponins are negative. Patient has been pain free since improvement in his rate. He has been asymptomatic with controlled rate in the department. Labs are significant for hypokalemia. This was repeated with oral and IV potassium. While awaiting potassium replacement and repeat troponin patient was placed on a Cardizem drip without significant change in his rhythm. The drip was discontinued and patient ambulated the department. After return to the stretcher patient was asymptomatic. On repeat EKG he is in sinus rhythm. Due to hypokalemia recommend holding his HCTZ for today, may restart tomorrow. Will prescribe Cader for the next several days. Recommend follow-up with his cardiologist regarding episode of a fib. Return precautions discussed.   CHA2DS2/VAS Stroke Risk Points  Current as of 4 minutes ago     4 >= 2 Points: High Risk  1 - 1.99 Points: Medium Risk  0 Points: Low Risk    Last Change: N/A      Details    This score determines the patient's risk of having a stroke if the  patient has atrial fibrillation.       Points Metrics  0 Has Congestive Heart Failure:  No    Current as of 4 minutes ago  0 Has Vascular Disease:  No    Current as of 4 minutes ago  1 Has Hypertension:  Yes    Current as of 4 minutes ago  2 Age:  89    Current as of 4 minutes  ago  1 Has Diabetes:   Yes    Current as of 4 minutes ago  0 Had Stroke:  No  Had TIA:  No  Had Thromboembolism:  No    Current as of 4 minutes ago  0 Male:  No    Current as of 4 minutes ago           Final Clinical Impression(s) / ED Diagnoses Final diagnoses:  Atrial fibrillation with RVR ([place])  Hypokalemia    Rx / DC Orders ED Discharge Orders          Ordered    potassium chloride SA (KLOR-CON) 20 MEQ tablet  Daily        05/25/21 0649             Quintella Reichert, MD 05/25/21 [phone]

## 2021-05-25 NOTE — Telephone Encounter (Signed)
Spoke to the patient and his wife just now and let them know Dr. Joya Gaskins recommendations. I put in the referral for the patient to see Dr. Curt Bears with EP as they prefer to go to [facility] if possible.    Encouraged patient to call back with any questions or concerns.

## 2021-05-25 NOTE — ED Notes (Signed)
Correction to vital signs  Resp 18 not 31

## 2021-06-05 NOTE — Progress Notes (Signed)
Cardiology Office Note:    Date:  06/06/2021   ID:  Jeffrey Contreras, DOB Sep 30, 1945, MRN [id number]  PCP:  Geralynn Ochs, MD  Cardiologist:  Shirlee More, MD    Referring MD: Geralynn Ochs, MD    ASSESSMENT:    1. Paroxysmal atrial fibrillation (HCC)   2. Chronic anticoagulation   3. Sinus bradycardia   4. Benign hypertension with chronic kidney disease    PLAN:    In order of problems listed above:  Another episode of atrial fibrillation self-limited may well been precipitated with hypokalemia recheck his electrolytes including magnesium likely need supplementation of both ongoing and will discontinue his thiazide diuretic.  He will be seeing EP for consultation is a difficult case with his resting sinus bradycardia and has not to start him on antiarrhythmic drug we could consider a pill in a pocket approach with an agent like flecainide.  The other option would be EP catheter ablation.  He will remain anticoagulated.  He has diltiazem at home that he can take as needed Continue anticoagulation moderate stroke risk Stable on no rate suppressing medication Discontinue hydrochlorothiazide with hypokalemia and relatively low blood pressure   Next appointment: 6 months   Medication Adjustments/Labs and Tests Ordered: Current medicines are reviewed at length with the patient today.  Concerns regarding medicines are outlined above.  No orders of the defined types were placed in this encounter.  No orders of the defined types were placed in this encounter.   Follow-up for atrial fibrillation had an episode ED visit 05/25/2021   History of Present Illness:    Jeffrey Contreras is a 75 y.o. 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 Had another episode of atrial fibrillation seen in the ED 05/25/2021 and spontaneously corrected to sinus rhythm after about 4 hours. Who is taking loop proximal and distal diuretic potassium was 3.0 was given supplements 5 doses and has had no recheck. Is scheduled to have EP consultation later this month He is back to normal no palpitation shortness of breath edema chest pain or syncope and tolerates his anticoagulant without bleeding Home blood pressure runs from 299-2 20 systolic over 42-68 heart rates 48 to 56 bpm Compliance with diet, lifestyle and medications: Yes Past Medical History:  Diagnosis Date   Atrial fibrillation ([place]) 09/2019   Atrial fibrillation with RVR ([place]) 09/16/2019   Benign paroxysmal positional vertigo of left ear 01/21/2018   Hallpike positive left ear down for rotational nystagmus.  Formatting of this note might be different from the original. Hallpike positive left ear down for rotational nystagmus.   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 Continues with fullness in the ears with intermittent popping.  Causes intermittent unsteadiness.  Presents to discuss ventilation tube placement. EXAMINATION reveals normal external canals and tympanic membranes. PLAN: I am not sure if this is more an inner ear or middle ear problem.  We discussed diagnostic myringotomy to better   Essential hypertension 09/16/2019   Glaucoma    Hypertension    Hypokalemia 09/16/2019   Primary open  angle glaucoma 10/13/2013   Renal insufficiency 09/16/2019    Past Surgical History:  Procedure Laterality Date   GLAUCOMA SURGERY     MANDIBLE FRACTURE SURGERY     TONSILLECTOMY      Current Medications: No outpatient  medications have been marked as taking for the 06/06/21 encounter (Appointment) with Richardo Priest, MD.     Allergies:   Amoxicillin and Hydrocodone-acetaminophen   Social History   Socioeconomic History   Marital status: Married    Spouse name: Not on file   Number of children: Not on file   Years of education: Not on file   Highest education level: Not on file  Occupational History   Not on file  Tobacco Use   Smoking status: Never   Smokeless tobacco: Never  Vaping Use   Vaping Use: Never used  Substance and Sexual Activity   Alcohol use: Yes    Comment: occ   Drug use: Never   Sexual activity: Yes  Other Topics Concern   Not on file  Social History Narrative   Not on file   Social Determinants of Health   Financial Resource Strain: Not on file  Food Insecurity: Not on file  Transportation Needs: Not on file  Physical Activity: Not on file  Stress: Not on file  Social Connections: Not on file     Family History: The patient's family history includes Asthma in his father; Glaucoma in his father; Heart attack in his father. ROS:   Please see the history of present illness.    All other systems reviewed and are negative.  EKGs/Labs/Other Studies Reviewed:    The following studies were reviewed today:  EKG:  EKG ordered today and personally reviewed.  The ekg ordered today demonstrates sinus bradycardia 49 bpm otherwise normal EKG  Recent Labs: 05/25/2021: BUN 27; Creatinine, Ser 1.04; Hemoglobin 15.1; Magnesium 2.2; Platelets 225; Potassium 3.0; Sodium 138  Recent Lipid Panel    Component Value Date/Time   CHOL 217 (H) 09/16/2019 0431   TRIG 132 09/16/2019 0431   HDL 42 09/16/2019 0431   CHOLHDL 5.2 09/16/2019 0431   VLDL 26 09/16/2019 0431   LDLCALC 149 (H) 09/16/2019 0431    Physical Exam:    VS:  There were no vitals taken for this visit.    Wt Readings from Last 3 Encounters:  05/25/21 177 lb (80.3 kg)  04/25/20 176 lb 1.3 oz (79.9 kg)   01/06/20 184 lb (83.5 kg)     GEN:  Well nourished, well developed in no acute distress HEENT: Normal NECK: No JVD; No carotid bruits LYMPHATICS: No lymphadenopathy CARDIAC: RRR, no murmurs, rubs, gallops RESPIRATORY:  Clear to auscultation without rales, wheezing or rhonchi  ABDOMEN: Soft, non-tender, non-distended MUSCULOSKELETAL:  No edema; No deformity  SKIN: Warm and dry NEUROLOGIC:  Alert and oriented x 3 PSYCHIATRIC:  Normal affect    Signed, Shirlee More, MD  06/06/2021 1:06 PM    [facility]

## 2021-06-06 ENCOUNTER — Encounter: Payer: Self-pay | Admitting: Cardiology

## 2021-06-06 ENCOUNTER — Ambulatory Visit: Payer: Medicare HMO | Admitting: Cardiology

## 2021-06-06 ENCOUNTER — Other Ambulatory Visit: Payer: Self-pay

## 2021-06-06 VITALS — BP 124/72 | HR 49 | Ht 69.0 in | Wt 180.0 lb

## 2021-06-06 DIAGNOSIS — I480 Paroxysmal atrial fibrillation: Secondary | ICD-10-CM

## 2021-06-06 DIAGNOSIS — R001 Bradycardia, unspecified: Secondary | ICD-10-CM | POA: Diagnosis not present

## 2021-06-06 DIAGNOSIS — I129 Hypertensive chronic kidney disease with stage 1 through stage 4 chronic kidney disease, or unspecified chronic kidney disease: Secondary | ICD-10-CM | POA: Diagnosis not present

## 2021-06-06 DIAGNOSIS — Z7901 Long term (current) use of anticoagulants: Secondary | ICD-10-CM | POA: Diagnosis not present

## 2021-06-06 MED ORDER — ATORVASTATIN CALCIUM 40 MG PO TABS: ORAL_TABLET | ORAL | 3 refills | Status: DC

## 2021-06-06 MED ORDER — APIXABAN 5 MG PO TABS: ORAL_TABLET | ORAL | 0 refills | Status: DC

## 2021-06-06 MED ORDER — TELMISARTAN 40 MG PO TABS: ORAL_TABLET | ORAL | 3 refills | Status: DC

## 2021-06-06 MED ORDER — EPLERENONE 25 MG PO TABS: 25.0000 mg | ORAL_TABLET | Freq: Every day | ORAL | 3 refills | Status: DC

## 2021-06-06 MED ORDER — HYDRALAZINE HCL 25 MG PO TABS: ORAL_TABLET | ORAL | 3 refills | Status: DC

## 2021-06-06 NOTE — Patient Instructions (Addendum)
Medication Instructions:  Your physician has recommended you make the following change in your medication:  STOP: Hydrochlorothiazide.  *If you need a refill on your cardiac medications before your next appointment, please call your pharmacy*   Lab Work: Your physician recommends that you return for lab work in: [place], [place] If you have labs (blood work) drawn today and your tests are completely normal, you will receive your results only by: [place] (if you have MyChart) OR A paper copy in the mail If you have any lab test that is abnormal or we need to change your treatment, we will call you to review the results.   Testing/Procedures: None   Follow-Up: At [facility], you and your health needs are our priority.  As part of our continuing mission to provide you with exceptional heart care, we have created designated Provider Care Teams.  These Care Teams include your primary Cardiologist (physician) and Advanced Practice Providers (APPs -  Physician Assistants and Nurse Practitioners) who all work together to provide you with the care you need, when you need it.  We recommend signing up for the patient portal called "MyChart".  Sign up information is provided on this After Visit Summary.  MyChart is used to connect with patients for Virtual Visits (Telemedicine).  Patients are able to view lab/test results, encounter notes, upcoming appointments, etc.  Non-urgent messages can be sent to your provider as well.   To learn more about what you can do with MyChart, go to [email or web address].    Your next appointment:   6 month(s)  The format for your next appointment:   In Person  Provider:   Shirlee More, MD   Other Instructions

## 2021-06-07 ENCOUNTER — Telehealth: Payer: Self-pay | Admitting: Emergency Medicine

## 2021-06-07 DIAGNOSIS — Z79899 Other long term (current) drug therapy: Secondary | ICD-10-CM

## 2021-06-07 LAB — BASIC METABOLIC PANEL
BUN/Creatinine Ratio: 21 (ref 10–24)
BUN: 23 mg/dL (ref 8–27)
CO2: 23 mmol/L (ref 20–29)
Calcium: 9.5 mg/dL (ref 8.6–10.2)
Chloride: 104 mmol/L (ref 96–106)
Creatinine, Ser: 1.07 mg/dL (ref 0.76–1.27)
Glucose: 92 mg/dL (ref 70–99)
Potassium: 4.4 mmol/L (ref 3.5–5.2)
Sodium: 140 mmol/L (ref 134–144)
eGFR: 72 mL/min/{1.73_m2} (ref >59)

## 2021-06-07 LAB — MAGNESIUM: Magnesium: 2.2 mg/dL (ref 1.6–2.3)

## 2021-06-07 MED ORDER — POTASSIUM CHLORIDE CRYS ER 20 MEQ PO TBCR: 20.0000 meq | EXTENDED_RELEASE_TABLET | Freq: Two times a day (BID) | ORAL | 2 refills | Status: DC

## 2021-06-07 NOTE — Telephone Encounter (Signed)
-----   Message from Jeffrey Priest, MD sent at 06/07/2021  7:18 AM EDT ----- He had a recent ED visit atrial fibrillation with hypokalemia  Needs to restart potassium 20 mEq twice daily  1 week recheck BMP

## 2021-06-07 NOTE — Telephone Encounter (Signed)
Spoke to patient informed him of results. He will start potassium 20 meq twice daily and repeat labs in 1 week. No further questions.

## 2021-06-08 DIAGNOSIS — E7439 Other disorders of intestinal carbohydrate absorption: Secondary | ICD-10-CM | POA: Insufficient documentation

## 2021-06-08 DIAGNOSIS — I872 Venous insufficiency (chronic) (peripheral): Secondary | ICD-10-CM | POA: Insufficient documentation

## 2021-06-08 DIAGNOSIS — Z85820 Personal history of malignant melanoma of skin: Secondary | ICD-10-CM | POA: Insufficient documentation

## 2021-06-08 DIAGNOSIS — E785 Hyperlipidemia, unspecified: Secondary | ICD-10-CM | POA: Insufficient documentation

## 2021-06-12 ENCOUNTER — Encounter: Payer: Self-pay | Admitting: Cardiology

## 2021-06-12 ENCOUNTER — Other Ambulatory Visit: Payer: Self-pay

## 2021-06-12 ENCOUNTER — Ambulatory Visit: Payer: Medicare HMO | Admitting: Cardiology

## 2021-06-12 VITALS — BP 122/80 | HR 66 | Ht 69.0 in | Wt 184.0 lb

## 2021-06-12 DIAGNOSIS — I480 Paroxysmal atrial fibrillation: Secondary | ICD-10-CM | POA: Diagnosis not present

## 2021-06-12 NOTE — Progress Notes (Signed)
Electrophysiology Office Note   Date:  06/12/2021   ID:  Deundre Thong, DOB [birth year], MRN [id number]  PCP:  Geralynn Ochs, MD  Cardiologist:  Bettina Gavia Primary Electrophysiologist:  Cruz Devilla Meredith Leeds, MD    Chief Complaint: AF   History of Present Illness: Pal Shell is a 75 y.o. male who is being seen today for the evaluation of AF at the request of Bettina Gavia, Hilton Cork, MD. Presenting today for electrophysiology evaluation.  He has a history significant for chronic lower extremity edema with venous insufficiency, paroxysmal atrial fibrillation, type 2 diabetes, hypertension.  He has been having more frequent episodes of atrial fibrillation.  He presented emergency room 05/25/2021 and spontaneously converted to sinus rhythm after 4 hours.  His potassium was found to be low at the time.  Today, he denies symptoms of palpitations, chest pain, shortness of breath, orthopnea, PND, lower extremity edema, claudication, dizziness, presyncope, syncope, bleeding, or neurologic sequela. The patient is tolerating medications without difficulties.  He has had 2 episodes of atrial fibrillation in the last year.  He states that each episode is lasted multiple hours.  The most recent episode lasted for 4 hours.  He feels a sense of uneasiness.  He does not have fatigue or shortness of breath during his episodes of atrial fibrillation.   Past Medical History:  Diagnosis Date   Atrial fibrillation ([place]) 09/2019   Atrial fibrillation with RVR ([facility]) 09/16/2019   Benign paroxysmal positional vertigo of left ear 01/21/2018   Hallpike positive left ear down for rotational nystagmus.  Formatting of this note might be different from the original. Hallpike positive left ear down for rotational nystagmus.   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 Continues with fullness in the ears with intermittent popping.  Causes intermittent unsteadiness.  Presents to discuss ventilation tube placement. EXAMINATION reveals normal external canals and tympanic membranes. PLAN: I am not sure if this is more an inner ear or middle ear problem.  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   Renal insufficiency 09/16/2019   Past Surgical History:  Procedure Laterality Date   GLAUCOMA SURGERY     MANDIBLE FRACTURE SURGERY     TONSILLECTOMY       Current Outpatient Medications  Medication Sig Dispense Refill   apixaban (ELIQUIS) 5 MG TABS tablet Take 1 tablet by mouth twice daily. Labs needed for further refills 180 tablet 0   atorvastatin (LIPITOR) 40 MG tablet TAKE 1 TABLET BY MOUTH ONCE DAILY AT  6  PM 90 tablet 3   brimonidine-timolol (COMBIGAN) 0.2-0.5 % ophthalmic solution Place 1 drop into both eyes 2 (two) times daily.     cetirizine (ZYRTEC) 10 MG tablet Take 10 mg by mouth at bedtime.     diltiazem (CARDIZEM) 30 MG tablet Take 1 tablet (30 mg total) by mouth every 4 (four) hours as needed. For heart rate greater than 120 or in Atrial fib 60 tablet 0   dorzolamide (TRUSOPT) 2 % ophthalmic solution 1 drop 3 (three) times daily.     eplerenone (INSPRA) 25 MG tablet Take 1 tablet (25 mg total) by  mouth daily. 90 tablet 3   hydrALAZINE (APRESOLINE) 25 MG tablet Take 1/2 (one-half) tablet by mouth twice daily 90 tablet 3   potassium chloride SA (KLOR-CON M20) 20 MEQ tablet Take 1 tablet (20 mEq total) by mouth 2 (two) times daily. 60 tablet 2   telmisartan (MICARDIS) 40 MG tablet TAKE 1 TABLET BY MOUTH ONCE DAILY AT  10  PM 90 tablet 3   No current facility-administered medications for this visit.    Allergies:    Amoxicillin and Hydrocodone-acetaminophen   Social History:  The patient  reports that he has never smoked. He has never used smokeless tobacco. He reports current alcohol use. He reports that he does not use drugs.   Family History:  The patient's family history includes Asthma in his father; Glaucoma in his father; Heart attack in his father.    ROS:  Please see the history of present illness.   Otherwise, review of systems is positive for none.   All other systems are reviewed and negative.    PHYSICAL EXAM: VS:  There were no vitals taken for this visit. , BMI There is no height or weight on file to calculate BMI. GEN: Well nourished, well developed, in no acute distress  HEENT: normal  Neck: no JVD, carotid bruits, or masses Cardiac: RRR; no murmurs, rubs, or gallops,no edema  Respiratory:  clear to auscultation bilaterally, normal work of breathing GI: soft, nontender, nondistended, + BS MS: no deformity or atrophy  Skin: warm and dry Neuro:  Strength and sensation are intact Psych: euthymic mood, full affect  EKG:  EKG is not ordered today. Personal review of the ekg ordered 06/06/21 shows sinus rhythm, rate 49  Recent Labs: 05/25/2021: Hemoglobin 15.1; Platelets 225 06/06/2021: BUN 23; Creatinine, Ser 1.07; Magnesium 2.2; Potassium 4.4; Sodium 140    Lipid Panel     Component Value Date/Time   CHOL 217 (H) 09/16/2019 0431   TRIG 132 09/16/2019 0431   HDL 42 09/16/2019 0431   CHOLHDL 5.2 09/16/2019 0431   VLDL 26 09/16/2019 0431   LDLCALC 149 (H) 09/16/2019 0431     Wt Readings from Last 3 Encounters:  06/06/21 180 lb (81.6 kg)  05/25/21 177 lb (80.3 kg)  04/25/20 176 lb 1.3 oz (79.9 kg)      Other studies Reviewed: Additional studies/ records that were reviewed today include: TTE 09/16/19  Review of the above records today demonstrates:   1. Left ventricular ejection fraction, by visual estimation, is 50 to  55%. The left ventricle has low normal function.  Left ventricular septal  wall thickness was mildly increased.   2. Left ventricular diastolic parameters are consistent with Grade I  diastolic dysfunction (impaired relaxation).   3. Global right ventricle has normal systolic function.The right  ventricular size is normal. Right vetricular wall thickness was not  assessed.   4. Left atrial size was normal.   5. Right atrial size was normal.   6. The mitral valve is normal in structure. Trivial mitral valve  regurgitation. No evidence of mitral stenosis.   7. The tricuspid valve is normal in structure. Tricuspid valve  regurgitation is trivial.   8. The aortic valve is tricuspid. Aortic valve regurgitation is trivial.  Mild aortic valve sclerosis without stenosis.   9. The pulmonic valve was normal in structure. Pulmonic valve  regurgitation is trivial.    ASSESSMENT AND PLAN:  1.  Paroxysmal atrial fibrillation: Was hypokalemic at his most recent episode.  Currently on  Eliquis 5 mg twice daily.  CHA2DS2-VASc of 4.  History of hypokalemia, antiarrhythmics may not be the best option.  He also has sinus bradycardia and thus it is potentially difficult to treat.  He is currently feeling well and has had minimal symptoms.  I did speak with him about the possibility of ablation.  He Kari Kerth think about this further with his wife.  2.  Hypertension: Currently well controlled  Case discussed with primary cardiology  Current medicines are reviewed at length with the patient today.   The patient does not have concerns regarding his medicines.  The following changes were made today:  none  Labs/ tests ordered today include:  No orders of the defined types were placed in this encounter.    Disposition:   FU with Ambrea Hegler 6 months  Signed, Mashell Sieben Meredith Leeds, MD  06/12/2021 2:20 PM     [facility] [address] [place] [address] [phone] (office) [phone] (fax)

## 2021-06-12 NOTE — Patient Instructions (Signed)
Medication Instructions:  Your physician recommends that you continue on your current medications as directed. Please refer to the Current Medication list given to you today.  *If you need a refill on your cardiac medications before your next appointment, please call your pharmacy*   Lab Work: None ordered   Testing/Procedures: None ordered   Follow-Up: At [facility], you and your health needs are our priority.  As part of our continuing mission to provide you with exceptional heart care, we have created designated Provider Care Teams.  These Care Teams include your primary Cardiologist (physician) and Advanced Practice Providers (APPs -  Physician Assistants and Nurse Practitioners) who all work together to provide you with the care you need, when you need it.   Your next appointment:   6 month(s)  The format for your next appointment:   In Person  Provider:   Allegra Lai, MD    Thank you for choosing [facility]!!   Trinidad Curet, RN [phone]   Other Instructions   Cardiac Ablation Cardiac ablation is a procedure to destroy (ablate) some heart tissue that is sending bad signals. These bad signals cause problems in heart rhythm. The heart has many areas that make these signals. If there are problems in these areas, they can make the heart beat in a way that is not normal. Destroying some tissues can help make the heart rhythm normal. Tell your doctor about: Any allergies you have. All medicines you are taking. These include vitamins, herbs, eye drops, creams, and over-the-counter medicines. Any problems you or family members have had with medicines that make you fall asleep (anesthetics). Any blood disorders you have. Any surgeries you have had. Any medical conditions you have, such as kidney failure. Whether you are pregnant or may be pregnant. What are the risks? This is a safe procedure. But problems may occur, including: Infection. Bruising and  bleeding. Bleeding into the chest. Stroke or blood clots. Damage to nearby areas of your body. Allergies to medicines or dyes. The need for a pacemaker if the normal system is damaged. Failure of the procedure to treat the problem. What happens before the procedure? Medicines Ask your doctor about: Changing or stopping your normal medicines. This is important. Taking aspirin and ibuprofen. Do not take these medicines unless your doctor tells you to take them. Taking other medicines, vitamins, herbs, and supplements. General instructions Follow instructions from your doctor about what you cannot eat or drink. Plan to have someone take you home from the hospital or clinic. If you will be going home right after the procedure, plan to have someone with you for 24 hours. Ask your doctor what steps will be taken to prevent infection. What happens during the procedure?  An IV tube will be put into one of your veins. You will be given a medicine to help you relax. The skin on your neck or groin will be numbed. A cut (incision) will be made in your neck or groin. A needle will be put through your cut and into a large vein. A tube (catheter) will be put into the needle. The tube will be moved to your heart. Dye may be put through the tube. This helps your doctor see your heart. Small devices (electrodes) on the tube will send out signals. A type of energy will be used to destroy some heart tissue. The tube will be taken out. Pressure will be held on your cut. This helps stop bleeding. A bandage will be put  over your cut. The exact procedure may vary among doctors and hospitals. What happens after the procedure? You will be watched until you leave the hospital or clinic. This includes checking your heart rate, breathing rate, oxygen, and blood pressure. Your cut will be watched for bleeding. You will need to lie still for a few hours. Do not drive for 24 hours or as long as your doctor tells  you. Summary Cardiac ablation is a procedure to destroy some heart tissue. This is done to treat heart rhythm problems. Tell your doctor about any medical conditions you may have. Tell him or her about all medicines you are taking to treat them. This is a safe procedure. But problems may occur. These include infection, bruising, bleeding, and damage to nearby areas of your body. Follow what your doctor tells you about food and drink. You may also be told to change or stop some of your medicines. After the procedure, do not drive for 24 hours or as long as your doctor tells you. This information is not intended to replace advice given to you by your health care provider. Make sure you discuss any questions you have with your health care provider. Document Revised: 07/02/2019 Document Reviewed: 07/02/2019 Elsevier Patient Education  2022 [employer].

## 2021-06-17 LAB — BASIC METABOLIC PANEL
BUN/Creatinine Ratio: 17 (ref 10–24)
BUN: 17 mg/dL (ref 8–27)
CO2: 22 mmol/L (ref 20–29)
Calcium: 9 mg/dL (ref 8.6–10.2)
Chloride: 107 mmol/L — ABNORMAL HIGH (ref 96–106)
Creatinine, Ser: 1.02 mg/dL (ref 0.76–1.27)
Glucose: 84 mg/dL (ref 70–99)
Potassium: 4.5 mmol/L (ref 3.5–5.2)
Sodium: 141 mmol/L (ref 134–144)
eGFR: 76 mL/min/{1.73_m2} (ref >59)

## 2021-06-19 ENCOUNTER — Telehealth: Payer: Self-pay

## 2021-06-19 ENCOUNTER — Telehealth: Payer: Self-pay | Admitting: Cardiology

## 2021-06-19 NOTE — Telephone Encounter (Signed)
Aware ok to stop hydralazine. Medication removed from med list. Patient verbalized understanding and agreeable to plan.

## 2021-06-19 NOTE — Telephone Encounter (Signed)
Patient is returning call to discuss lab results. 

## 2021-06-19 NOTE — Telephone Encounter (Signed)
Left message on patients voicemail to please return our call.   

## 2021-06-19 NOTE — Telephone Encounter (Signed)
Spoke with patient regarding results and recommendation.  Patient verbalizes understanding and is agreeable to plan of care. Advised patient to call back with any issues or concerns.  

## 2021-06-19 NOTE — Telephone Encounter (Signed)
-----   Message from Richardo Priest, MD sent at 06/17/2021  2:39 PM EDT ----- Normal or stable result

## 2021-06-19 NOTE — Telephone Encounter (Signed)
Patient is calling about his hydrALAZINE (APRESOLINE) 25 MG tablet. He wants to make sure that Dr. Curt Contreras is in agreement for him to stop taking this medication. His PCP had recommended for him to stop it.  When he came in on 10/31 for his consultation Dr. Curt Contreras he asked his opinion, patient states Dr. Curt Contreras agreed since he was on such a low dose it was okay to stop it.  However, after reviewing his after visit summary he noticed he doesn't say to stop taking hydralazine.  He wants to make sure Dr. Curt Contreras is in agreement to this.

## 2021-08-31 ENCOUNTER — Other Ambulatory Visit: Payer: Self-pay | Admitting: Cardiology

## 2021-09-22 ENCOUNTER — Telehealth: Payer: Self-pay | Admitting: Cardiology

## 2021-09-22 MED ORDER — SPIRONOLACTONE 25 MG PO TABS: 25.0000 mg | ORAL_TABLET | Freq: Every day | ORAL | 3 refills | Status: DC

## 2021-09-22 NOTE — Telephone Encounter (Signed)
Medication sent in as recommended by Dr. Bettina Gavia.   Encouraged patient to call back with any questions or concerns.

## 2021-09-22 NOTE — Telephone Encounter (Signed)
Pt c/o medication issue:  1. Name of Medication: eplerenone (INSPRA) 25 MG tablet  2. How are you currently taking this medication (dosage and times per day)? Not currently taking   3. Are you having a reaction (difficulty breathing--STAT)? No   4. What is your medication issue? Pharmacy is calling stating this medication is not covered by insurance. Requesting the alternative spironolactone or amiloride be prescribed as an alternative.

## 2021-09-29 ENCOUNTER — Other Ambulatory Visit: Payer: Self-pay | Admitting: Cardiology

## 2021-10-31 ENCOUNTER — Other Ambulatory Visit: Payer: Self-pay | Admitting: Cardiology

## 2021-11-07 ENCOUNTER — Telehealth: Payer: Self-pay | Admitting: Cardiology

## 2021-11-07 NOTE — Telephone Encounter (Signed)
Patient c/o Palpitations:  High priority if patient c/o lightheadedness, shortness of breath, or chest pain ? ?How long have you had palpitations/irregular HR/ Afib? Are you having the symptoms now?  ?Patent's wife states the patient went into afib last took. Took Diltiazem at 1:20 AM and 5:20 AM. Patient is no longer in afib today. ? ?Are you currently experiencing lightheadedness, SOB or CP?  ?No  ? ?Do you have a history of afib (atrial fibrillation) or irregular heart rhythm?  ?Yes  ? ?Have you checked your BP or HR? (document readings if available):  ?Last night (HR): ?158 ?149 ? ?This morning (10:00 AM): ?124/78  65 ? ?Are you experiencing any other symptoms?  ?Very tired, chest discomfort due to arthritis, chronic cough--would like to know if Dr. Bettina Gavia can prescribe something for this  ? ?

## 2021-11-07 NOTE — Telephone Encounter (Signed)
(  Duplicate call to the patient & his wife as I didn't realize Delfino Lovett had called the him)/ ? ?Did obtain updated vital signs: ? ?BP/ HR currently 140/82 (57).  ? ?The patient is currently asymptomatic.  ?I advised them to please continue with Diltiazem 30 mg q4 hours PRN for episodes of atrial fibrillation. ?I also advised that break through a-fib is common. ?However, I have advised that they please call us back for increased frequency of a-fib/ episodes lasting >12 hours for further recommendations. ? ?I have also advised that he keep his upcoming appointments in April with Dr. Bettina Gavia & May with Dr. Curt Bears.  ? ?The patient and his wife voiced understanding.  ?At that time, they advised Delfino Lovett had also called them and was forwarding to another physician to review in Dr. Joya Gaskins absence.  ? ?

## 2021-11-08 ENCOUNTER — Encounter: Payer: Self-pay | Admitting: Cardiology

## 2021-11-09 ENCOUNTER — Other Ambulatory Visit: Payer: Self-pay

## 2021-11-09 ENCOUNTER — Telehealth: Payer: Self-pay | Admitting: Cardiology

## 2021-11-09 MED ORDER — BENZONATATE 100 MG PO CAPS: 100.0000 mg | ORAL_CAPSULE | ORAL | 2 refills | Status: DC | PRN

## 2021-11-09 NOTE — Telephone Encounter (Signed)
? ?  Pt c/o medication issue: ? ?1. Name of Medication:  ? benzonatate (TESSALON PERLES) 100 MG capsule  ? ? ?2. How are you currently taking this medication (dosage and times per day)? Take 1 capsule (100 mg total) by mouth as needed for cough. ? ?3. Are you having a reaction (difficulty breathing--STAT)?  ? ?4. What is your medication issue? Pharmacy calling, the instruction is of this meds is incomplete. They need clarification ?

## 2021-11-09 NOTE — Telephone Encounter (Signed)
Called the pharmacy and clarified the frequency for the medication.  ?

## 2021-11-09 NOTE — Telephone Encounter (Signed)
Reviewed the following recommendations from Dr. Agustin Cree with the patient's wife: ? ?I am glad that he is back to normal weight and if palpitations recur he need to let us know.  In terms of exercise this should be fine for him to start it in terms of cough you can send him prescription for Tessalon Perles 100 mg every 8 hours as needed  ? ?Patient agreed with this plan and the prescription for Tessalon Perles was ordered. Patient had no further questions at this time. ?

## 2021-11-16 DIAGNOSIS — H2511 Age-related nuclear cataract, right eye: Secondary | ICD-10-CM | POA: Insufficient documentation

## 2021-11-28 ENCOUNTER — Telehealth: Payer: Self-pay | Admitting: Cardiology

## 2021-11-28 ENCOUNTER — Ambulatory Visit: Payer: Medicare HMO | Admitting: Cardiology

## 2021-11-28 ENCOUNTER — Encounter: Payer: Self-pay | Admitting: Cardiology

## 2021-11-28 VITALS — BP 128/80 | HR 51 | Ht 69.0 in | Wt 182.4 lb

## 2021-11-28 DIAGNOSIS — Z7901 Long term (current) use of anticoagulants: Secondary | ICD-10-CM

## 2021-11-28 DIAGNOSIS — I129 Hypertensive chronic kidney disease with stage 1 through stage 4 chronic kidney disease, or unspecified chronic kidney disease: Secondary | ICD-10-CM | POA: Diagnosis not present

## 2021-11-28 DIAGNOSIS — I480 Paroxysmal atrial fibrillation: Secondary | ICD-10-CM | POA: Diagnosis not present

## 2021-11-28 DIAGNOSIS — E782 Mixed hyperlipidemia: Secondary | ICD-10-CM | POA: Diagnosis not present

## 2021-11-28 NOTE — Telephone Encounter (Signed)
Spoke with pt and his wife who states that the at [place] he was back in afib. Pt states at his appt with Dr. Bettina Gavia Oct 2022 he was told to come in to the office rather than going to the ED.  ?0615 HR 73 ?0725 HR 156 took dose of Diltazem ?0813 HR 155 ?0851 wife is unable to take BP states the first I heard it was 150 but unable to get diastolic due to it being son irregular. ? ?Appt has been changed from 4/20 to today at 1:00. ?

## 2021-11-28 NOTE — Telephone Encounter (Signed)
Patient c/o Palpitations:  High priority if patient c/o lightheadedness, shortness of breath, or chest pain ? ?How long have you had palpitations/irregular HR/ Afib? Are you having the symptoms now? This morning, yes ? ?Are you currently experiencing lightheadedness, SOB or CP? no ? ?Do you have a history of afib (atrial fibrillation) or irregular heart rhythm? yes ? ?Have you checked your BP or HR? (document readings if available): HR 73, 156 ? ?Are you experiencing any other symptoms?  Being urinating heavily  ? ?

## 2021-11-28 NOTE — Patient Instructions (Signed)
Medication Instructions:  ?Your physician recommends that you continue on your current medications as directed. Please refer to the Current Medication list given to you today. ? ?*If you need a refill on your cardiac medications before your next appointment, please call your pharmacy* ? ? ?Lab Work: ?Your physician recommends that you return for lab work in:  ? ?Labs today in [address]: BMP, Pro BNP, Magnesium ? ?If you have labs (blood work) drawn today and your tests are completely normal, you will receive your results only by: ?MyChart Message (if you have MyChart) OR ?A paper copy in the mail ?If you have any lab test that is abnormal or we need to change your treatment, we will call you to review the results. ? ? ?Testing/Procedures: ?None ? ? ?Follow-Up: ?At [facility], you and your health needs are our priority.  As part of our continuing mission to provide you with exceptional heart care, we have created designated Provider Care Teams.  These Care Teams include your primary Cardiologist (physician) and Advanced Practice Providers (APPs -  Physician Assistants and Nurse Practitioners) who all work together to provide you with the care you need, when you need it. ? ?We recommend signing up for the patient portal called "MyChart".  Sign up information is provided on this After Visit Summary.  MyChart is used to connect with patients for Virtual Visits (Telemedicine).  Patients are able to view lab/test results, encounter notes, upcoming appointments, etc.  Non-urgent messages can be sent to your provider as well.   ?To learn more about what you can do with MyChart, go to [email or web address].   ? ?Your next appointment:   ?6 month(s) ? ?The format for your next appointment:   ?In Person ? ?Provider:   ?Shirlee More, MD  ? ? ?Other Instructions ?None ? ?Important Information About Sugar ? ? ? ? ? ? ?

## 2021-11-28 NOTE — Progress Notes (Signed)
?Cardiology Office Note:   ? ?Date:  11/28/2021  ? ?ID:  Jeffrey Contreras, DOB Apr 16, 1946, MRN [id number] ? ?PCP:  Geralynn Ochs, MD  ?Cardiologist:  Shirlee More, MD   ? ?Referring MD: Geralynn Ochs, MD  ? ? ?ASSESSMENT:   ? ?1. Paroxysmal atrial fibrillation (HCC)   ?2. Chronic anticoagulation   ?3. Benign hypertension with chronic kidney disease   ?4. Mixed hyperlipidemia   ? ?PLAN:   ? ?In order of problems listed above: ? ?Recurrent paroxysmal atrial fibrillation I think he is best served by EP catheter ablation continue anticoagulation and as needed calcium channel blocker for rate control ?Recheck electrolytes magnesium previously hypokalemia provoked an episode ?Hypertension well controlled he takes MRA and ARB ?Continue his statin ? ? ?Next appointment: 3 months ? ? ?Medication Adjustments/Labs and Tests Ordered: ?Current medicines are reviewed at length with the patient today.  Concerns regarding medicines are outlined above.  ?Orders Placed This Encounter  ?Procedures  ? Basic Metabolic Panel (BMET)  ? Pro b natriuretic peptide (BNP)[facility])  ? Magnesium  ? EKG 12-Lead  ? ?No orders of the defined types were placed in this encounter. ? ? ?Chief Complaint  ?Patient presents with  ? Follow-up  ? Atrial Fibrillation  ?  Rapid HR this morning   ? ? ?History of Present Illness:   ? ?Jeffrey Contreras is a 76 y.o. male with a hx of chronic lower extremity edema with venous insufficiency paroxysmal atrial fibrillation chronic anticoagulation type 2 diabetes mellitus hypertensive heart disease  last seen 06/06/2021 maintaining sinus rhythm.  In consultation by EP 06/12/2021 and offered ablation.  He declined at that time. ? ?Compliance with diet, lifestyle and medications: Yes ? ?1 episode of atrial fibrillation in March, loose morning lasted few hours captured on his mobile [place].  I reviewed the strips clearly atrial fibrillation rate in the 140s and 150s and terminated in  about 2-1/2 hours after taking oral Cardizem. ?He has an appointment is seen by EP in May and is interested in EP catheter ablation ?Otherwise well blood pressure at target resting heart rate at times is in the high 40s no angina edema shortness of breath or syncope. ?He tolerates his anticoagulant without bleeding and statin without muscle pain or weakness ?Past Medical History:  ?Diagnosis Date  ? Atrial fibrillation ([place]) 09/2019  ? Atrial fibrillation with RVR ([place]) 09/16/2019  ? Benign paroxysmal positional vertigo of left ear 01/21/2018  ? Hallpike positive left ear down for rotational nystagmus.  Formatting of this note might be different from the original. Hallpike positive left ear down for rotational nystagmus.  ? 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 Costochondritis   ? required injections  ? Diabetes mellitus without complication ([place])   ? Dysfunction of left eustachian tube 02/11/2017  ? Last Assessment & Plan:  Follow-up persistent fullness in the ears. Continues with fullness in the ears with intermittent popping.  Causes intermittent unsteadiness.  Presents to discuss ventilation tube placement. EXAMINATION reveals normal external canals and tympanic membranes. PLAN: I am not sure if this is more an inner ear or middle ear problem.  We discussed diagnostic myringotomy to better  ? Essential hypertension 09/16/2019  ? Glaucoma   ? Hypertension   ? Hypokalemia 09/16/2019  ? 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 Renal insufficiency 09/16/2019  ? ? ?Past Surgical History:  ?Procedure Laterality Date  ? GLAUCOMA SURGERY    ? MANDIBLE  FRACTURE SURGERY    ? TONSILLECTOMY    ? ? ?Current Medications: ?Current Meds  ?Medication Sig  ? atorvastatin (LIPITOR) 40 MG tablet TAKE 1 TABLET BY MOUTH ONCE DAILY AT  6  PM  ? benzonatate (TESSALON PERLES) 100 MG capsule Take 1 capsule (100 mg total) by mouth as needed for cough.  ? brimonidine-timolol (COMBIGAN) 0.2-0.5 %  ophthalmic solution Place 1 drop into both eyes 2 (two) times daily.  ? cetirizine (ZYRTEC) 10 MG tablet Take 10 mg by mouth at bedtime.  ? diltiazem (CARDIZEM) 30 MG tablet TAKE 1 TABLET BY MOUTH EVERY 4 HOURS AS NEEDED FOR  HEART  RATE  GREATER  THAN  120  OR  IN  A-FIB  ? dorzolamide (TRUSOPT) 2 % ophthalmic solution 1 drop 3 (three) times daily.  ? ELIQUIS 5 MG TABS tablet TAKE 1 TABLET BY MOUTH TWICE DAILY . APPOINTMENT REQUIRED FOR FUTURE REFILLS  ? eplerenone (INSPRA) 25 MG tablet Take 25 mg by mouth daily.  ? potassium chloride SA (KLOR-CON M) 20 MEQ tablet Take 1 tablet by mouth twice daily  ? telmisartan (MICARDIS) 40 MG tablet TAKE 1 TABLET BY MOUTH ONCE DAILY AT  10  PM  ? [DISCONTINUED] spironolactone (ALDACTONE) 25 MG tablet Take 1 tablet (25 mg total) by mouth daily.  ?  ? ?Allergies:   Amoxicillin and Hydrocodone-acetaminophen  ? ?Social History  ? ?Socioeconomic History  ? Marital status: Married  ?  Spouse name: Not on file  ? Number of children: Not on file  ? Years of education: Not on file  ? Highest education level: Not on file  ?Occupational History  ? Not on file  ?Tobacco Use  ? Smoking status: Never  ? Smokeless tobacco: Never  ?Vaping Use  ? Vaping Use: Never used  ?Substance and Sexual Activity  ? Alcohol use: Yes  ?  Comment: occ  ? Drug use: Never  ? Sexual activity: Yes  ?Other Topics Concern  ? Not on file  ?Social History Narrative  ? Not on file  ? ?Social Determinants of Health  ? ?Financial Resource Strain: Not on file  ?Food Insecurity: Not on file  ?Transportation Needs: Not on file  ?Physical Activity: Not on file  ?Stress: Not on file  ?Social Connections: Not on file  ?  ? ?Family History: ?The patient's family history includes Asthma in his father; Glaucoma in his father; Heart attack in his father. ?ROS:   ?Please see the history of present illness.    ?All other systems reviewed and are negative. ? ?EKGs/Labs/Other Studies Reviewed:   ? ?The following studies were reviewed  today: ? ?EKG:  EKG ordered today and personally reviewed.  The ekg ordered today demonstrates sinus bradycardia 51 bpm ? ?Recent Labs: ?05/25/2021: Hemoglobin 15.1; Platelets 225 ?06/06/2021: Magnesium 2.2 ?06/16/2021: BUN 17; Creatinine, Ser 1.02; Potassium 4.5; Sodium 141  ?Recent Lipid Panel ?   ?Component Value Date/Time  ? CHOL 217 (H) 09/16/2019 0431  ? TRIG 132 09/16/2019 0431  ? HDL 42 09/16/2019 0431  ? CHOLHDL 5.2 09/16/2019 0431  ? VLDL 26 09/16/2019 0431  ? LDLCALC 149 (H) 09/16/2019 0431  ? ? ?Physical Exam:   ? ?VS:  BP 128/80   Pulse (!) 51   Ht '5\' 9"'$  (1.753 m)   Wt 182 lb 6.4 oz (82.7 kg)   SpO2 98%   BMI 26.94 kg/m?    ? ?Wt Readings from Last 3 Encounters:  ?11/28/21 182 lb 6.4 oz (  82.7 kg)  ?06/12/21 184 lb (83.5 kg)  ?06/06/21 180 lb (81.6 kg)  ?  ? ?GEN:  Well nourished, well developed in no acute distress ?HEENT: Normal ?NECK: No JVD; No carotid bruits ?LYMPHATICS: No lymphadenopathy ?CARDIAC: RRR, no murmurs, rubs, gallops ?RESPIRATORY:  Clear to auscultation without rales, wheezing or rhonchi  ?ABDOMEN: Soft, non-tender, non-distended ?MUSCULOSKELETAL:  No edema; No deformity  ?SKIN: Warm and dry ?NEUROLOGIC:  Alert and oriented x 3 ?PSYCHIATRIC:  Normal affect  ? ? ?Signed, ?Shirlee More, MD  ?11/28/2021 1:31 PM    ?[place]  ?

## 2021-11-29 ENCOUNTER — Telehealth: Payer: Self-pay | Admitting: Cardiology

## 2021-11-29 ENCOUNTER — Other Ambulatory Visit: Payer: Self-pay

## 2021-11-29 ENCOUNTER — Other Ambulatory Visit: Payer: Self-pay | Admitting: Cardiology

## 2021-11-29 LAB — BASIC METABOLIC PANEL
BUN/Creatinine Ratio: 19 (ref 10–24)
BUN: 18 mg/dL (ref 8–27)
CO2: 23 mmol/L (ref 20–29)
Calcium: 9.8 mg/dL (ref 8.6–10.2)
Chloride: 107 mmol/L — ABNORMAL HIGH (ref 96–106)
Creatinine, Ser: 0.95 mg/dL (ref 0.76–1.27)
Glucose: 105 mg/dL — ABNORMAL HIGH (ref 70–99)
Potassium: 5.4 mmol/L — ABNORMAL HIGH (ref 3.5–5.2)
Sodium: 144 mmol/L (ref 134–144)
eGFR: 82 mL/min/{1.73_m2} (ref >59)

## 2021-11-29 LAB — PRO B NATRIURETIC PEPTIDE: NT-Pro BNP: 475 pg/mL (ref 0–486)

## 2021-11-29 LAB — MAGNESIUM: Magnesium: 2 mg/dL (ref 1.6–2.3)

## 2021-11-29 NOTE — Telephone Encounter (Signed)
New message ? ? ?Pt would like to speak to RN, he would like to make sure Dr. Curt Bears is comfortable having cataract surgery.  ?

## 2021-11-30 ENCOUNTER — Ambulatory Visit: Payer: Medicare HMO | Admitting: Cardiology

## 2021-11-30 NOTE — Telephone Encounter (Signed)
Prescription refill request for Eliquis received. ?Indication:Afib ?Last office visit:4/23 ?Scr:0.9 ?Age: 76 ?Weight:82.7 kg ? ?Prescription refilled ? ?

## 2021-11-30 NOTE — Telephone Encounter (Signed)
Returned pt call. ?Pt having cataract surgery next Tuesday and asking if Dr. Curt Bears thinks its ok for him to proceed. ? ?He reports he has infrequency AFib episodes, maybe once a month.  Last epidosde this past Tuesday, lasted about 2 1/2 hours.  Episode before that in [place] that last 6-8 hours and took PRN Diltiazem.  Compliant w/ Eliquis and not missed any doses. ?He does NOT need to stop it for procedure. ? ?Pt scheduled for routine follow up with Dr. Curt Bears this coming Monday 4/24. ?Aware I will forward to [facility] for advisement if ok to proceed at this point.  Aware that if he says ok to proceed and something is concerning at [place], then may change approval if given. ?Pt understands this and appreciative of return call. ? ?

## 2021-12-04 ENCOUNTER — Encounter: Payer: Self-pay | Admitting: Cardiology

## 2021-12-04 ENCOUNTER — Encounter: Payer: Self-pay | Admitting: *Deleted

## 2021-12-04 ENCOUNTER — Ambulatory Visit: Payer: Medicare HMO | Admitting: Cardiology

## 2021-12-04 VITALS — BP 148/94 | HR 56 | Ht 68.0 in | Wt 184.8 lb

## 2021-12-04 DIAGNOSIS — Z01812 Encounter for preprocedural laboratory examination: Secondary | ICD-10-CM

## 2021-12-04 DIAGNOSIS — I480 Paroxysmal atrial fibrillation: Secondary | ICD-10-CM

## 2021-12-04 DIAGNOSIS — D6869 Other thrombophilia: Secondary | ICD-10-CM

## 2021-12-04 NOTE — Progress Notes (Signed)
? ?Electrophysiology Office Note ? ? ?Date:  12/04/2021  ? ?ID:  Jeffrey Contreras, DOB [birth year], MRN [id number] ? ?PCP:  Geralynn Ochs, MD  ?Cardiologist:  Bettina Gavia ?Primary Electrophysiologist:  Solana Coggin Meredith Leeds, MD   ? ?Chief Complaint: AF ?  ?History of Present Illness: ?Jeffrey Contreras is a 76 y.o. male who is being seen today for the evaluation of AF at the request of Geralynn Ochs, MD. Presenting today for electrophysiology evaluation. ? ?He has a history significant for chronic lower extremity edema with venous insufficiency, paroxysmal atrial fibrillation, type 2 diabetes, hypertension.  He was having more frequent episodes of atrial fibrillation and presented to the emergency room.  He spontaneously converted to sinus rhythm.  His potassium was found to be low at the time. ? ?Today, denies symptoms of palpitations, chest pain, shortness of breath, orthopnea, PND, lower extremity edema, claudication, dizziness, presyncope, syncope, bleeding, or neurologic sequela. The patient is tolerating medications without difficulties.  Since being seen he has continued to have episodes of atrial fibrillation.  His episodes last between 2 and 2-1/2 hours at a time.  He feels off, but he cannot fully describe how he feels when he is in atrial fibrillation.  He is not short of breath and does not have much fatigue. ? ? ?Past Medical History:  ?Diagnosis Date  ? Atrial fibrillation ([place]) 09/2019  ? Atrial fibrillation with RVR ([place]) 09/16/2019  ? Benign paroxysmal positional vertigo of left ear 01/21/2018  ? Hallpike positive left ear down for rotational nystagmus.  Formatting of this note might be different from the original. Hallpike positive left ear down for rotational nystagmus.  ? 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 Costochondritis   ? required injections  ? Diabetes mellitus without complication ([place])   ? Dysfunction of left eustachian tube 02/11/2017   ? Last Assessment & Plan:  Follow-up persistent fullness in the ears. Continues with fullness in the ears with intermittent popping.  Causes intermittent unsteadiness.  Presents to discuss ventilation tube placement. EXAMINATION reveals normal external canals and tympanic membranes. PLAN: I am not sure if this is more an inner ear or middle ear problem.  We discussed diagnostic myringotomy to better  ? Essential hypertension 09/16/2019  ? Glaucoma   ? Hypertension   ? Hypokalemia 09/16/2019  ? 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 Renal insufficiency 09/16/2019  ? ?Past Surgical History:  ?Procedure Laterality Date  ? GLAUCOMA SURGERY    ? MANDIBLE FRACTURE SURGERY    ? TONSILLECTOMY    ? ? ? ?Current Outpatient Medications  ?Medication Sig Dispense Refill  ? apixaban (ELIQUIS) 5 MG TABS tablet Take 1 tablet (5 mg total) by mouth 2 (two) times daily. 180 tablet 1  ? atorvastatin (LIPITOR) 40 MG tablet TAKE 1 TABLET BY MOUTH ONCE DAILY AT  6  PM 90 tablet 3  ? benzonatate (TESSALON PERLES) 100 MG capsule Take 1 capsule (100 mg total) by mouth as needed for cough. 20 capsule 2  ? brimonidine-timolol (COMBIGAN) 0.2-0.5 % ophthalmic solution Place 1 drop into both eyes 2 (two) times daily.    ? diltiazem (CARDIZEM) 30 MG tablet TAKE 1 TABLET BY MOUTH EVERY 4 HOURS AS NEEDED FOR  HEART  RATE  GREATER  THAN  120  OR  IN  A-FIB 60 tablet 0  ? dorzolamide (TRUSOPT) 2 % ophthalmic solution 1 drop 3 (three) times daily.    ? eplerenone (INSPRA) 25 MG tablet Take  25 mg by mouth daily.    ? fluticasone (FLONASE) 50 MCG/ACT nasal spray Place 1 spray into both nostrils at bedtime as needed for allergies or rhinitis.    ? loratadine (CLARITIN) 10 MG tablet Take 10 mg by mouth daily as needed for allergies.    ? OVER THE COUNTER MEDICATION Take 1,000 mg by mouth daily.    ? telmisartan (MICARDIS) 40 MG tablet TAKE 1 TABLET BY MOUTH ONCE DAILY AT  10  PM 90 tablet 3  ? ?No current facility-administered medications for this visit.   ? ? ?Allergies:   Amoxicillin, Hydrocodone-acetaminophen, and Oxycodone  ? ?Social History:  The patient  reports that he has never smoked. He has never used smokeless tobacco. He reports current alcohol use. He reports that he does not use drugs.  ? ?Family History:  The patient's family history includes Asthma in his father; Glaucoma in his father; Heart attack in his father.  ? ?ROS:  Please see the history of present illness.   Otherwise, review of systems is positive for none.   All other systems are reviewed and negative.  ? ?PHYSICAL EXAM: ?VS:  BP (!) 148/94   Pulse (!) 56   Ht '5\' 8"'$  (1.727 m)   Wt 184 lb 12.8 oz (83.8 kg)   SpO2 96%   BMI 28.10 kg/m?  , BMI Body mass index is 28.1 kg/m?. ?GEN: Well nourished, well developed, in no acute distress  ?HEENT: normal  ?Neck: no JVD, carotid bruits, or masses ?Cardiac: RRR; no murmurs, rubs, or gallops,no edema  ?Respiratory:  clear to auscultation bilaterally, normal work of breathing ?GI: soft, nontender, nondistended, + BS ?MS: no deformity or atrophy  ?Skin: warm and dry ?Neuro:  Strength and sensation are intact ?Psych: euthymic mood, full affect ? ?EKG:  EKG is not ordered today. ?Personal review of the ekg ordered 11/29/21 shows sinus rhythm, rate 51 ? ?Recent Labs: ?05/25/2021: Hemoglobin 15.1; Platelets 225 ?11/28/2021: BUN 18; Creatinine, Ser 0.95; Magnesium 2.0; NT-Pro BNP 475; Potassium 5.4; Sodium 144  ? ? ?Lipid Panel  ?   ?Component Value Date/Time  ? CHOL 217 (H) 09/16/2019 0431  ? TRIG 132 09/16/2019 0431  ? HDL 42 09/16/2019 0431  ? CHOLHDL 5.2 09/16/2019 0431  ? VLDL 26 09/16/2019 0431  ? LDLCALC 149 (H) 09/16/2019 0431  ? ? ? ?Wt Readings from Last 3 Encounters:  ?12/04/21 184 lb 12.8 oz (83.8 kg)  ?11/28/21 182 lb 6.4 oz (82.7 kg)  ?06/12/21 184 lb (83.5 kg)  ?  ? ? ?Other studies Reviewed: ?Additional studies/ records that were reviewed today include: TTE 09/16/19  ?Review of the above records today demonstrates:  ? 1. Left ventricular  ejection fraction, by visual estimation, is 50 to  ?55%. The left ventricle has low normal function. Left ventricular septal  ?wall thickness was mildly increased.  ? 2. Left ventricular diastolic parameters are consistent with Grade I  ?diastolic dysfunction (impaired relaxation).  ? 3. Global right ventricle has normal systolic function.The right  ?ventricular size is normal. Right vetricular wall thickness was not  ?assessed.  ? 4. Left atrial size was normal.  ? 5. Right atrial size was normal.  ? 6. The mitral valve is normal in structure. Trivial mitral valve  ?regurgitation. No evidence of mitral stenosis.  ? 7. The tricuspid valve is normal in structure. Tricuspid valve  ?regurgitation is trivial.  ? 8. The aortic valve is tricuspid. Aortic valve regurgitation is trivial.  ?Mild aortic valve  sclerosis without stenosis.  ? 9. The pulmonic valve was normal in structure. Pulmonic valve  ?regurgitation is trivial.  ? ? ?ASSESSMENT AND PLAN: ? ?1.  Paroxysmal atrial fibrillation: Currently on Eliquis 5 mg twice daily.  CHA2DS2-VASc of 4.  At this point, he would prefer to avoid medication management.  He has had more frequent episodes of atrial fibrillation.  He would prefer ablation.  Risk and benefits were discussed.  He understands these risks and is agreed to the procedure. ? ?Risk, benefits, and alternatives to EP study and radiofrequency ablation for afib were also discussed in detail today. These risks include but are not limited to stroke, bleeding, vascular damage, tamponade, perforation, damage to the esophagus, lungs, and other structures, pulmonary vein stenosis, worsening renal function, and death. The patient understands these risk and wishes to proceed.  We Branden Vine therefore proceed with catheter ablation at the next available time.  Carto, ICE, anesthesia are requested for the procedure.  Arriona Prest also obtain CT PV protocol prior to the procedure to exclude LAA thrombus and further evaluate atrial  anatomy.  ? ?2.  Hypertension: Mildly elevated today but usually well controlled ? ?3.  Secondary hypercoagulable state: Currently on Eliquis for atrial fibrillation as above. ? ?Case discussed with primary cardiology ?

## 2021-12-04 NOTE — Patient Instructions (Signed)
Medication Instructions:  Your physician recommends that you continue on your current medications as directed. Please refer to the Current Medication list given to you today.  *If you need a refill on your cardiac medications before your next appointment, please call your pharmacy*   Lab Work: Pre procedure labs - see procedure instruction letter:  BMP & CBC  If you have labs (blood work) drawn today and your tests are completely normal, you will receive your results only by: MyChart Message (if you have MyChart) OR A paper copy in the mail If you have any lab test that is abnormal or we need to change your treatment, we will call you to review the results.   Testing/Procedures: Your physician has requested that you have cardiac CT within 7 days PRIOR to your ablation. Cardiac computed tomography (CT) is a painless test that uses an x-ray machine to take clear, detailed pictures of your heart.  Please follow instruction below located under "other instructions". You will get a call from our office to schedule the date for this test.  Your physician has recommended that you have an ablation. Catheter ablation is a medical procedure used to treat some cardiac arrhythmias (irregular heartbeats). During catheter ablation, a long, thin, flexible tube is put into a blood vessel in your groin (upper thigh), or neck. This tube is called an ablation catheter. It is then guided to your heart through the blood vessel. Radio frequency waves destroy small areas of heart tissue where abnormal heartbeats may cause an arrhythmia to start. Please follow instruction letter given to you today.   Follow-Up: At [facility], you and your health needs are our priority.  As part of our continuing mission to provide you with exceptional heart care, we have created designated Provider Care Teams.  These Care Teams include your primary Cardiologist (physician) and Advanced Practice Providers (APPs -  Physician  Assistants and Nurse Practitioners) who all work together to provide you with the care you need, when you need it.  Your next appointment:   1 month(s) after your ablation  The format for your next appointment:   In Person  Provider:   AFib clinic   Thank you for choosing [facility]!!   Jonathon Tan, RN [phone]    Other Instructions  Cardiac Ablation Cardiac ablation is a procedure to destroy (ablate) some heart tissue that is sending bad signals. These bad signals cause problems in heart rhythm. The heart has many areas that make these signals. If there are problems in these areas, they can make the heart beat in a way that is not normal. Destroying some tissues can help make the heart rhythm normal. Tell your doctor about: Any allergies you have. All medicines you are taking. These include vitamins, herbs, eye drops, creams, and over-the-counter medicines. Any problems you or family members have had with medicines that make you fall asleep (anesthetics). Any blood disorders you have. Any surgeries you have had. Any medical conditions you have, such as kidney failure. Whether you are pregnant or may be pregnant. What are the risks? This is a safe procedure. But problems may occur, including: Infection. Bruising and bleeding. Bleeding into the chest. Stroke or blood clots. Damage to nearby areas of your body. Allergies to medicines or dyes. The need for a pacemaker if the normal system is damaged. Failure of the procedure to treat the problem. What happens before the procedure? Medicines Ask your doctor about: Changing or stopping your normal medicines. This is   important. Taking aspirin and ibuprofen. Do not take these medicines unless your doctor tells you to take them. Taking other medicines, vitamins, herbs, and supplements. General instructions Follow instructions from your doctor about what you cannot eat or drink. Plan to have someone take you home  from the hospital or clinic. If you will be going home right after the procedure, plan to have someone with you for 24 hours. Ask your doctor what steps will be taken to prevent infection. What happens during the procedure?  An IV tube will be put into one of your veins. You will be given a medicine to help you relax. The skin on your neck or groin will be numbed. A cut (incision) will be made in your neck or groin. A needle will be put through your cut and into a large vein. A tube (catheter) will be put into the needle. The tube will be moved to your heart. Dye may be put through the tube. This helps your doctor see your heart. Small devices (electrodes) on the tube will send out signals. A type of energy will be used to destroy some heart tissue. The tube will be taken out. Pressure will be held on your cut. This helps stop bleeding. A bandage will be put over your cut. The exact procedure may vary among doctors and hospitals. What happens after the procedure? You will be watched until you leave the hospital or clinic. This includes checking your heart rate, breathing rate, oxygen, and blood pressure. Your cut will be watched for bleeding. You will need to lie still for a few hours. Do not drive for 24 hours or as long as your doctor tells you. Summary Cardiac ablation is a procedure to destroy some heart tissue. This is done to treat heart rhythm problems. Tell your doctor about any medical conditions you may have. Tell him or her about all medicines you are taking to treat them. This is a safe procedure. But problems may occur. These include infection, bruising, bleeding, and damage to nearby areas of your body. Follow what your doctor tells you about food and drink. You may also be told to change or stop some of your medicines. After the procedure, do not drive for 24 hours or as long as your doctor tells you. This information is not intended to replace advice given to you by your  health care provider. Make sure you discuss any questions you have with your health care provider. Document Revised: 07/02/2019 Document Reviewed: 07/02/2019 Elsevier Patient Education  2023 [employer].  

## 2021-12-08 ENCOUNTER — Encounter: Payer: Self-pay | Admitting: Cardiology

## 2021-12-15 ENCOUNTER — Encounter: Payer: Self-pay | Admitting: Cardiology

## 2021-12-15 ENCOUNTER — Telehealth: Payer: Self-pay

## 2021-12-15 NOTE — Telephone Encounter (Signed)
Scheduled nurse visit for 3 day event monitor per Dr. Bettina Gavia. Pt agreed on tinme and date. He had no other questions.  ?

## 2021-12-21 ENCOUNTER — Ambulatory Visit: Payer: Medicare HMO

## 2021-12-22 ENCOUNTER — Other Ambulatory Visit: Payer: Medicare HMO

## 2021-12-22 ENCOUNTER — Other Ambulatory Visit: Payer: Self-pay

## 2021-12-22 ENCOUNTER — Ambulatory Visit (INDEPENDENT_AMBULATORY_CARE_PROVIDER_SITE_OTHER): Payer: Medicare HMO

## 2021-12-22 ENCOUNTER — Ambulatory Visit: Payer: Medicare HMO

## 2021-12-22 DIAGNOSIS — R001 Bradycardia, unspecified: Secondary | ICD-10-CM

## 2021-12-22 NOTE — Patient Instructions (Signed)
Zio monitor applied  ?

## 2021-12-28 ENCOUNTER — Ambulatory Visit: Payer: Medicare HMO | Admitting: Cardiology

## 2022-01-01 ENCOUNTER — Ambulatory Visit: Payer: Medicare HMO | Admitting: Cardiology

## 2022-01-05 ENCOUNTER — Telehealth: Payer: Self-pay | Admitting: *Deleted

## 2022-01-05 NOTE — Telephone Encounter (Signed)
Let pt know monitor results and he was happy to hear that it was a good result. He and his wife wanted to make sure they don't need to be concerned about his heart rate going below 40 during sleep. Apple watch will alert pt 2-3 times a week that heart rate has gone below 40 for 5-10 minutes during the night. PLease advise.

## 2022-01-05 NOTE — Telephone Encounter (Signed)
-----   Message from Richardo Priest, MD sent at 01/05/2022 12:37 PM EDT ----- Good result we did this because of concerns of bradycardia he is not having pauses or episodes of heart block and we do not need to consider a pacemaker.

## 2022-01-10 NOTE — Telephone Encounter (Signed)
Let pt know that Dr. Bettina Gavia is good with pt's heart rate. He verbalized understanding and was happy to hear it.

## 2022-02-22 LAB — CBC
Hematocrit: 44.3 % (ref 37.5–51.0)
Hemoglobin: 15.2 g/dL (ref 13.0–17.7)
MCH: 30.5 pg (ref 26.6–33.0)
MCHC: 34.3 g/dL (ref 31.5–35.7)
MCV: 89 fL (ref 79–97)
Platelets: 242 10*3/uL (ref 150–450)
RBC: 4.98 x10E6/uL (ref 4.14–5.80)
RDW: 11.8 % (ref 11.6–15.4)
WBC: 5.5 10*3/uL (ref 3.4–10.8)

## 2022-02-22 LAB — BASIC METABOLIC PANEL
BUN/Creatinine Ratio: 19 (ref 10–24)
BUN: 20 mg/dL (ref 8–27)
CO2: 22 mmol/L (ref 20–29)
Calcium: 9.1 mg/dL (ref 8.6–10.2)
Chloride: 104 mmol/L (ref 96–106)
Creatinine, Ser: 1.05 mg/dL (ref 0.76–1.27)
Glucose: 88 mg/dL (ref 70–99)
Potassium: 4.5 mmol/L (ref 3.5–5.2)
Sodium: 137 mmol/L (ref 134–144)
eGFR: 73 mL/min/{1.73_m2} (ref >59)

## 2022-02-26 ENCOUNTER — Telehealth (HOSPITAL_COMMUNITY): Payer: Self-pay | Admitting: *Deleted

## 2022-02-26 NOTE — Telephone Encounter (Signed)
Reaching out to patient to offer assistance regarding upcoming cardiac imaging study; pt verbalizes understanding of appt date/time, parking situation and where to check in, pre-test NPO status, and verified current allergies; name and call back number provided for further questions should they arise  Jeffrey Clement RN Navigator Cardiac Imaging [facility] [phone] office [phone] cell  Patient aware to arrive at 1pm.

## 2022-02-27 ENCOUNTER — Ambulatory Visit (HOSPITAL_COMMUNITY)
Admission: RE | Admit: 2022-02-27 | Discharge: 2022-02-27 | Disposition: A | Discharge status: Home / Self Care | Payer: Medicare HMO | Source: Ambulatory Visit | Attending: Cardiology | Admitting: Cardiology

## 2022-02-27 DIAGNOSIS — I480 Paroxysmal atrial fibrillation: Secondary | ICD-10-CM | POA: Diagnosis present

## 2022-02-27 MED ORDER — IOHEXOL 350 MG/ML SOLN
100.0000 mL | Freq: Once | INTRAVENOUS | Status: AC | PRN
  Administered 2022-02-27: 100 mL via INTRAVENOUS

## 2022-03-06 NOTE — Pre-Procedure Instructions (Signed)
Instructed patient on the following items: Arrival time 1230 Nothing to eat or drink after midnight No meds AM of procedure Responsible person to drive you home and stay with you for 24 hrs  Have you missed any doses of anti-coagulant Eliquis- hasn't missed any dose

## 2022-03-07 ENCOUNTER — Other Ambulatory Visit: Payer: Self-pay

## 2022-03-07 ENCOUNTER — Ambulatory Visit (HOSPITAL_BASED_OUTPATIENT_CLINIC_OR_DEPARTMENT_OTHER): Payer: Medicare HMO | Admitting: Anesthesiology

## 2022-03-07 ENCOUNTER — Ambulatory Visit (HOSPITAL_COMMUNITY): Payer: Medicare HMO | Admitting: Anesthesiology

## 2022-03-07 ENCOUNTER — Encounter (HOSPITAL_COMMUNITY)
Admission: RE | Disposition: A | Discharge status: Home / Self Care | Payer: Medicare HMO | Source: Home / Self Care | Attending: Cardiology

## 2022-03-07 ENCOUNTER — Ambulatory Visit (HOSPITAL_COMMUNITY)
Admission: RE | Admit: 2022-03-07 | Discharge: 2022-03-07 | Disposition: A | Discharge status: Home / Self Care | Payer: Medicare HMO | Attending: Cardiology | Admitting: Cardiology

## 2022-03-07 DIAGNOSIS — E119 Type 2 diabetes mellitus without complications: Secondary | ICD-10-CM | POA: Diagnosis not present

## 2022-03-07 DIAGNOSIS — I4891 Unspecified atrial fibrillation: Secondary | ICD-10-CM

## 2022-03-07 DIAGNOSIS — I10 Essential (primary) hypertension: Secondary | ICD-10-CM | POA: Insufficient documentation

## 2022-03-07 DIAGNOSIS — I480 Paroxysmal atrial fibrillation: Secondary | ICD-10-CM | POA: Insufficient documentation

## 2022-03-07 HISTORY — PX: ATRIAL FIBRILLATION ABLATION: EP1191

## 2022-03-07 LAB — POCT ACTIVATED CLOTTING TIME
Activated Clotting Time: 329 seconds
Activated Clotting Time: 329 seconds

## 2022-03-07 SURGERY — ATRIAL FIBRILLATION ABLATION
Anesthesia: General

## 2022-03-07 MED ORDER — HEPARIN (PORCINE) IN NACL 1000-0.9 UT/500ML-% IV SOLN
INTRAVENOUS | Status: AC
  Filled 2022-03-07: qty 2000

## 2022-03-07 MED ORDER — DEXAMETHASONE SODIUM PHOSPHATE 10 MG/ML IJ SOLN
INTRAMUSCULAR | Status: DC | PRN
  Administered 2022-03-07: 5 mg via INTRAVENOUS

## 2022-03-07 MED ORDER — PROTAMINE SULFATE 10 MG/ML IV SOLN
INTRAVENOUS | Status: DC | PRN
  Administered 2022-03-07: 40 mg via INTRAVENOUS

## 2022-03-07 MED ORDER — HEPARIN (PORCINE) IN NACL 1000-0.9 UT/500ML-% IV SOLN
INTRAVENOUS | Status: DC | PRN
  Administered 2022-03-07 (×4): 500 mL

## 2022-03-07 MED ORDER — HEPARIN (PORCINE) IN NACL 1000-0.9 UT/500ML-% IV SOLN
INTRAVENOUS | Status: AC
  Filled 2022-03-07: qty 500

## 2022-03-07 MED ORDER — HYDRALAZINE HCL 20 MG/ML IJ SOLN
5.0000 mg | Freq: Once | INTRAMUSCULAR | Status: AC
  Administered 2022-03-07: 5 mg via INTRAVENOUS

## 2022-03-07 MED ORDER — FENTANYL CITRATE (PF) 250 MCG/5ML IJ SOLN
INTRAMUSCULAR | Status: DC | PRN
  Administered 2022-03-07: 100 ug via INTRAVENOUS

## 2022-03-07 MED ORDER — ROCURONIUM BROMIDE 10 MG/ML (PF) SYRINGE
PREFILLED_SYRINGE | INTRAVENOUS | Status: DC | PRN
  Administered 2022-03-07: 70 mg via INTRAVENOUS

## 2022-03-07 MED ORDER — HEPARIN SODIUM (PORCINE) 1000 UNIT/ML IJ SOLN
INTRAMUSCULAR | Status: DC | PRN
  Administered 2022-03-07: 1000 [IU] via INTRAVENOUS

## 2022-03-07 MED ORDER — LIDOCAINE 2% (20 MG/ML) 5 ML SYRINGE
INTRAMUSCULAR | Status: DC | PRN
  Administered 2022-03-07: 60 mg via INTRAVENOUS

## 2022-03-07 MED ORDER — HEPARIN SODIUM (PORCINE) 1000 UNIT/ML IJ SOLN
INTRAMUSCULAR | Status: AC
  Filled 2022-03-07: qty 10

## 2022-03-07 MED ORDER — DOBUTAMINE INFUSION FOR EP/ECHO/NUC (1000 MCG/ML)
INTRAVENOUS | Status: DC | PRN
  Administered 2022-03-07: 10 ug/kg/min via INTRAVENOUS

## 2022-03-07 MED ORDER — PROPOFOL 10 MG/ML IV BOLUS
INTRAVENOUS | Status: DC | PRN
  Administered 2022-03-07: 30 mg via INTRAVENOUS

## 2022-03-07 MED ORDER — ONDANSETRON HCL 4 MG/2ML IJ SOLN
INTRAMUSCULAR | Status: DC | PRN
  Administered 2022-03-07: 4 mg via INTRAVENOUS

## 2022-03-07 MED ORDER — PANTOPRAZOLE SODIUM 40 MG PO TBEC
40.0000 mg | DELAYED_RELEASE_TABLET | Freq: Once | ORAL | Status: AC
Start: 2022-03-07 — End: 2022-03-07
  Administered 2022-03-07: 40 mg via ORAL
  Filled 2022-03-07: qty 1

## 2022-03-07 MED ORDER — HYDRALAZINE HCL 20 MG/ML IJ SOLN
INTRAMUSCULAR | Status: AC
  Filled 2022-03-07: qty 1

## 2022-03-07 MED ORDER — DOBUTAMINE INFUSION FOR EP/ECHO/NUC (1000 MCG/ML)
INTRAVENOUS | Status: AC
  Filled 2022-03-07: qty 250

## 2022-03-07 MED ORDER — SODIUM CHLORIDE 0.9% FLUSH: 3.0000 mL | INTRAVENOUS | Status: DC | PRN

## 2022-03-07 MED ORDER — ONDANSETRON HCL 4 MG/2ML IJ SOLN: 4.0000 mg | Freq: Four times a day (QID) | INTRAMUSCULAR | Status: DC | PRN

## 2022-03-07 MED ORDER — PANTOPRAZOLE SODIUM 40 MG PO TBEC: 40.0000 mg | DELAYED_RELEASE_TABLET | Freq: Every day | ORAL | 0 refills | Status: DC

## 2022-03-07 MED ORDER — PHENYLEPHRINE HCL-NACL 20-0.9 MG/250ML-% IV SOLN
INTRAVENOUS | Status: DC | PRN
  Administered 2022-03-07: 25 ug/min via INTRAVENOUS

## 2022-03-07 MED ORDER — HEPARIN SODIUM (PORCINE) 1000 UNIT/ML IJ SOLN
INTRAMUSCULAR | Status: DC | PRN
  Administered 2022-03-07: 14000 [IU] via INTRAVENOUS
  Administered 2022-03-07: 2000 [IU] via INTRAVENOUS

## 2022-03-07 MED ORDER — SODIUM CHLORIDE 0.9 % IV SOLN: 250.0000 mL | INTRAVENOUS | Status: DC | PRN

## 2022-03-07 MED ORDER — SUGAMMADEX SODIUM 200 MG/2ML IV SOLN
INTRAVENOUS | Status: DC | PRN
  Administered 2022-03-07: 160 mg via INTRAVENOUS

## 2022-03-07 MED ORDER — SODIUM CHLORIDE 0.9 % IV SOLN: INTRAVENOUS | Status: DC

## 2022-03-07 SURGICAL SUPPLY — 22 items
BAG SNAP BAND KOVER 36X36 (MISCELLANEOUS) ×1 IMPLANT
CATH 8FR REPROCESSED SOUNDSTAR (CATHETERS) ×2 IMPLANT
CATH 8FR SOUNDSTAR REPROCESSED (CATHETERS) IMPLANT
CATH ABLAT QDOT MICRO BI TC DF (CATHETERS) ×1 IMPLANT
CATH OCTARAY 2.0 F 3-3-3-3-3 (CATHETERS) ×1 IMPLANT
CATH S CIRCA THERM PROBE 10F (CATHETERS) ×1 IMPLANT
CATH WEB BI DIR CSDF CRV REPRO (CATHETERS) ×1 IMPLANT
CLOSURE PERCLOSE PROSTYLE (VASCULAR PRODUCTS) ×5 IMPLANT
COVER SWIFTLINK CONNECTOR (BAG) ×2 IMPLANT
KIT VERSACROSS STEERABLE D1 (CATHETERS) ×1 IMPLANT
PACK EP LATEX FREE (CUSTOM PROCEDURE TRAY) ×2
PACK EP LF (CUSTOM PROCEDURE TRAY) ×1 IMPLANT
PAD DEFIB RADIO PHYSIO CONN (PAD) ×2 IMPLANT
PATCH CARTO3 (PAD) ×1 IMPLANT
SHEATH CARTO VIZIGO SM CVD (SHEATH) ×1 IMPLANT
SHEATH PINNACLE 7F 10CM (SHEATH) ×1 IMPLANT
SHEATH PINNACLE 8F 10CM (SHEATH) ×2 IMPLANT
SHEATH PINNACLE 9F 10CM (SHEATH) ×1 IMPLANT
SHEATH PROBE COVER 6X72 (BAG) ×1 IMPLANT
TUBING SMART ABLATE COOLFLOW (TUBING) ×1 IMPLANT
WIRE EMERALD 3MM-J .025X260CM (WIRE) ×1 IMPLANT
WIRE EMERALD 3MM-J .035X260CM (WIRE) ×1 IMPLANT

## 2022-03-07 NOTE — Progress Notes (Signed)
Pt requests dose of new med-pantoprazole due to his pharmacy will be closed before his d/c-Dr [employer] notified-orders received

## 2022-03-07 NOTE — Anesthesia Procedure Notes (Signed)
Procedure Name: Intubation Date/Time: 03/07/2022 1:32 PM  Performed by: Kyung Rudd, CRNAPre-anesthesia Checklist: Patient identified, Emergency Drugs available, Suction available and Patient being monitored Patient Re-evaluated:Patient Re-evaluated prior to induction Oxygen Delivery Method: Circle system utilized Preoxygenation: Pre-oxygenation with 100% oxygen Induction Type: IV induction Ventilation: Mask ventilation without difficulty Laryngoscope Size: Mac and 4 Grade View: Grade I Tube type: Oral Tube size: 7.5 mm Number of attempts: 1 Airway Equipment and Method: Stylet Placement Confirmation: ETT inserted through vocal cords under direct vision, positive ETCO2 and breath sounds checked- equal and bilateral Secured at: 22 cm Tube secured with: Tape Dental Injury: Teeth and Oropharynx as per pre-operative assessment

## 2022-03-07 NOTE — Anesthesia Preprocedure Evaluation (Addendum)
Anesthesia Evaluation  Patient identified by MRN, date of birth, ID band Patient awake    Reviewed: Allergy & Precautions, NPO status , Patient's Chart, lab work & pertinent test results  Airway Mallampati: II  TM Distance: >3 FB Neck ROM: Full    Dental  (+) Teeth Intact, Dental Advisory Given   Pulmonary neg pulmonary ROS,    breath sounds clear to auscultation       Cardiovascular hypertension, Pt. on medications (-) angina(-) Past MI + dysrhythmias Atrial Fibrillation  Rhythm:Regular  1. Left ventricular ejection fraction, by visual estimation, is 50 to  55%. The left ventricle has low normal function. Left ventricular septal  wall thickness was mildly increased.  2. Left ventricular diastolic parameters are consistent with Grade I  diastolic dysfunction (impaired relaxation).  3. Global right ventricle has normal systolic function.The right  ventricular size is normal. Right vetricular wall thickness was not  assessed.  4. Left atrial size was normal.  5. Right atrial size was normal.  6. The mitral valve is normal in structure. Trivial mitral valve  regurgitation. No evidence of mitral stenosis.  7. The tricuspid valve is normal in structure. Tricuspid valve  regurgitation is trivial.  8. The aortic valve is tricuspid. Aortic valve regurgitation is trivial.  Mild aortic valve sclerosis without stenosis.  9. The pulmonic valve was normal in structure. Pulmonic valve  regurgitation is trivial.    Neuro/Psych negative neurological ROS  negative psych ROS   GI/Hepatic negative GI ROS, Neg liver ROS,   Endo/Other  diabetes  Renal/GU Renal InsufficiencyRenal disease     Musculoskeletal   Abdominal   Peds  Hematology  (+) Blood dyscrasia, , Lab Results      Component                Value               Date                      WBC                      5.5                 02/21/2022                HGB                       15.2                02/21/2022                HCT                      44.3                02/21/2022                MCV                      89                  02/21/2022                PLT                      242  02/21/2022           eliquis   Anesthesia Other Findings   Reproductive/Obstetrics                            Anesthesia Physical Anesthesia Plan  ASA: 2  Anesthesia Plan: General   Post-op Pain Management: Minimal or no pain anticipated   Induction:   PONV Risk Score and Plan: 2 and Ondansetron and Dexamethasone  Airway Management Planned: Oral ETT  Additional Equipment: None  Intra-op Plan:   Post-operative Plan: Extubation in OR  Informed Consent: I have reviewed the patients History and Physical, chart, labs and discussed the procedure including the risks, benefits and alternatives for the proposed anesthesia with the patient or authorized representative who has indicated his/her understanding and acceptance.     Dental advisory given  Plan Discussed with: CRNA  Anesthesia Plan Comments:         Anesthesia Quick Evaluation

## 2022-03-07 NOTE — Transfer of Care (Signed)
Immediate Anesthesia Transfer of Care Note  Patient: Jeffrey Contreras  Procedure(s) Performed: ATRIAL FIBRILLATION ABLATION  Patient Location: Cath Lab  Anesthesia Type:General  Level of Consciousness: awake and alert   Airway & Oxygen Therapy: Patient Spontanous Breathing and Patient connected to nasal cannula oxygen  Post-op Assessment: Report given to RN and Post -op Vital signs reviewed and stable  Post vital signs: Reviewed and stable  Last Vitals:  Vitals Value Taken Time  BP 157/79 03/07/22 1524  Temp    Pulse 70 03/07/22 1525  Resp 24 03/07/22 1525  SpO2 97 % 03/07/22 1525  Vitals shown include unvalidated device data.  Last Pain:  Vitals:   03/07/22 1517  TempSrc:   PainSc: 0-No pain         Complications: There were no known notable events for this encounter.

## 2022-03-07 NOTE — Discharge Instructions (Signed)

## 2022-03-07 NOTE — H&P (Signed)
Electrophysiology Office Note   Date:  03/07/2022   ID:  Jeffrey Contreras, DOB [birth year], MRN [id number]  PCP:  Geralynn Ochs, MD  Cardiologist:  Bettina Gavia Primary Electrophysiologist:  Janaysia Mcleroy Meredith Leeds, MD    Chief Complaint: AF   History of Present Illness: Jeffrey Contreras is a 76 y.o. male who is being seen today for the evaluation of AF at the request of No ref. provider found. Presenting today for electrophysiology evaluation.  He has a history significant for chronic lower extremity edema with venous insufficiency, paroxysmal atrial fibrillation, type 2 diabetes, hypertension.  He was having more frequent episodes of atrial fibrillation and presented to the emergency room.  He spontaneously converted to sinus rhythm.  His potassium was found to be low at the time.  Today, denies symptoms of palpitations, chest pain, shortness of breath, orthopnea, PND, lower extremity edema, claudication, dizziness, presyncope, syncope, bleeding, or neurologic sequela. The patient is tolerating medications without difficulties. Plan AF ablation today.    Past Medical History:  Diagnosis Date   Atrial fibrillation ([place]) 09/2019   Atrial fibrillation with RVR ([facility]) 09/16/2019   Benign paroxysmal positional vertigo of left ear 01/21/2018   Hallpike positive left ear down for rotational nystagmus.  Formatting of this note might be different from the original. Hallpike positive left ear down for rotational nystagmus.   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 Continues with fullness in the ears with intermittent popping.  Causes intermittent unsteadiness.  Presents to discuss ventilation tube placement. EXAMINATION reveals normal external  canals and tympanic membranes. PLAN: I am not sure if this is more an inner ear or middle ear problem.  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   Renal insufficiency 09/16/2019   Past Surgical History:  Procedure Laterality Date   GLAUCOMA SURGERY     MANDIBLE FRACTURE SURGERY     TONSILLECTOMY       Current Facility-Administered Medications  Medication Dose Route Frequency Provider Last Rate Last Admin   0.9 %  sodium chloride infusion   Intravenous Continuous Murle Otting, Ocie Doyne, MD        Allergies:   Amoxicillin, Hydrocodone-acetaminophen, and Oxycodone   Social History:  The patient  reports that he has never smoked. He has never used smokeless tobacco. He reports current alcohol use. He reports that he does not use drugs.   Family History:  The patient's family history includes Asthma in his father; Glaucoma in his father; Heart attack in his father.   ROS:  Please see the history of present illness.   Otherwise, review of systems is positive for none.   All other systems are reviewed and negative.   PHYSICAL EXAM: VS:  There were no vitals taken for this visit. , BMI There is no height or weight on file to calculate BMI. GEN: Well nourished, well developed, in no acute distress  HEENT: normal  Neck: no JVD, carotid bruits, or masses Cardiac: RRR; no murmurs, rubs, or gallops,no edema  Respiratory:  clear to auscultation bilaterally, normal work of breathing GI: soft, nontender, nondistended, + BS MS: no deformity or atrophy  Skin: warm and dry Neuro:  Strength and sensation are intact Psych: euthymic mood, full affect    Recent Labs: 11/28/2021: Magnesium 2.0; NT-Pro BNP 475 02/21/2022: BUN 20; Creatinine, Ser 1.05; Hemoglobin 15.2; Platelets 242; Potassium 4.5; Sodium 137    Lipid Panel     Component Value Date/Time   CHOL 217 (H)  09/16/2019 0431   TRIG 132 09/16/2019 0431   HDL 42 09/16/2019 0431   CHOLHDL 5.2 09/16/2019 0431   VLDL 26 09/16/2019 0431   LDLCALC 149 (H) 09/16/2019 0431     Wt Readings from Last 3 Encounters:  12/04/21 83.8 kg  11/28/21 82.7 kg  06/12/21 83.5 kg      Other studies Reviewed: Additional studies/ records that were reviewed today include: TTE 09/16/19  Review of the above records today demonstrates:   1. Left ventricular ejection fraction, by visual estimation, is 50 to  55%. The left ventricle has low normal function. Left ventricular septal  wall thickness was mildly increased.   2. Left ventricular diastolic parameters are consistent with Grade I  diastolic dysfunction (impaired relaxation).   3. Global right ventricle has normal systolic function.The right  ventricular size is normal. Right vetricular wall thickness was not  assessed.   4. Left atrial size was normal.   5. Right atrial size was normal.   6. The mitral valve is normal in structure. Trivial mitral valve  regurgitation. No evidence of mitral stenosis.   7. The tricuspid valve is normal in structure. Tricuspid valve  regurgitation is trivial.   8. The aortic valve is tricuspid. Aortic valve regurgitation is trivial.  Mild aortic valve sclerosis without stenosis.   9. The pulmonic valve was normal in structure. Pulmonic valve  regurgitation is trivial.    ASSESSMENT AND PLAN:  1.  Paroxysmal atrial fibrillation: Elma Limas has presented today for surgery, with the diagnosis of AF.  The various methods of treatment have been discussed with the patient and family. After consideration of risks, benefits and other options for treatment, the patient has consented to  Procedure(s): Catheter ablation as a surgical intervention .  Risks include but not limited to complete heart block, stroke, esophageal damage, nerve damage, bleeding, vascular damage, tamponade, perforation, MI, and death. The patient's  history has been reviewed, patient examined, no change in status, stable for surgery.  I have reviewed the patient's chart and labs.  Questions were answered to the patient's satisfaction.    Lousie Calico Curt Bears, MD 03/07/2022 12:37 PM

## 2022-03-08 ENCOUNTER — Encounter (HOSPITAL_COMMUNITY): Payer: Self-pay | Admitting: Cardiology

## 2022-03-09 NOTE — Anesthesia Postprocedure Evaluation (Signed)
Anesthesia Post Note  Patient: Jeffrey Contreras  Procedure(s) Performed: ATRIAL FIBRILLATION ABLATION     Patient location during evaluation: Cath Lab Anesthesia Type: General Level of consciousness: awake and alert Pain management: pain level controlled Vital Signs Assessment: post-procedure vital signs reviewed and stable Respiratory status: spontaneous breathing, nonlabored ventilation and respiratory function stable Cardiovascular status: blood pressure returned to baseline and stable Postop Assessment: no apparent nausea or vomiting Anesthetic complications: no   There were no known notable events for this encounter.  Last Vitals:  Vitals:   03/07/22 1801 03/07/22 1900  BP: (!) 163/83 (!) 152/79  Pulse: 70 74  Resp: 18 (!) 23  Temp:    SpO2: 98% 96%    Last Pain:  Vitals:   03/08/22 1132  TempSrc:   PainSc: 0-No pain   Pain Goal:                   [name]

## 2022-03-21 ENCOUNTER — Telehealth: Payer: Self-pay | Admitting: Cardiology

## 2022-03-21 ENCOUNTER — Encounter: Payer: Self-pay | Admitting: Cardiology

## 2022-03-21 NOTE — Telephone Encounter (Signed)
Pt c/o BP issue: STAT if pt c/o blurred vision, one-sided weakness or slurred speech  1. What are your last 5 BP readings 140/78   2. Are you having any other symptoms (ex. Dizziness, headache, blurred vision, passed out)? Dizziness  3. What is your BP issue? Spouse states that pt's BP has been up lately.  Spouse would like a callback regarding this matter due to them going out of town soon. Please advise

## 2022-03-22 NOTE — Telephone Encounter (Signed)
Baker Janus aware her message was addressed through My chart.

## 2022-04-04 ENCOUNTER — Ambulatory Visit (HOSPITAL_COMMUNITY): Payer: Medicare HMO | Admitting: Nurse Practitioner

## 2022-04-17 ENCOUNTER — Encounter (HOSPITAL_COMMUNITY): Payer: Self-pay | Admitting: Nurse Practitioner

## 2022-04-17 ENCOUNTER — Ambulatory Visit (HOSPITAL_COMMUNITY)
Admission: RE | Admit: 2022-04-17 | Discharge: 2022-04-17 | Disposition: A | Discharge status: Home / Self Care | Payer: Medicare HMO | Source: Ambulatory Visit | Attending: Nurse Practitioner | Admitting: Nurse Practitioner

## 2022-04-17 VITALS — BP 162/100 | HR 52 | Ht 69.0 in | Wt 188.4 lb

## 2022-04-17 DIAGNOSIS — Z7901 Long term (current) use of anticoagulants: Secondary | ICD-10-CM | POA: Diagnosis not present

## 2022-04-17 DIAGNOSIS — I4891 Unspecified atrial fibrillation: Secondary | ICD-10-CM | POA: Diagnosis not present

## 2022-04-17 DIAGNOSIS — D6869 Other thrombophilia: Secondary | ICD-10-CM

## 2022-04-17 DIAGNOSIS — I10 Essential (primary) hypertension: Secondary | ICD-10-CM | POA: Insufficient documentation

## 2022-04-17 DIAGNOSIS — I480 Paroxysmal atrial fibrillation: Secondary | ICD-10-CM

## 2022-04-17 NOTE — Progress Notes (Signed)
Primary Care Physician: Geralynn Ochs, MD Referring Physician:Dr. Sheliah Mends [name] is a 76 y.o. male with a h/o afib s/p ablation one month ago. He is doing great s/p ablation. Denies  any afib, swallowing or groin issues. EKG shows SR.   Today, he denies symptoms of palpitations, chest pain, shortness of breath, orthopnea, PND, lower extremity edema, dizziness, presyncope, syncope, or neurologic sequela. The patient is tolerating medications without difficulties and is otherwise without complaint today.   Past Medical History:  Diagnosis Date   Atrial fibrillation ([place]) 09/2019   Atrial fibrillation with RVR ([facility]) 09/16/2019   Benign paroxysmal positional vertigo of left ear 01/21/2018   Hallpike positive left ear down for rotational nystagmus.  Formatting of this note might be different from the original. Hallpike positive left ear down for rotational nystagmus.   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 Continues with fullness in the ears with intermittent popping.  Causes intermittent unsteadiness.  Presents to discuss ventilation tube placement. EXAMINATION reveals normal external canals and tympanic membranes. PLAN: I am not sure if this is more an inner ear or middle ear problem.  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 180 tablet 1   atorvastatin (LIPITOR) 40 MG tablet TAKE 1 TABLET BY MOUTH ONCE DAILY AT  6  PM 90 tablet 3   benzonatate (TESSALON PERLES) 100 MG capsule Take 1 capsule (100 mg total) by mouth as needed for cough. 20 capsule 2   brimonidine-timolol (COMBIGAN) 0.2-0.5 % ophthalmic solution Place 1 drop into both eyes 2 (two) times daily.     diltiazem (CARDIZEM) 30 MG tablet TAKE 1 TABLET BY MOUTH EVERY 4 HOURS AS NEEDED FOR  HEART  RATE  GREATER  THAN  120  OR  IN  A-FIB 60 tablet 0   dorzolamide (TRUSOPT) 2 % ophthalmic solution Place 1 drop into the left eye 3 (three) times daily.     eplerenone (INSPRA) 25 MG tablet Take 25 mg by mouth daily.     fluticasone (FLONASE) 50 MCG/ACT nasal spray Place 1 spray into both nostrils at bedtime.     loratadine (CLARITIN) 10 MG tablet Take 10 mg by mouth daily.     Misc Natural Products (PUMPKIN SEED OIL) CAPS Take 1 capsule by mouth daily. 1,000 mg per cap     telmisartan (MICARDIS) 40 MG tablet TAKE 1 TABLET BY MOUTH ONCE DAILY AT  10  PM 90 tablet 3   No current facility-administered medications for this encounter.    Allergies  Allergen Reactions   Amoxicillin  Rash   Hydrocodone-Acetaminophen Rash and Other (See Comments)    Pt states when taking this med, he feels like he is floating.     Oxycodone Nausea Only    Social History   Socioeconomic History   Marital status: Married    Spouse name: Not on file   Number of children: Not on file   Years of education: Not on file   Highest education level: Not on file  Occupational History   Not on file  Tobacco Use   Smoking status: Never   Smokeless tobacco: Never  Vaping Use   Vaping Use: Never used  Substance and Sexual Activity    Alcohol use: Yes    Comment: occ   Drug use: Never   Sexual activity: Yes  Other Topics Concern   Not on file  Social History Narrative   Not on file   Social Determinants of Health   Financial Resource Strain: Not on file  Food Insecurity: Not on file  Transportation Needs: Not on file  Physical Activity: Not on file  Stress: Not on file  Social Connections: Not on file  Intimate Partner Violence: Not on file    Family History  Problem Relation Age of Onset   Asthma Father    Heart attack Father    Glaucoma Father     ROS- All systems are reviewed and negative except as per the HPI above  Physical Exam: Vitals:   04/17/22 1355  BP: (!) 162/100  Pulse: (!) 52  Weight: 85.5 kg  Height: '5\' 9"'$  (1.753 m)   Wt Readings from Last 3 Encounters:  04/17/22 85.5 kg  03/07/22 81.2 kg  12/04/21 83.8 kg    Labs: Lab Results  Component Value Date   NA 137 02/21/2022   K 4.5 02/21/2022   CL 104 02/21/2022   CO2 22 02/21/2022   GLUCOSE 88 02/21/2022   BUN 20 02/21/2022   CREATININE 1.05 02/21/2022   CALCIUM 9.1 02/21/2022   MG 2.0 11/28/2021   No results found for: "INR" Lab Results  Component Value Date   CHOL 217 (H) 09/16/2019   HDL 42 09/16/2019   LDLCALC 149 (H) 09/16/2019   TRIG 132 09/16/2019     GEN- The patient is well appearing, alert and oriented x 3 today.   Head- normocephalic, atraumatic Eyes-  Sclera clear, conjunctiva pink Ears- hearing intact Oropharynx- clear Neck- supple, no JVP Lymph- no cervical lymphadenopathy Lungs- Clear to ausculation bilaterally, normal work of breathing Heart- Regular rate and rhythm, no murmurs, rubs or gallops, PMI not laterally displaced GI- soft, NT, ND, + BS Extremities- no clubbing, cyanosis, or edema MS- no significant deformity or atrophy Skin- no rash or lesion Psych- euthymic mood, full affect Neuro- strength and sensation are intact  EKG-Vent. rate 52 BPM PR interval 158 ms QRS duration 76  ms QT/QTcB 448/416 ms P-R-T axes -7 2 33 Sinus bradycardia Minimal voltage criteria for LVH, may be normal variant ( R in aVL ) Borderline ECG When compared with ECG of 07-Mar-2022 15:37, PREVIOUS ECG IS PRESENT    Assessment and Plan:  1. Afib S/p ablation x one month Is doing well staying in SR  2. CHA2DS2VASc  score of 3 Continue eliquis 5 mg bid  without any interruptions  3. HTN Elevated here, but reviewed BP's from  home and are well controlled   F/u with Dr. Curt Bears 10/23  Geroge Baseman. Jeffrey Contreras, [facility] [address]  [place], [place] [address] [phone]

## 2022-05-22 ENCOUNTER — Other Ambulatory Visit: Payer: Self-pay | Admitting: Cardiology

## 2022-05-22 NOTE — Telephone Encounter (Signed)
Prescription refill request for Eliquis received. Indication: PAF Last office visit: 04/17/22  Maximino Greenland NP Scr: 1.05 on 02/21/22 Age: 76 Weight: 85.5kg  Based on above findings Eliquis '5mg'$  twice daily is the appropriate dose.  Refill approved.

## 2022-05-30 ENCOUNTER — Encounter: Payer: Self-pay | Admitting: Cardiology

## 2022-05-30 ENCOUNTER — Ambulatory Visit: Payer: Medicare HMO | Attending: Cardiology | Admitting: Cardiology

## 2022-05-30 VITALS — BP 164/88 | HR 56 | Ht 69.0 in | Wt 186.2 lb

## 2022-05-30 DIAGNOSIS — D6869 Other thrombophilia: Secondary | ICD-10-CM

## 2022-05-30 DIAGNOSIS — I480 Paroxysmal atrial fibrillation: Secondary | ICD-10-CM

## 2022-05-30 DIAGNOSIS — R001 Bradycardia, unspecified: Secondary | ICD-10-CM

## 2022-05-30 DIAGNOSIS — I129 Hypertensive chronic kidney disease with stage 1 through stage 4 chronic kidney disease, or unspecified chronic kidney disease: Secondary | ICD-10-CM | POA: Diagnosis not present

## 2022-05-30 DIAGNOSIS — C44211 Basal cell carcinoma of skin of unspecified ear and external auricular canal: Secondary | ICD-10-CM | POA: Insufficient documentation

## 2022-05-30 MED ORDER — HYDROCHLOROTHIAZIDE 12.5 MG PO CAPS: 12.5000 mg | ORAL_CAPSULE | Freq: Every day | ORAL | 3 refills | Status: DC

## 2022-05-30 NOTE — Patient Instructions (Signed)
Medication Instructions:  Your physician has recommended you make the following change in your medication:   Take Hydrochlorothiazide 12.5 mg daily. Keep a BP log for 2 weeks 1-2 hours after your medication and send to Dr. Bettina Gavia.   *If you need a refill on your cardiac medications before your next appointment, please call your pharmacy*   Lab Work: None ordered If you have labs (blood work) drawn today and your tests are completely normal, you will receive your results only by: [place] (if you have MyChart) OR A paper copy in the mail If you have any lab test that is abnormal or we need to change your treatment, we will call you to review the results.   Testing/Procedures: None ordered   Follow-Up: At [facility], you and your health needs are our priority.  As part of our continuing mission to provide you with exceptional heart care, we have created designated Provider Care Teams.  These Care Teams include your primary Cardiologist (physician) and Advanced Practice Providers (APPs -  Physician Assistants and Nurse Practitioners) who all work together to provide you with the care you need, when you need it.  We recommend signing up for the patient portal called "MyChart".  Sign up information is provided on this After Visit Summary.  MyChart is used to connect with patients for Virtual Visits (Telemedicine).  Patients are able to view lab/test results, encounter notes, upcoming appointments, etc.  Non-urgent messages can be sent to your provider as well.   To learn more about what you can do with MyChart, go to [email or web address].    Your next appointment:   As needed  The format for your next appointment:   In Person  Provider:   Shirlee More, MD   Other Instructions NA

## 2022-05-30 NOTE — Progress Notes (Signed)
Cardiology Office Note:    Date:  05/30/2022   ID:  Jeffrey Contreras, DOB 03/20/1946, MRN [id number]  PCP:  Geralynn Ochs, MD  Cardiologist:  Shirlee More, MD    Referring MD: Geralynn Ochs, MD    ASSESSMENT:    1. Paroxysmal atrial fibrillation (HCC)   2. Secondary hypercoagulable state ([place])   3. Sinus bradycardia   4. Benign hypertension with chronic kidney disease    PLAN:    In order of problems listed above:  He continues to do well after EP catheter ablation presently not on antiarrhythmic drug or rate slowing medication with sinus bradycardia he will continue his anticoagulation is hopeful that he will be able to withdraw after being seen in follow-up by EP Continue Eliquis StAble heart rate off suppressant treatment BP is not at target resume thiazide diuretic reassess home blood pressures 2 weeks   Next appointment: I will plan to see back in the office as needed he will follow-up with EP   Medication Adjustments/Labs and Tests Ordered: Current medicines are reviewed at length with the patient today.  Concerns regarding medicines are outlined above.  No orders of the defined types were placed in this encounter.  Meds ordered this encounter  Medications   hydrochlorothiazide (MICROZIDE) 12.5 MG capsule    Sig: Take 1 capsule (12.5 mg total) by mouth daily.    Dispense:  90 capsule    Refill:  3   Chief complaint follow-up atrial fibrillation after ablation   History of Present Illness:    Jeffrey Contreras is a 76 y.o. male with a hx of chronic lower extremity edema with venous insufficiency paroxysmal atrial fibrillation chronic anticoagulation type 2 diabetes mellitus hypertensive heart disease  last seen 11/28/2021. He had EP catheter ablation of AF 03/07/2022.  Compliance with diet, lifestyle and medications: Yes  He continues to do well has had no recurrent atrial fibrillation is approaching 3 months from his ablation.  He inquires whether  he can come off anticoagulation.  He is using a self watch and is monitoring himself. His home blood pressures are running greater than 527 systolic previously on a thiazide diuretic we will resume and drop off a list in 2 weeks.  Blood pressure to my check is 170/80.  He has had no edema shortness of breath chest pain or syncope He has had no bleeding from his anticoagulant Past Medical History:  Diagnosis Date   Atrial fibrillation ([place]) 09/2019   Atrial fibrillation with RVR ([place]) 09/16/2019   Benign paroxysmal positional vertigo of left ear 01/21/2018   Hallpike positive left ear down for rotational nystagmus.  Formatting of this note might be different from the original. Hallpike positive left ear down for rotational nystagmus.   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 Continues with fullness in the ears with intermittent popping.  Causes intermittent unsteadiness.  Presents to discuss ventilation tube placement. EXAMINATION reveals normal external canals and tympanic membranes. PLAN: I am not sure if this is more an inner ear or middle ear problem.  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   Renal insufficiency 09/16/2019  Past Surgical History:  Procedure Laterality Date   ATRIAL FIBRILLATION ABLATION N/A 03/07/2022   Procedure: ATRIAL FIBRILLATION ABLATION;  Surgeon: Constance Haw, MD;  Location: [facility];  Service: Cardiovascular;  Laterality: N/A;   GLAUCOMA SURGERY     MANDIBLE FRACTURE SURGERY     TONSILLECTOMY      Current Medications: Current Meds  Medication Sig   apixaban  (ELIQUIS) 5 MG TABS tablet Take 1 tablet by mouth twice daily   atorvastatin (LIPITOR) 40 MG tablet TAKE 1 TABLET BY MOUTH ONCE DAILY AT  6  PM (Patient taking differently: Take 40 mg by mouth daily. TAKE 1 TABLET BY MOUTH ONCE DAILY AT  6  PM)   benzonatate (TESSALON PERLES) 100 MG capsule Take 1 capsule (100 mg total) by mouth as needed for cough.   brimonidine-timolol (COMBIGAN) 0.2-0.5 % ophthalmic solution Place 1 drop into both eyes 2 (two) times daily.   diltiazem (CARDIZEM) 30 MG tablet TAKE 1 TABLET BY MOUTH EVERY 4 HOURS AS NEEDED FOR  HEART  RATE  GREATER  THAN  120  OR  IN  A-FIB (Patient taking differently: 30 mg every 4 (four) hours as needed (HR >120 or in Afib).)   dorzolamide (TRUSOPT) 2 % ophthalmic solution Place 1 drop into the left eye 3 (three) times daily.   eplerenone (INSPRA) 25 MG tablet Take 25 mg by mouth daily.   fluticasone (FLONASE) 50 MCG/ACT nasal spray Place 1 spray into both nostrils at bedtime.   hydrochlorothiazide (MICROZIDE) 12.5 MG capsule Take 1 capsule (12.5 mg total) by mouth daily.   loratadine (CLARITIN) 10 MG tablet Take 10 mg by mouth daily.   Misc Natural Products (PUMPKIN SEED OIL) CAPS Take 1 capsule by mouth daily. 1,000 mg per cap   telmisartan (MICARDIS) 40 MG tablet TAKE 1 TABLET BY MOUTH ONCE DAILY AT  10  PM (Patient taking differently: Take 40 mg by mouth daily. TAKE 1 TABLET BY MOUTH ONCE DAILY AT  10  PM)     Allergies:   Amoxicillin, Hydrocodone-acetaminophen, and Oxycodone   Social History   Socioeconomic History   Marital status: Married    Spouse name: Not on file   Number of children: Not on file   Years of education: Not on file   Highest education level: Not on file  Occupational History   Not on file  Tobacco Use   Smoking status: Never   Smokeless tobacco: Never  Vaping Use   Vaping Use: Never used  Substance and Sexual Activity   Alcohol use: Yes    Comment: occ   Drug use: Never   Sexual activity: Yes  Other  Topics Concern   Not on file  Social History Narrative   Not on file   Social Determinants of Health   Financial Resource Strain: Not on file  Food Insecurity: Not on file  Transportation Needs: Not on file  Physical Activity: Not on file  Stress: Not on file  Social Connections: Not on file     Family History: The patient's family history includes Asthma in his father; Glaucoma in his father; Heart attack in his father. ROS:   Please see the history of present illness.    All other systems reviewed and are negative.  EKGs/Labs/Other Studies Reviewed:    The following studies were reviewed today:  EKG EKG 04/17/2022 showed sinus rhythm and normal  Recent Labs: 11/28/2021: Magnesium 2.0; NT-Pro BNP 475 02/21/2022: BUN 20; Creatinine, Ser 1.05;  Hemoglobin 15.2; Platelets 242; Potassium 4.5; Sodium 137  Recent Lipid Panel    Component Value Date/Time   CHOL 217 (H) 09/16/2019 0431   TRIG 132 09/16/2019 0431   HDL 42 09/16/2019 0431   CHOLHDL 5.2 09/16/2019 0431   VLDL 26 09/16/2019 0431   LDLCALC 149 (H) 09/16/2019 0431    Physical Exam:    VS:  BP (!) 164/88 (BP Location: Right Arm, Patient Position: Sitting)   Pulse (!) 56   Ht '5\' 9"'$  (1.753 m)   Wt 186 lb 3.2 oz (84.5 kg)   SpO2 98%   BMI 27.50 kg/m     Wt Readings from Last 3 Encounters:  05/30/22 186 lb 3.2 oz (84.5 kg)  04/17/22 188 lb 6.4 oz (85.5 kg)  03/07/22 179 lb (81.2 kg)     GEN:  Well nourished, well developed in no acute distress HEENT: Normal NECK: No JVD; No carotid bruits LYMPHATICS: No lymphadenopathy CARDIAC: RRR, no murmurs, rubs, gallops RESPIRATORY:  Clear to auscultation without rales, wheezing or rhonchi  ABDOMEN: Soft, non-tender, non-distended MUSCULOSKELETAL:  No edema; No deformity  SKIN: Warm and dry NEUROLOGIC:  Alert and oriented x 3 PSYCHIATRIC:  Normal affect    Signed, Shirlee More, MD  05/30/2022 2:24 PM    [place]

## 2022-06-04 ENCOUNTER — Encounter: Payer: Self-pay | Admitting: Cardiology

## 2022-06-04 ENCOUNTER — Ambulatory Visit: Payer: Medicare HMO | Attending: Cardiology | Admitting: Cardiology

## 2022-06-04 VITALS — BP 126/78 | HR 53 | Ht 69.0 in | Wt 184.2 lb

## 2022-06-04 DIAGNOSIS — D6869 Other thrombophilia: Secondary | ICD-10-CM | POA: Diagnosis not present

## 2022-06-04 DIAGNOSIS — I10 Essential (primary) hypertension: Secondary | ICD-10-CM | POA: Diagnosis not present

## 2022-06-04 DIAGNOSIS — I480 Paroxysmal atrial fibrillation: Secondary | ICD-10-CM

## 2022-06-04 MED ORDER — TELMISARTAN 40 MG PO TABS: 40.0000 mg | ORAL_TABLET | Freq: Every day | ORAL | 2 refills | Status: DC

## 2022-06-04 MED ORDER — EPLERENONE 25 MG PO TABS: 25.0000 mg | ORAL_TABLET | Freq: Every day | ORAL | 2 refills | Status: DC

## 2022-06-04 MED ORDER — ATORVASTATIN CALCIUM 40 MG PO TABS: 40.0000 mg | ORAL_TABLET | Freq: Every day | ORAL | 2 refills | Status: DC

## 2022-06-04 NOTE — Patient Instructions (Signed)
Medication Instructions:  Your physician recommends that you continue on your current medications as directed. Please refer to the Current Medication list given to you today.  *If you need a refill on your cardiac medications before your next appointment, please call your pharmacy*   Lab Work: None ordered   Testing/Procedures: None ordered   Follow-Up: At [facility], you and your health needs are our priority.  As part of our continuing mission to provide you with exceptional heart care, we have created designated Provider Care Teams.  These Care Teams include your primary Cardiologist (physician) and Advanced Practice Providers (APPs -  Physician Assistants and Nurse Practitioners) who all work together to provide you with the care you need, when you need it.  Your next appointment:   6 month(s)  The format for your next appointment:   In Person  Provider:   Will Camnitz, MD    Thank you for choosing [facility]!!   Aashish Hamm, RN [phone]  Other Instructions   Important Information About Sugar           

## 2022-06-04 NOTE — Progress Notes (Signed)
Electrophysiology Office Note   Date:  06/04/2022   ID:  Jeffrey Contreras, DOB May 19, 1946, MRN [id number]  PCP:  Geralynn Ochs, MD  Cardiologist:  Bettina Gavia Primary Electrophysiologist:  Jorian [place]hoite Meredith Leeds, MD    Chief Complaint: AF   History of Present Illness: Jeffrey Contreras is a 76 y.o. male who is being seen today for the evaluation of AF at the request of Geralynn Ochs, MD. Presenting today for electrophysiology evaluation.  He has a history significant for chronic lower extremity edema with venous insufficiency, paroxysmal atrial fibrillation, type 2 diabetes, hypertension.  He was having more frequent episodes of atrial fibrillation.  He is now status post ablation 03/07/2022.  Today, denies symptoms of palpitations, chest pain, shortness of breath, orthopnea, PND, lower extremity edema, claudication, dizziness, presyncope, syncope, bleeding, or neurologic sequela. The patient is tolerating medications without difficulties.  Since being seen he has done well.  He is noted no further episodes of atrial fibrillation since his ablation.  He is overall happy with his control.  Brings in blood pressure measurements that are consistently in the 120s to 130s.  He has no complaints today.   Past Medical History:  Diagnosis Date   Atrial fibrillation ([place]) 09/2019   Atrial fibrillation with RVR (HCC) 09/16/2019   Benign paroxysmal positional vertigo of left ear 01/21/2018   Hallpike positive left ear down for rotational nystagmus.  Formatting of this note might be different from the original. Hallpike positive left ear down for rotational nystagmus.   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 Continues with fullness in the  ears with intermittent popping.  Causes intermittent unsteadiness.  Presents to discuss ventilation tube placement. EXAMINATION reveals normal external canals and tympanic membranes. PLAN: I am not sure if this is more an inner ear or middle ear problem.  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   Renal insufficiency 09/16/2019   Past Surgical History:  Procedure Laterality Date   ATRIAL FIBRILLATION ABLATION N/A 03/07/2022   Procedure: ATRIAL FIBRILLATION ABLATION;  Surgeon: Constance Haw, MD;  Location: [facility];  Service: Cardiovascular;  Laterality: N/A;   GLAUCOMA SURGERY     MANDIBLE FRACTURE SURGERY     TONSILLECTOMY       Current Outpatient Medications  Medication Sig Dispense Refill   apixaban (ELIQUIS) 5 MG TABS tablet Take 1 tablet by mouth twice daily 180 tablet 1   atorvastatin (LIPITOR) 40 MG tablet TAKE 1 TABLET BY MOUTH ONCE DAILY AT  6  PM (Patient taking differently: Take 40 mg by mouth daily. TAKE 1 TABLET BY MOUTH ONCE DAILY AT  6  PM) 90 tablet 3   benzonatate (TESSALON PERLES) 100 MG capsule Take 1 capsule (100 mg total) by mouth as needed for cough. 20 capsule 2   brimonidine-timolol (COMBIGAN) 0.2-0.5 % ophthalmic solution Place 1 drop into both eyes 2 (two) times daily.     diltiazem (CARDIZEM) 30 MG tablet TAKE 1 TABLET BY MOUTH EVERY 4 HOURS AS NEEDED FOR  HEART  RATE  GREATER  THAN  120  OR  IN  A-FIB (Patient taking differently:  30 mg every 4 (four) hours as needed (HR >120 or in Afib).) 60 tablet 0   dorzolamide (TRUSOPT) 2 % ophthalmic solution Place 1 drop into the left eye 3 (three) times daily.     eplerenone (INSPRA) 25 MG tablet Take 25 mg by mouth daily.     fluticasone (FLONASE) 50 MCG/ACT nasal spray Place 1 spray into both nostrils at bedtime.     hydrochlorothiazide (MICROZIDE) 12.5 MG capsule Take 1 capsule  (12.5 mg total) by mouth daily. 90 capsule 3   loratadine (CLARITIN) 10 MG tablet Take 10 mg by mouth daily.     Misc Natural Products (PUMPKIN SEED OIL) CAPS Take 2 capsules by mouth daily. 1,000 mg per cap     telmisartan (MICARDIS) 40 MG tablet TAKE 1 TABLET BY MOUTH ONCE DAILY AT  10  PM (Patient taking differently: Take 40 mg by mouth daily. TAKE 1 TABLET BY MOUTH ONCE DAILY AT  10  PM) 90 tablet 3   No current facility-administered medications for this visit.    Allergies:   Amoxicillin, Hydrocodone-acetaminophen, and Oxycodone   Social History:  The patient  reports that he has never smoked. He has never used smokeless tobacco. He reports current alcohol use. He reports that he does not use drugs.   Family History:  The patient's family history includes Asthma in his father; Glaucoma in his father; Heart attack in his father.   ROS:  Please see the history of present illness.   Otherwise, review of systems is positive for none.   All other systems are reviewed and negative.   PHYSICAL EXAM: VS:  BP 126/78   Pulse (!) 53   Ht '5\' 9"'$  (1.753 m)   Wt 184 lb 3.2 oz (83.6 kg)   SpO2 99%   BMI 27.20 kg/m  , BMI Body mass index is 27.2 kg/m. GEN: Well nourished, well developed, in no acute distress  HEENT: normal  Neck: no JVD, carotid bruits, or masses Cardiac: RRR; no murmurs, rubs, or gallops,no edema  Respiratory:  clear to auscultation bilaterally, normal work of breathing GI: soft, nontender, nondistended, + BS MS: no deformity or atrophy  Skin: warm and dry Neuro:  Strength and sensation are intact Psych: euthymic mood, full affect  EKG:  EKG is ordered today. Personal review of the ekg ordered shows sinus rhythm, rate 53  Recent Labs: 11/28/2021: Magnesium 2.0; NT-Pro BNP 475 02/21/2022: BUN 20; Creatinine, Ser 1.05; Hemoglobin 15.2; Platelets 242; Potassium 4.5; Sodium 137    Lipid Panel     Component Value Date/Time   CHOL 217 (H) 09/16/2019 0431   TRIG 132  09/16/2019 0431   HDL 42 09/16/2019 0431   CHOLHDL 5.2 09/16/2019 0431   VLDL 26 09/16/2019 0431   LDLCALC 149 (H) 09/16/2019 0431     Wt Readings from Last 3 Encounters:  06/04/22 184 lb 3.2 oz (83.6 kg)  05/30/22 186 lb 3.2 oz (84.5 kg)  04/17/22 188 lb 6.4 oz (85.5 kg)      Other studies Reviewed: Additional studies/ records that were reviewed today include: TTE 09/16/19  Review of the above records today demonstrates:   1. Left ventricular ejection fraction, by visual estimation, is 50 to  55%. The left ventricle has low normal function. Left ventricular septal  wall thickness was mildly increased.   2. Left ventricular diastolic parameters are consistent with Grade I  diastolic dysfunction (impaired relaxation).   3. Global right ventricle has normal systolic function.The right  ventricular size is normal. Right vetricular wall thickness was not  assessed.   4. Left atrial size was normal.   5. Right atrial size was normal.   6. The mitral valve is normal in structure. Trivial mitral valve  regurgitation. No evidence of mitral stenosis.   7. The tricuspid valve is normal in structure. Tricuspid valve  regurgitation is trivial.   8. The aortic valve is tricuspid. Aortic valve regurgitation is trivial.  Mild aortic valve sclerosis without stenosis.   9. The pulmonic valve was normal in structure. Pulmonic valve  regurgitation is trivial.    ASSESSMENT AND PLAN:  1.  Paroxysmal atrial fibrillation: Currently on Eliquis 5 mg twice daily.  CHA2DS2-VASc of 4.  Is now status post ablation 03/07/2022.  Is in sinus rhythm.  He has not had any further episodes of atrial fibrillation.  We Sukhraj Esquivias continue with current management.  2.  Hypertension: Currently well controlled  3.  Secondary hypercoagulable state: Currently on Eliquis for atrial fibrillation as above.   Current medicines are reviewed at length with the patient today.   The patient does not have concerns regarding his  medicines.  The following changes were made today: None  Labs/ tests ordered today include:  No orders of the defined types were placed in this encounter.    Disposition:   FU with Raha Tennison 6 months  Signed, Denasia Venn Meredith Leeds, MD  06/04/2022 12:13 PM     [address] [place] [address] [phone] (office) [phone] (fax)

## 2022-06-06 NOTE — Addendum Note (Signed)
Addended by: Michelle Nasuti on: 06/06/2022 07:18 AM   Modules accepted: Orders

## 2022-06-16 ENCOUNTER — Encounter: Payer: Self-pay | Admitting: Cardiology

## 2022-06-18 NOTE — Telephone Encounter (Signed)
Upon your return.

## 2022-07-27 ENCOUNTER — Telehealth: Payer: Self-pay | Admitting: Cardiology

## 2022-07-27 NOTE — Telephone Encounter (Signed)
Called patient's wife and she stated that the patient needs to take coricidin hbp. Is this safe to take if the patient is also taking Eliquis? Please advise.

## 2022-07-27 NOTE — Telephone Encounter (Signed)
  Pt c/o medication issue:  1. Name of Medication: coricidin hbp  2. How are you currently taking this medication (dosage and times per day)?   3. Are you having a reaction (difficulty breathing--STAT)? No   4. What is your medication issue? Pt's wife said, pt needs to take this but on the box it says to call doctor if pt is taking warfarin, pt is taking eliquis and they want to ask Dr. Bettina Gavia f its ok to take this medication

## 2022-08-14 NOTE — Telephone Encounter (Signed)
Left message for the patient to call back.

## 2022-08-15 NOTE — Telephone Encounter (Signed)
Called patient and he was able to reach out to his PCP in [place] regarding taking the coricidin. Patient was appreciative for the call and had no further questions at this time.

## 2022-09-03 ENCOUNTER — Other Ambulatory Visit: Payer: Self-pay | Admitting: Cardiology

## 2022-09-20 ENCOUNTER — Encounter (HOSPITAL_COMMUNITY): Payer: Self-pay | Admitting: *Deleted

## 2022-10-13 ENCOUNTER — Encounter: Payer: Self-pay | Admitting: Cardiology

## 2022-11-19 ENCOUNTER — Other Ambulatory Visit: Payer: Self-pay | Admitting: Cardiology

## 2022-11-19 NOTE — Telephone Encounter (Signed)
Prescription refill request for Eliquis received. Indication: afib  Last office visit: Camnitz, 06/04/2022 Scr: 1.03, 06/25/2022 Age: 77 yo  Weight: 83.6 kg   Refill sent.

## 2022-12-07 ENCOUNTER — Ambulatory Visit: Payer: Medicare HMO | Attending: Cardiology | Admitting: Cardiology

## 2022-12-07 ENCOUNTER — Encounter: Payer: Self-pay | Admitting: Cardiology

## 2022-12-07 VITALS — BP 118/74 | HR 51 | Ht 69.0 in | Wt 190.0 lb

## 2022-12-07 DIAGNOSIS — I480 Paroxysmal atrial fibrillation: Secondary | ICD-10-CM | POA: Diagnosis not present

## 2022-12-07 DIAGNOSIS — D6869 Other thrombophilia: Secondary | ICD-10-CM | POA: Diagnosis not present

## 2022-12-07 DIAGNOSIS — Z79899 Other long term (current) drug therapy: Secondary | ICD-10-CM | POA: Diagnosis not present

## 2022-12-07 NOTE — Patient Instructions (Signed)
Medication Instructions:  Your physician recommends that you continue on your current medications as directed. Please refer to the Current Medication list given to you today.  *If you need a refill on your cardiac medications before your next appointment, please call your pharmacy*   Lab Work: Today, Eliquis surveillance labs: BMET & CBC  If you have labs (blood work) drawn today and your tests are completely normal, you will receive your results only by: MyChart Message (if you have MyChart) OR A paper copy in the mail If you have any lab test that is abnormal or we need to change your treatment, we will call you to review the results.   Testing/Procedures: None ordered   Follow-Up: At [facility], you and your health needs are our priority.  As part of our continuing mission to provide you with exceptional heart care, we have created designated Provider Care Teams.  These Care Teams include your primary Cardiologist (physician) and Advanced Practice Providers (APPs -  Physician Assistants and Nurse Practitioners) who all work together to provide you with the care you need, when you need it.  Your next appointment:   6 month(s)  The format for your next appointment:   In Person  Provider:   You will see one of the following Advanced Practice Providers on your designated Care Team:   Francis Dowse, [place] Casimiro Needle "Mardelle Matte" Lanna Poche, [place]  Thank you for choosing [facility]!!   Dory Horn, RN [phone]

## 2022-12-07 NOTE — Progress Notes (Signed)
Electrophysiology Office Note   Date:  12/07/2022   ID:  Jeffrey Contreras, DOB [birth year], MRN [id number]  PCP:  Jeffrey Daughters, MD  Cardiologist:  Jeffrey Contreras Primary Electrophysiologist:  Jeffrey Norlander Jorja Loa, MD    Chief Complaint: AF   History of Present Illness: Jeffrey Contreras is a 77 y.o. male who is being seen today for the evaluation of AF at the request of Jeffrey Daughters, MD. Presenting today for electrophysiology evaluation.  He has a history significant for chronic lower extremity edema, venous insufficiency, atrial fibrillation, diabetes, hypertension.  He was having more frequent episodes of atrial fibrillation.  He is post ablation 03/07/2022.  Today, denies symptoms of palpitations, chest pain, shortness of breath, orthopnea, PND, lower extremity edema, claudication, dizziness, presyncope, syncope, bleeding, or neurologic sequela. The patient is tolerating medications without difficulties.  Since being seen he has done well.  He has had no chest pain or shortness of breath.  He has had 1 episode of atrial fibrillation that occurred when he woke from sleep.  He went back to bed after 45 minutes he was back in normal rhythm.  He is happy with his control.   Past Medical History:  Diagnosis Date   Atrial fibrillation (HCC) 09/2019   Atrial fibrillation with RVR (HCC) 09/16/2019   Benign paroxysmal positional vertigo of left ear 01/21/2018   Hallpike positive left ear down for rotational nystagmus.  Formatting of this note might be different from the original. Hallpike positive left ear down for rotational nystagmus.   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 Continues with fullness in the ears with intermittent  popping.  Causes intermittent unsteadiness.  Presents to discuss ventilation tube placement. EXAMINATION reveals normal external canals and tympanic membranes. PLAN: I am not sure if this is more an inner ear or middle ear problem.  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   Renal insufficiency 09/16/2019   Past Surgical History:  Procedure Laterality Date   ATRIAL FIBRILLATION ABLATION N/A 03/07/2022   Procedure: ATRIAL FIBRILLATION ABLATION;  Surgeon: Regan Lemming, MD;  Location: [facility];  Service: Cardiovascular;  Laterality: N/A;   GLAUCOMA SURGERY     MANDIBLE FRACTURE SURGERY     TONSILLECTOMY       Current Outpatient Medications  Medication Sig Dispense Refill   apixaban (ELIQUIS) 5 MG TABS tablet Take 1 tablet by mouth twice daily 180 tablet 1   atorvastatin (LIPITOR) 40 MG tablet Take 1 tablet (40 mg total) by mouth daily. TAKE 1 TABLET BY MOUTH ONCE DAILY AT  6  PM 90 tablet 2   benzonatate (TESSALON PERLES) 100 MG capsule Take 1 capsule (100 mg total) by mouth as needed for cough. 20 capsule 2   brimonidine-timolol (COMBIGAN) 0.2-0.5 % ophthalmic solution Place 1 drop into both eyes 2 (two) times daily.     CHIA SEED PO Take by mouth.     diltiazem (CARDIZEM) 30 MG tablet Take 1 tablet (30 mg total) by mouth every 4 (four) hours as needed (heart rate greater than 120 bpm or in afib). 60 tablet 2   dorzolamide (TRUSOPT)  2 % ophthalmic solution Place 1 drop into the left eye 3 (three) times daily.     eplerenone (INSPRA) 25 MG tablet Take 1 tablet (25 mg total) by mouth daily. 90 tablet 2   fluticasone (FLONASE) 50 MCG/ACT nasal spray Place 1 spray into both nostrils at bedtime.     loratadine (CLARITIN) 10 MG tablet Take 10 mg by mouth daily.     Misc Natural Products (PUMPKIN SEED OIL) CAPS Take 2 capsules by mouth daily. 1,000 mg  per cap     telmisartan (MICARDIS) 40 MG tablet Take 1 tablet (40 mg total) by mouth daily. TAKE 1 TABLET BY MOUTH ONCE DAILY AT  10  PM 90 tablet 2   hydrochlorothiazide (MICROZIDE) 12.5 MG capsule Take 1 capsule (12.5 mg total) by mouth daily. 90 capsule 3   No current facility-administered medications for this visit.    Allergies:   Amoxicillin, Hydrocodone-acetaminophen, Oxycodone, and Amoxicillin-pot clavulanate   Social History:  The patient  reports that he has never smoked. He has never used smokeless tobacco. He reports current alcohol use. He reports that he does not use drugs.   Family History:  The patient's family history includes Asthma in his father; Glaucoma in his father; Heart attack in his father.   ROS:  Please see the history of present illness.   Otherwise, review of systems is positive for none.   All other systems are reviewed and negative.   PHYSICAL EXAM: VS:  BP 118/74   Pulse (!) 51   Ht 5\' 9"  (1.753 m)   Wt 190 lb (86.2 kg)   SpO2 99%   BMI 28.06 kg/m  , BMI Body mass index is 28.06 kg/m. GEN: Well nourished, well developed, in no acute distress  HEENT: normal  Neck: no JVD, carotid bruits, or masses Cardiac: RRR; no murmurs, rubs, or gallops,no edema  Respiratory:  clear to auscultation bilaterally, normal work of breathing GI: soft, nontender, nondistended, + BS MS: no deformity or atrophy  Skin: warm and dry Neuro:  Strength and sensation are intact Psych: euthymic mood, full affect  EKG:  EKG is ordered today. Personal review of the ekg ordered shows sinus rhythm, rate 51   Recent Labs: 02/21/2022: BUN 20; Creatinine, Ser 1.05; Hemoglobin 15.2; Platelets 242; Potassium 4.5; Sodium 137    Lipid Panel     Component Value Date/Time   CHOL 217 (H) 09/16/2019 0431   TRIG 132 09/16/2019 0431   HDL 42 09/16/2019 0431   CHOLHDL 5.2 09/16/2019 0431   VLDL 26 09/16/2019 0431   LDLCALC 149 (H) 09/16/2019 0431     Wt Readings from Last 3  Encounters:  12/07/22 190 lb (86.2 kg)  06/04/22 184 lb 3.2 oz (83.6 kg)  05/30/22 186 lb 3.2 oz (84.5 kg)      Other studies Reviewed: Additional studies/ records that were reviewed today include: TTE 09/16/19  Review of the above records today demonstrates:   1. Left ventricular ejection fraction, by visual estimation, is 50 to  55%. The left ventricle has low normal function. Left ventricular septal  wall thickness was mildly increased.   2. Left ventricular diastolic parameters are consistent with Grade I  diastolic dysfunction (impaired relaxation).   3. Global right ventricle has normal systolic function.The right  ventricular size is normal. Right vetricular wall thickness was not  assessed.   4. Left atrial size was normal.   5. Right atrial size was normal.   6. The mitral valve is  normal in structure. Trivial mitral valve  regurgitation. No evidence of mitral stenosis.   7. The tricuspid valve is normal in structure. Tricuspid valve  regurgitation is trivial.   8. The aortic valve is tricuspid. Aortic valve regurgitation is trivial.  Mild aortic valve sclerosis without stenosis.   9. The pulmonic valve was normal in structure. Pulmonic valve  regurgitation is trivial.    ASSESSMENT AND PLAN:  1.  Paroxysmal atrial fibrillation: CHA2DS2-VASc of 4.  Currently on Eliquis.  Status post ablation 03/07/2022.  He has had 1 short-lived episode of atrial fibrillation but otherwise no further arrhythmias.  Abigail Teall continue with current management.  2.  Hypertension: Currently well-controlled  3.  Second hypercoagulable state: Currently on Eliquis for atrial fibrillation   Current medicines are reviewed at length with the patient today.   The patient does not have concerns regarding his medicines.  The following changes were made today: None  Labs/ tests ordered today include:  Orders Placed This Encounter  Procedures   Basic metabolic panel   CBC   EKG 12-Lead      Disposition:   FU 6 months  Signed, Sema Stangler Jorja Loa, MD  12/07/2022 12:05 PM     [facility] [address] [place] [address] [phone] (office) [phone] (fax)

## 2022-12-08 LAB — BASIC METABOLIC PANEL
BUN/Creatinine Ratio: 19 (ref 10–24)
BUN: 22 mg/dL (ref 8–27)
CO2: 20 mmol/L (ref 20–29)
Calcium: 9.5 mg/dL (ref 8.6–10.2)
Chloride: 103 mmol/L (ref 96–106)
Creatinine, Ser: 1.14 mg/dL (ref 0.76–1.27)
Glucose: 98 mg/dL (ref 70–99)
Potassium: 4.3 mmol/L (ref 3.5–5.2)
Sodium: 139 mmol/L (ref 134–144)
eGFR: 66 mL/min/{1.73_m2} (ref >59)

## 2022-12-08 LAB — CBC
Hematocrit: 45.3 % (ref 37.5–51.0)
Hemoglobin: 15.3 g/dL (ref 13.0–17.7)
MCH: 30.9 pg (ref 26.6–33.0)
MCHC: 33.8 g/dL (ref 31.5–35.7)
MCV: 92 fL (ref 79–97)
Platelets: 229 10*3/uL (ref 150–450)
RBC: 4.95 x10E6/uL (ref 4.14–5.80)
RDW: 12.3 % (ref 11.6–15.4)
WBC: 7 10*3/uL (ref 3.4–10.8)

## 2023-01-28 ENCOUNTER — Ambulatory Visit
Admission: RE | Admit: 2023-01-28 | Discharge: 2023-01-28 | Disposition: A | Discharge status: Home / Self Care | Payer: Medicare HMO | Source: Ambulatory Visit | Attending: Urgent Care | Admitting: Urgent Care

## 2023-01-28 VITALS — BP 125/85 | HR 57 | Temp 97.8°F | Resp 20

## 2023-01-28 DIAGNOSIS — H6993 Unspecified Eustachian tube disorder, bilateral: Secondary | ICD-10-CM | POA: Diagnosis not present

## 2023-01-28 DIAGNOSIS — H6593 Unspecified nonsuppurative otitis media, bilateral: Secondary | ICD-10-CM | POA: Diagnosis not present

## 2023-01-28 MED ORDER — BUDESONIDE 32 MCG/ACT NA SUSP: 1.0000 | Freq: Every day | NASAL | 0 refills | Status: DC

## 2023-01-28 MED ORDER — PREDNISONE 20 MG PO TABS: 20.0000 mg | ORAL_TABLET | Freq: Every day | ORAL | 0 refills | Status: AC

## 2023-01-28 MED ORDER — DOXYCYCLINE HYCLATE 100 MG PO CAPS: 100.0000 mg | ORAL_CAPSULE | Freq: Two times a day (BID) | ORAL | 0 refills | Status: DC

## 2023-01-28 NOTE — Discharge Instructions (Addendum)
You have middle ear fluid that seems to be causing your dizziness. Please stop your claritin. Start using budesonide nasal spray (called in today) in place of your flonase.  Spray at a 45 degree angle into your nare, pointed toward your opposite eye. Take a deep inhalation while pushing down on the dispenser. Please take one tablet of prednisone each morning with breakfast. Please only start taking the antibiotic as prescribed if your symptoms persist after completing the course of prednisone. If you develop instability, slurred speech or weakness, please head to ER. If your symptoms persist, please follow up with your PCP

## 2023-01-28 NOTE — ED Provider Notes (Signed)
[facility]    CSN: [id number] Arrival date & time: 01/28/23  1246      History   Chief Complaint Chief Complaint  Patient presents with   Dizziness    Worsening left ear fullness, pressure in head, soreness in left neck. Previously diagnosed with BPV.   Have been doing BPV exercises 2 wks, no improvement. - Entered by patient    HPI Jeffrey Contreras is a 77 y.o. male.   Pleasant 77 year old male presents today due to concerns of " a rocking sensation."  Patient states he has had BPPV in the past, diagnosed he believes in 2018.  States he gets this intermittently, last episode he is aware of was in 2023.  3 weeks ago, patient states he started with this rocking sensation as if he is on a boat.  He has been doing his Epley maneuver which usually resolves his BPPV, but states it has not resolved at this time.  He denies the sensation of lightheadedness or presyncope.  He states if he sticks his finger in his left auditory canal, that seems to provide him some relief.  He also reports that holding his nose and pretending to blow, will also provide some relief.  He does have a history of seasonal allergies and reports 1 month ago, he restarted taking his Claritin at around the same time in which his symptoms started.  He denies a fever.  He denies gait instability, headache, dizziness, weakness, slurred speech, or any additional neurological symptoms.   Dizziness   Past Medical History:  Diagnosis Date   Atrial fibrillation (HCC) 09/2019   Atrial fibrillation with RVR (HCC) 09/16/2019   Benign paroxysmal positional vertigo of left ear 01/21/2018   Hallpike positive left ear down for rotational nystagmus.  Formatting of this note might be different from the original. Hallpike positive left ear down for rotational nystagmus.   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 Continues with fullness in the ears with intermittent popping.  Causes intermittent unsteadiness.  Presents to discuss ventilation tube placement. EXAMINATION reveals normal external canals and tympanic membranes. PLAN: I am not sure if this is more an inner ear or middle ear problem.  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   Nuclear sclerotic cataract of right eye 11/16/2021   Glucose intolerance 06/08/2021   History of malignant melanoma 06/08/2021   Hyperlipidemia 06/08/2021   Venous insufficiency 06/08/2021   Atrial fibrillation with RVR (HCC) 09/16/2019   Essential hypertension 09/16/2019   Hypokalemia 09/16/2019   Renal insufficiency 09/16/2019   Costochondritis 09/16/2019   Glaucoma 09/2019   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, moderate stage 06/08/2019   Benign paroxysmal positional vertigo of left ear 01/21/2018   Dysfunction of left eustachian tube 02/11/2017   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 Authorizing Provider  budesonide (RHINOCORT AQUA) 32 MCG/ACT nasal spray Place 1 spray into both nostrils daily. 01/28/23  Yes  Waver Dibiasio L, PA  doxycycline (VIBRAMYCIN) 100 MG capsule Take 1 capsule (100 mg total) by mouth 2 (two) times daily. 01/28/23  Yes Jaylnn Ullery L, PA  predniSONE (DELTASONE) 20 MG tablet Take 1 tablet (20 mg total) by mouth daily with breakfast for 5 days. 01/28/23 02/02/23 Yes Treyon Wymore L, PA  apixaban (ELIQUIS) 5 MG TABS tablet Take 1 tablet by mouth twice daily 11/19/22   Camnitz, Andree Coss, MD  atorvastatin (LIPITOR) 40 MG tablet Take 1 tablet (40 mg total) by mouth daily. TAKE 1 TABLET BY MOUTH ONCE DAILY AT  6  PM 06/04/22   Camnitz, Andree Coss, MD  brimonidine-timolol (COMBIGAN) 0.2-0.5 % ophthalmic solution Place 1 drop into both eyes 2 (two) times daily. 11/11/19   [provider]  CHIA SEED PO Take by mouth.    [provider]  diltiazem (CARDIZEM) 30 MG tablet Take 1 tablet (30 mg total) by mouth every 4 (four) hours as needed (heart rate greater than 120 bpm or in afib). 09/04/22   Baldo Daub, MD  dorzolamide (TRUSOPT) 2 % ophthalmic solution Place 1 drop into the left eye 3 (three) times daily.    [provider]  eplerenone (INSPRA) 25 MG tablet Take 1 tablet (25 mg total) by mouth daily. 06/04/22   Camnitz, Andree Coss, MD  hydrochlorothiazide (MICROZIDE) 12.5 MG capsule Take 1 capsule (12.5 mg total) by mouth daily. 05/30/22 08/28/22  Baldo Daub, MD  loratadine (CLARITIN) 10 MG tablet Take 10 mg by mouth daily.    [provider]  Misc Natural Products (PUMPKIN SEED OIL) CAPS Take 2 capsules by mouth daily. 1,000 mg per cap    [provider]  telmisartan (MICARDIS) 40 MG tablet Take 1 tablet (40 mg total) by mouth daily. TAKE 1 TABLET BY MOUTH ONCE DAILY AT  10  PM 06/04/22   Camnitz, Andree Coss, MD    Family History Family History  Problem Relation Age of Onset   Asthma Father    Heart attack Father    Glaucoma Father     Social History Social History   Tobacco Use   Smoking status: Never   Smokeless  tobacco: Never  Vaping Use   Vaping Use: Never used  Substance Use Topics   Alcohol use: Yes    Comment: occ   Drug use: Never     Allergies   Amoxicillin, Hydrocodone-acetaminophen, Oxycodone, and Amoxicillin-pot clavulanate   Review of Systems Review of Systems  Neurological:  Positive for dizziness.  As per HPI   Physical Exam Triage Vital Signs ED Triage Vitals  Enc Vitals Group     BP 01/28/23 1304 125/85     Pulse Rate 01/28/23 1304 (!) 57     Resp 01/28/23 1302 20     Temp 01/28/23 1302 97.8 F (36.6 C)     Temp Source 01/28/23 1302 Oral     SpO2 01/28/23 1302 98 %     Weight --      Height --  Head Circumference --      Peak Flow --      Pain Score 01/28/23 1300 4     Pain Loc --      Pain Edu? --      Excl. in GC? --    Orthostatic VS for the past 24 hrs:  BP- Lying Pulse- Lying BP- Sitting Pulse- Sitting BP- Standing at 0 minutes Pulse- Standing at 0 minutes  01/28/23 1305 134/78 54 (!) 156/95 55 147/84 57    Updated Vital Signs BP 125/85   Pulse (!) 57   Temp 97.8 F (36.6 C) (Oral)   Resp 20   SpO2 100%   Visual Acuity Right Eye Distance:   Left Eye Distance:   Bilateral Distance:    Right Eye Near:   Left Eye Near:    Bilateral Near:     Physical Exam Vitals and nursing note reviewed. Exam conducted with a chaperone present.  Constitutional:      General: He is not in acute distress.    Appearance: Normal appearance. He is normal weight. He is not ill-appearing, toxic-appearing or diaphoretic.     Comments: Pt smiling, talking, sitting upright  HENT:     Head: Normocephalic and atraumatic.     Jaw: There is normal jaw occlusion. No trismus, tenderness, swelling, pain on movement or malocclusion.     Salivary Glands: Right salivary gland is not diffusely enlarged or tender. Left salivary gland is not diffusely enlarged or tender.     Right Ear: Ear canal and external ear normal. No drainage or swelling. A middle ear effusion  (air/fluid level of white fluid) is present. There is no impacted cerumen. No foreign body. No mastoid tenderness. Tympanic membrane is scarred. Tympanic membrane is not perforated, erythematous, retracted or bulging. Tympanic membrane has normal mobility.     Left Ear: Ear canal and external ear normal. No drainage or swelling. A middle ear effusion (air/fluid level of white fluid) is present. There is no impacted cerumen. No foreign body. No mastoid tenderness. Tympanic membrane is scarred. Tympanic membrane is not perforated, erythematous, retracted or bulging. Tympanic membrane has normal mobility.     Nose: Nose normal. No nasal tenderness, congestion or rhinorrhea.     Right Turbinates: Not enlarged or swollen.     Left Turbinates: Not enlarged or swollen.     Right Sinus: No maxillary sinus tenderness or frontal sinus tenderness.     Left Sinus: No maxillary sinus tenderness or frontal sinus tenderness.     Mouth/Throat:     Lips: Pink.     Mouth: Mucous membranes are moist.     Tongue: No lesions.     Palate: No mass.     Pharynx: Oropharynx is clear. Uvula midline. No pharyngeal swelling, oropharyngeal exudate, posterior oropharyngeal erythema or uvula swelling.  Eyes:     General: No visual field deficit or scleral icterus.       Right eye: No discharge.        Left eye: No discharge.     Extraocular Movements: Extraocular movements intact.     Conjunctiva/sclera: Conjunctivae normal.     Pupils: Pupils are equal, round, and reactive to light.  Cardiovascular:     Rate and Rhythm: Normal rate and regular rhythm.     Pulses: Normal pulses.     Heart sounds: No murmur heard. Pulmonary:     Effort: Pulmonary effort is normal. No respiratory distress.     Breath sounds: Normal breath  sounds. No stridor. No rhonchi.  Chest:     Chest wall: No tenderness.  Musculoskeletal:        General: No swelling, tenderness, deformity or signs of injury. Normal range of motion.     Cervical  back: Normal range of motion and neck supple. No rigidity or tenderness.     Right lower leg: No edema.     Left lower leg: No edema.  Lymphadenopathy:     Cervical: No cervical adenopathy.  Skin:    General: Skin is warm and dry.     Coloration: Skin is not jaundiced.     Findings: No bruising, erythema, lesion or rash.  Neurological:     General: No focal deficit present.     Mental Status: He is alert and oriented to person, place, and time. Mental status is at baseline.     Cranial Nerves: Cranial nerves 2-12 are intact. No cranial nerve deficit, dysarthria or facial asymmetry.     Sensory: Sensation is intact. No sensory deficit.     Motor: Motor function is intact. No weakness, tremor, atrophy, abnormal muscle tone or pronator drift.     Coordination: Coordination is intact. Romberg sign negative. Coordination normal. Finger-Nose-Finger Test normal.     Gait: Gait is intact. Gait normal.     Deep Tendon Reflexes: Reflexes are normal and symmetric.  Psychiatric:        Mood and Affect: Mood normal.        Behavior: Behavior normal.        Thought Content: Thought content normal.        Judgment: Judgment normal.      UC Treatments / Results  Labs (all labs ordered are listed, but only abnormal results are displayed) Labs Reviewed - No data to display  EKG   Radiology No results found.  Procedures Procedures (including critical care time)  Medications Ordered in UC Medications - No data to display  Initial Impression / Assessment and Plan / UC Course  I have reviewed the triage vital signs and the nursing notes.  Pertinent labs & imaging results that were available during my care of the patient were reviewed by me and considered in my medical decision making (see chart for details).     Middle ear effusion -suspect patient's symptoms to be secondary to his middle ear effusion and eustachian tube dysfunction.  Per chart review this does state back to 2018.  Would  recommend that patient stop his Claritin.  We will have him do a short course of 20 mg prednisone in addition to budesonide nasal spray in place of his current Flonase.  Should his symptoms persist past 5 days, will add doxycycline as the fluid behind his ears appears to be possibly turning infectious. Eustachian tube dysfunction -we discussed the appropriate way to administer the nasal spray to help with opening the eustachian tube. F/U with ENT should sx persist    Final Clinical Impressions(s) / UC Diagnoses   Final diagnoses:  Middle ear effusion, bilateral  Eustachian tube dysfunction, bilateral     Discharge Instructions      You have middle ear fluid that seems to be causing your dizziness. Please stop your claritin. Start using budesonide nasal spray (called in today) in place of your flonase.  Spray at a 45 degree angle into your nare, pointed toward your opposite eye. Take a deep inhalation while pushing down on the dispenser. Please take one tablet of prednisone each morning with breakfast. Please  only start taking the antibiotic as prescribed if your symptoms persist after completing the course of prednisone. If you develop instability, slurred speech or weakness, please head to ER. If your symptoms persist, please follow up with your PCP    ED Prescriptions     Medication Sig Dispense Auth. Provider   predniSONE (DELTASONE) 20 MG tablet Take 1 tablet (20 mg total) by mouth daily with breakfast for 5 days. 5 tablet Edvardo Honse L, PA   doxycycline (VIBRAMYCIN) 100 MG capsule Take 1 capsule (100 mg total) by mouth 2 (two) times daily. 14 capsule Solash Tullo L, PA   budesonide (RHINOCORT AQUA) 32 MCG/ACT nasal spray Place 1 spray into both nostrils daily. 8.43 mL Virda Betters L, PA      PDMP not reviewed this encounter.   Maretta Bees, [place] 01/28/23 1408

## 2023-01-28 NOTE — ED Triage Notes (Signed)
Pt presents to [facility] with co of dizziness, rocking "feels like he is on a boat". Pt st he has a lot of head and eye pressure and left ear pressure. Pt reports he has been diagnosed with positional vertigo but this episode started about 3 weeks ago and continues to worsen.

## 2023-02-27 ENCOUNTER — Other Ambulatory Visit: Payer: Self-pay | Admitting: Cardiology

## 2023-04-22 ENCOUNTER — Ambulatory Visit
Admission: EM | Admit: 2023-04-22 | Discharge: 2023-04-22 | Disposition: A | Discharge status: Home / Self Care | Payer: Medicare HMO | Attending: Family Medicine | Admitting: Family Medicine

## 2023-04-22 ENCOUNTER — Other Ambulatory Visit: Payer: Self-pay

## 2023-04-22 DIAGNOSIS — S61412A Laceration without foreign body of left hand, initial encounter: Secondary | ICD-10-CM

## 2023-04-22 NOTE — Discharge Instructions (Signed)
Try to keep hand dry.  If it becomes briefly wet, pat dry and put a new dry dressing on Try to keep tapes on for 5 to 7 days.  After 7 days you can carefully soak them off Call or return anytime

## 2023-04-22 NOTE — ED Triage Notes (Signed)
Laceration to left hand by dog paw

## 2023-04-22 NOTE — ED Provider Notes (Signed)
[facility]    CSN: [id number] Arrival date & time: 04/22/23  1222      History   Chief Complaint No chief complaint on file.   HPI Jeffrey Contreras is a 77 y.o. male.   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  One of the dogs wanted his attention so put his paw up onto Bob's hand.  Unfortunately the toenail tore his hand causing a skin tear.  Bleeding controlled with pressure. Last tetanus is Jan 03, 2019    Past Medical History:  Diagnosis Date   Atrial fibrillation (HCC) 09/2019   Atrial fibrillation with RVR (HCC) 09/16/2019   Benign paroxysmal positional vertigo of left ear 01/21/2018   Hallpike positive left ear down for rotational nystagmus.  Formatting of this note might be different from the original. Hallpike positive left ear down for rotational nystagmus.   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 Continues with fullness in the ears with intermittent popping.  Causes intermittent unsteadiness.  Presents to discuss ventilation tube placement. EXAMINATION reveals normal external canals and tympanic membranes. PLAN: I am not sure if this is more an inner ear or middle ear problem.  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   Nuclear sclerotic cataract of right eye 11/16/2021   Glucose intolerance 06/08/2021   History of malignant melanoma 06/08/2021   Hyperlipidemia 06/08/2021   Venous insufficiency  06/08/2021   Atrial fibrillation with RVR (HCC) 09/16/2019   Essential hypertension 09/16/2019   Hypokalemia 09/16/2019   Renal insufficiency 09/16/2019   Costochondritis 09/16/2019   Glaucoma 09/2019   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 Authorizing Provider  apixaban (ELIQUIS) 5 MG TABS tablet Take 1 tablet by mouth twice daily 11/19/22   Camnitz, Andree Coss, MD  atorvastatin (LIPITOR) 40 MG tablet Take 1 tablet (40 mg total) by mouth daily. 02/27/23   Camnitz, Will Daphine Deutscher, MD  brimonidine-timolol (COMBIGAN) 0.2-0.5 % ophthalmic solution Place 1 drop into both eyes 2 (two) times daily. 11/11/19   [provider]  budesonide (RHINOCORT AQUA) 32 MCG/ACT nasal spray Place 1 spray into both nostrils daily. 01/28/23   Crain, Whitney L, PA  diltiazem (CARDIZEM) 30 MG tablet Take 1 tablet (30 mg total) by mouth every 4 (four) hours  as needed (heart rate greater than 120 bpm or in afib). 09/04/22   Baldo Daub, MD  dorzolamide (TRUSOPT) 2 % ophthalmic solution Place 1 drop into the left eye 3 (three) times daily.    [provider]  eplerenone (INSPRA) 25 MG tablet Take 1 tablet by mouth once daily 02/27/23   Camnitz, Andree Coss, MD  hydrochlorothiazide (MICROZIDE) 12.5 MG capsule Take 1 capsule (12.5 mg total) by mouth daily. 05/30/22 08/28/22  Baldo Daub, MD  loratadine (CLARITIN) 10 MG tablet Take 10 mg by mouth daily.    [provider]  Misc Natural Products (PUMPKIN SEED OIL) CAPS Take 2 capsules by mouth daily. 1,000 mg per cap    [provider]  telmisartan (MICARDIS) 40 MG tablet Take 1 tablet (40 mg total) by mouth daily. 02/27/23   Camnitz, Andree Coss, MD    Family History Family History  Problem Relation Age of Onset   Asthma Father    Heart attack Father    Glaucoma Father     Social History Social History   Tobacco Use   Smoking status: Never   Smokeless tobacco: Never  Vaping Use   Vaping status: Never Used  Substance Use Topics   Alcohol use: Yes    Comment: occ   Drug use: Never     Allergies   Amoxicillin, Hydrocodone-acetaminophen, Oxycodone, and Amoxicillin-pot clavulanate   Review of Systems Review of Systems  See HPI Physical Exam Triage Vital Signs ED Triage Vitals  Encounter Vitals Group     BP 04/22/23 1255 (!) 147/84     Systolic BP Percentile --      Diastolic BP Percentile --      Pulse Rate 04/22/23 1255 61     Resp 04/22/23 1255 16     Temp 04/22/23 1255 98.4 F (36.9 C)     Temp Source 04/22/23 1255 Oral     SpO2 04/22/23 1255 99 %     Weight --      Height --      Head Circumference --      Peak Flow --      Pain Score 04/22/23 1256 0     Pain Loc --      Pain Education --      Exclude from Growth Chart --    No data found.  Updated Vital Signs BP (!) 147/84 (BP Location: Left Arm)   Pulse 61   Temp 98.4 F (36.9 C) (Oral)   Resp 16   SpO2 99%      Physical Exam Vitals reviewed.  Constitutional:      General: He is not in acute distress.    Appearance: He is well-developed and normal weight.  HENT:     Head: Normocephalic and atraumatic.  Eyes:     Conjunctiva/sclera: Conjunctivae normal.     Pupils: Pupils are equal, round, and reactive to light.  Cardiovascular:     Rate and Rhythm: Normal rate.  Pulmonary:     Effort:  Pulmonary effort is normal. No respiratory distress.  Abdominal:     General: There is no distension.     Palpations: Abdomen is soft.  Musculoskeletal:        General: Normal range of motion.     Cervical back: Normal range of motion.  Skin:    General: Skin is warm and dry.     Comments: Skin tears on the dorsum of the left hand.  It is V  shaped.  It is 4 cm total length.  Edges are well-approximated and pressure applied.  Steri-Strips were placed  Neurological:     Mental Status: He is alert.      UC Treatments / Results  Labs (all labs ordered are listed, but only abnormal results are displayed) Labs Reviewed - No data to display  EKG   Radiology No results found.  Procedures Procedures (including critical care time)  Medications Ordered in UC Medications - No data to display  Initial Impression / Assessment and Plan / UC Course  I have reviewed the triage vital signs and the nursing notes.  Pertinent labs & imaging results that were available during my care of the patient were reviewed by me and considered in my medical decision making (see chart for details).     Wound care discussed Final Clinical Impressions(s) / UC Diagnoses   Final diagnoses:  None     Discharge Instructions      Try to keep hand dry.  If it becomes briefly wet, pat dry and put a new dry dressing on Try to keep tapes on for 5 to 7 days.  After 7 days you can carefully soak them off Call or return anytime   ED Prescriptions   None    PDMP not reviewed this encounter.   Eustace Moore, MD 04/22/23 [phone]

## 2023-05-02 ENCOUNTER — Other Ambulatory Visit: Payer: Self-pay | Admitting: Cardiology

## 2023-05-02 DIAGNOSIS — I480 Paroxysmal atrial fibrillation: Secondary | ICD-10-CM

## 2023-05-02 NOTE — Telephone Encounter (Signed)
Prescription refill request for Eliquis received. Indication:Afib  Last office visit: 12/07/22 (Camnitz)  Scr: 1.14 (12/07/22)  Age:  77 Weight: 86.2kg  Appropriate dose. Refill sent.

## 2023-05-15 ENCOUNTER — Other Ambulatory Visit: Payer: Self-pay | Admitting: Cardiology

## 2023-05-16 ENCOUNTER — Encounter: Payer: Self-pay | Admitting: Student

## 2023-05-16 ENCOUNTER — Ambulatory Visit: Payer: Medicare HMO | Attending: Student | Admitting: Student

## 2023-05-16 VITALS — BP 146/88 | HR 60 | Ht 69.0 in | Wt 192.0 lb

## 2023-05-16 DIAGNOSIS — Z79899 Other long term (current) drug therapy: Secondary | ICD-10-CM | POA: Diagnosis not present

## 2023-05-16 DIAGNOSIS — I480 Paroxysmal atrial fibrillation: Secondary | ICD-10-CM | POA: Diagnosis not present

## 2023-05-16 DIAGNOSIS — D6869 Other thrombophilia: Secondary | ICD-10-CM

## 2023-05-16 DIAGNOSIS — I10 Essential (primary) hypertension: Secondary | ICD-10-CM | POA: Diagnosis not present

## 2023-05-16 NOTE — Patient Instructions (Signed)
Medication Instructions:  Your physician recommends that you continue on your current medications as directed. Please refer to the Current Medication list given to you today.  *If you need a refill on your cardiac medications before your next appointment, please call your pharmacy*  Lab Work: BMET, CBC-TODAY If you have labs (blood work) drawn today and your tests are completely normal, you will receive your results only by: MyChart Message (if you have MyChart) OR A paper copy in the mail If you have any lab test that is abnormal or we need to change your treatment, we will call you to review the results.  Follow-Up: At [facility], you and your health needs are our priority.  As part of our continuing mission to provide you with exceptional heart care, we have created designated Provider Care Teams.  These Care Teams include your primary Cardiologist (physician) and Advanced Practice Providers (APPs -  Physician Assistants and Nurse Practitioners) who all work together to provide you with the care you need, when you need it.  Your next appointment:   6 month(s)  Provider:   Loman Brooklyn, MD or Otilio Saber, PA-C

## 2023-05-16 NOTE — Progress Notes (Signed)
Electrophysiology Office Note:   Date:  05/16/2023  ID:  [name], DOB 1946/01/11, MRN [id number]  Primary Cardiologist: Norman Herrlich, MD Electrophysiologist: Regan Lemming, MD      History of Present Illness:   Jeffrey Contreras is a 77 y.o. male with h/o PAF, HTN, and Secondary hypercoagulable state seen today for routine electrophysiology followup.   Since last being seen in our clinic the patient reports doing very well. He feels he has had three separate episodes of AF since last visit, all confirmed by his apple watch and Kardia.   All have lasted <4 hours, potentially as short as 30 minutes. Usually he will take a diltiazem and go rest for a few hours, with a normal rhythm when he gets back up. he denies chest pain, dyspnea, PND, orthopnea, nausea, vomiting, dizziness, syncope, edema, weight gain, or early satiety.   Review of systems complete and found to be negative unless listed in HPI.   EP Information / Studies Reviewed:    EKG is ordered today. Personal review as below.  EKG Interpretation Date/Time:  Thursday May 16 2023 11:54:10 EDT Ventricular Rate:  57 PR Interval:  178 QRS Duration:  80 QT Interval:  424 QTC Calculation: 412 R Axis:   0  Text Interpretation: Sinus bradycardia Confirmed by Maxine Glenn [phone]) on 05/16/2023 11:59:30 AM     Physical Exam:   VS:  BP (!) 146/88   Pulse 60   Ht 5\' 9"  (1.753 m)   Wt 192 lb (87.1 kg)   SpO2 95%   BMI 28.35 kg/m    Wt Readings from Last 3 Encounters:  05/16/23 192 lb (87.1 kg)  12/07/22 190 lb (86.2 kg)  06/04/22 184 lb 3.2 oz (83.6 kg)     GEN: Well nourished, well developed in no acute distress NECK: No JVD; No carotid bruits CARDIAC: Regular rate and rhythm, no murmurs, rubs, gallops RESPIRATORY:  Clear to auscultation without rales, wheezing or rhonchi  ABDOMEN: Soft, non-tender, non-distended EXTREMITIES:  No edema; No deformity   ASSESSMENT AND PLAN:    Paroxysmal Atrial  Fibrillation  S/p Ablation 03/07/2022 Continue Eliquis 5mg  BID for CHA2DS2VASC of at least 4   Continue diltiazem prn Mild and rare breakthrough. Discussed options if it were to become more persistent in the future, including AAD and re-do ablation. For now, pt and we are satisfied with his control.   HTN Normal with home checks. No change today.   Secondary hypercoagulable state Pt on Eliquis as above   Follow up with Dr. Elberta Fortis in 6 months  Signed, Graciella Freer, PA-C

## 2023-05-17 LAB — CBC
Hematocrit: 46.3 % (ref 37.5–51.0)
Hemoglobin: 15.1 g/dL (ref 13.0–17.7)
MCH: 31.1 pg (ref 26.6–33.0)
MCHC: 32.6 g/dL (ref 31.5–35.7)
MCV: 96 fL (ref 79–97)
Platelets: 235 10*3/uL (ref 150–450)
RBC: 4.85 x10E6/uL (ref 4.14–5.80)
RDW: 12.5 % (ref 11.6–15.4)
WBC: 6.4 10*3/uL (ref 3.4–10.8)

## 2023-05-17 LAB — BASIC METABOLIC PANEL
BUN/Creatinine Ratio: 21 (ref 10–24)
BUN: 20 mg/dL (ref 8–27)
CO2: 24 mmol/L (ref 20–29)
Calcium: 9.3 mg/dL (ref 8.6–10.2)
Chloride: 102 mmol/L (ref 96–106)
Creatinine, Ser: 0.95 mg/dL (ref 0.76–1.27)
Glucose: 107 mg/dL — ABNORMAL HIGH (ref 70–99)
Potassium: 4.2 mmol/L (ref 3.5–5.2)
Sodium: 140 mmol/L (ref 134–144)
eGFR: 82 mL/min/{1.73_m2} (ref >59)

## 2023-06-12 ENCOUNTER — Other Ambulatory Visit: Payer: Self-pay | Admitting: Cardiology

## 2023-06-14 ENCOUNTER — Telehealth: Payer: Self-pay | Admitting: Cardiology

## 2023-06-14 ENCOUNTER — Other Ambulatory Visit: Payer: Self-pay

## 2023-06-14 MED ORDER — HYDROCHLOROTHIAZIDE 12.5 MG PO CAPS: 12.5000 mg | ORAL_CAPSULE | Freq: Every day | ORAL | 0 refills | Status: DC

## 2023-06-14 NOTE — Telephone Encounter (Signed)
Hydrochlorothiazide 12.5mg  q d #90 sent to pharmacy. Added not that pt should make appt for further refills.

## 2023-06-14 NOTE — Telephone Encounter (Signed)
*  STAT* If patient is at the pharmacy, call can be transferred to refill team.   1. Which medications need to be refilled? (please list name of each medication and dose if known)   hydrochlorothiazide (MICROZIDE) 12.5 MG capsule    2. Which pharmacy/location (including street and city if local pharmacy) is medication to be sent to? [facility] - [place], [place] - [address]    3. Do they need a 30 day or 90 day supply? 90 day

## 2023-09-08 ENCOUNTER — Other Ambulatory Visit: Payer: Self-pay | Admitting: Cardiology

## 2023-09-09 NOTE — Telephone Encounter (Signed)
Rx refill sent to pharmacy.

## 2023-10-05 ENCOUNTER — Other Ambulatory Visit: Payer: Self-pay | Admitting: Cardiology

## 2023-11-04 ENCOUNTER — Other Ambulatory Visit: Payer: Self-pay | Admitting: Cardiology

## 2023-11-10 ENCOUNTER — Other Ambulatory Visit: Payer: Self-pay | Admitting: Cardiology

## 2023-11-10 DIAGNOSIS — I480 Paroxysmal atrial fibrillation: Secondary | ICD-10-CM

## 2023-11-11 ENCOUNTER — Encounter: Payer: Self-pay | Admitting: Cardiology

## 2023-11-11 NOTE — Telephone Encounter (Signed)
 Prescription refill request for Eliquis received. Indication:afib Last office visit:10/24 Scr:1.11  12/24 Age: 78 Weight:87.1  kg  Prescription refilled

## 2023-11-12 NOTE — Telephone Encounter (Signed)
 Left message to call back

## 2023-11-13 NOTE — Telephone Encounter (Signed)
  Pt's wife is returning call. She said, to call her at   [phone]

## 2023-11-14 MED ORDER — HYDROCHLOROTHIAZIDE 12.5 MG PO CAPS: 12.5000 mg | ORAL_CAPSULE | Freq: Every day | ORAL | 0 refills | Status: DC

## 2023-11-14 NOTE — Telephone Encounter (Signed)
 Pt's spouse calling again to request cb

## 2023-11-14 NOTE — Telephone Encounter (Signed)
 Spoke withGail per DPR who states that the patient has an appointment with Dr. Elberta Fortis 11/25/23. Refill sent for 15 tablets until FU.

## 2023-11-25 ENCOUNTER — Ambulatory Visit: Payer: Medicare HMO | Attending: Cardiology | Admitting: Cardiology

## 2023-11-25 ENCOUNTER — Encounter: Payer: Self-pay | Admitting: Cardiology

## 2023-11-25 VITALS — BP 116/80 | HR 52 | Ht 69.0 in | Wt 202.0 lb

## 2023-11-25 DIAGNOSIS — I480 Paroxysmal atrial fibrillation: Secondary | ICD-10-CM | POA: Diagnosis not present

## 2023-11-25 DIAGNOSIS — D6869 Other thrombophilia: Secondary | ICD-10-CM | POA: Diagnosis not present

## 2023-11-25 DIAGNOSIS — I10 Essential (primary) hypertension: Secondary | ICD-10-CM | POA: Diagnosis not present

## 2023-11-25 DIAGNOSIS — R600 Localized edema: Secondary | ICD-10-CM | POA: Diagnosis not present

## 2023-11-25 MED ORDER — FUROSEMIDE 20 MG PO TABS: 20.0000 mg | ORAL_TABLET | Freq: Every day | ORAL | 0 refills | Status: DC

## 2023-11-25 MED ORDER — HYDROCHLOROTHIAZIDE 12.5 MG PO CAPS: 12.5000 mg | ORAL_CAPSULE | Freq: Every day | ORAL | 1 refills | Status: DC

## 2023-11-25 NOTE — Patient Instructions (Signed)
 Medication Instructions:  Your physician has recommended you make the following change in your medication:   ** Begin Lasix 20mg  - 1 tablet by mouth daily as needed for swelling.   *If you need a refill on your cardiac medications before your next appointment, please call your pharmacy*  Lab Work: None ordered.  If you have labs (blood work) drawn today and your tests are completely normal, you will receive your results only by: MyChart Message (if you have MyChart) OR A paper copy in the mail If you have any lab test that is abnormal or we need to change your treatment, we will call you to review the results.  Testing/Procedures: None ordered.   Follow-Up: At [facility], you and your health needs are our priority.  As part of our continuing mission to provide you with exceptional heart care, our providers are all part of one team.  This team includes your primary Cardiologist (physician) and Advanced Practice Providers or APPs (Physician Assistants and Nurse Practitioners) who all work together to provide you with the care you need, when you need it.  Your next appointment:   6 months with Dr Lawana Pray PA      1st Floor: - Lobby - Registration  - Pharmacy  - Lab - Cafe  2nd Floor: - PV Lab - Diagnostic Testing (echo, CT, nuclear med)  3rd Floor: - Vacant  4th Floor: - TCTS (cardiothoracic surgery) - AFib Clinic - Structural Heart Clinic - Vascular Surgery  - Vascular Ultrasound  5th Floor: - HeartCare Cardiology (general and EP) - Clinical Pharmacy for coumadin, hypertension, lipid, weight-loss medications, and med management appointments    Valet parking services will be available as well.

## 2023-11-25 NOTE — Progress Notes (Signed)
  Electrophysiology Office Note:   Date:  11/25/2023  ID:  Jeffrey Contreras, DOB 09/19/1945, MRN [id number]  Primary Cardiologist: Zoe Hinds, MD Primary Heart Failure: None Electrophysiologist: Mayukha Symmonds Cortland Ding, MD      History of Present Illness:   Jeffrey Contreras is a 78 y.o. male with h/o lower extremity edema with venous insufficiency, atrial fibrillation, diabetes, hypertension seen today for routine electrophysiology followup.   Since last being seen in our clinic the patient reports doing well.  He has had no chest pain or shortness of breath.  He has noted 2 episodes of atrial fibrillation in the last year, but have been short-lived, all less than 4 hours.  He does note significant lower extremity edema.  This has been an issue on and off for many years.  he denies chest pain, palpitations, dyspnea, PND, orthopnea, nausea, vomiting, dizziness, syncope, weight gain, or early satiety.   Review of systems complete and found to be negative unless listed in HPI.   EP Information / Studies Reviewed:    EKG is ordered today. Personal review as below.  EKG Interpretation Date/Time:  Monday November 25 2023 12:05:54 EDT Ventricular Rate:  52 PR Interval:  172 QRS Duration:  76 QT Interval:  446 QTC Calculation: 414 R Axis:   7  Text Interpretation: Sinus bradycardia When compared with ECG of 16-May-2023 11:54, No significant change was found Confirmed by Owens Hara ([id number]) on 11/25/2023 12:12:02 PM     Risk Assessment/Calculations:    CHA2DS2-VASc Score = 4   This indicates a 4.8% annual risk of stroke. The patient's score is based upon: CHF History: 0 HTN History: 1 Diabetes History: 1 Stroke History: 0 Vascular Disease History: 0 Age Score: 2 Gender Score: 0             Physical Exam:   VS:  BP 116/80 (BP Location: Right Arm, Patient Position: Sitting, Cuff Size: Large)   Pulse (!) 52   Ht 5\' 9"  (1.753 m)   Wt 202 lb (91.6 kg)   SpO2 97%   BMI 29.83  kg/m    Wt Readings from Last 3 Encounters:  11/25/23 202 lb (91.6 kg)  05/16/23 192 lb (87.1 kg)  12/07/22 190 lb (86.2 kg)     GEN: Well nourished, well developed in no acute distress NECK: No JVD; No carotid bruits CARDIAC: Regular rate and rhythm, no murmurs, rubs, gallops RESPIRATORY:  Clear to auscultation without rales, wheezing or rhonchi  ABDOMEN: Soft, non-tender, non-distended EXTREMITIES: 2+ edema; No deformity   ASSESSMENT AND PLAN:    1.  Paroxysmal atrial fibrillation: Post ablation 03/07/2022.  He has had short episodes of atrial fibrillation but is overall happy with his control.  No changes.  2.  Secondary hypercoagulable state: Currently on Eliquis for atrial fibrillation  3.  Hypertension: Well-controlled  4.  Lower extremity edema: Has 2+ edema today.  Roger Fasnacht start Lasix 20 mg daily as needed.  Follow up with EP APP in 6 months  Signed, Lawrnce Reyez Cortland Ding, MD

## 2023-12-07 ENCOUNTER — Encounter (HOSPITAL_BASED_OUTPATIENT_CLINIC_OR_DEPARTMENT_OTHER): Payer: Self-pay

## 2023-12-07 ENCOUNTER — Emergency Department (HOSPITAL_BASED_OUTPATIENT_CLINIC_OR_DEPARTMENT_OTHER)

## 2023-12-07 ENCOUNTER — Other Ambulatory Visit: Payer: Self-pay

## 2023-12-07 ENCOUNTER — Inpatient Hospital Stay (HOSPITAL_BASED_OUTPATIENT_CLINIC_OR_DEPARTMENT_OTHER)
Admission: EM | Admit: 2023-12-07 | Discharge: 2023-12-12 | DRG: 418 | Disposition: A | Discharge status: Home / Self Care | Attending: Family Medicine | Admitting: Family Medicine

## 2023-12-07 DIAGNOSIS — R1013 Epigastric pain: Secondary | ICD-10-CM | POA: Diagnosis present

## 2023-12-07 DIAGNOSIS — Z825 Family history of asthma and other chronic lower respiratory diseases: Secondary | ICD-10-CM

## 2023-12-07 DIAGNOSIS — E669 Obesity, unspecified: Secondary | ICD-10-CM | POA: Diagnosis present

## 2023-12-07 DIAGNOSIS — Z8249 Family history of ischemic heart disease and other diseases of the circulatory system: Secondary | ICD-10-CM | POA: Diagnosis not present

## 2023-12-07 DIAGNOSIS — I10 Essential (primary) hypertension: Secondary | ICD-10-CM | POA: Diagnosis present

## 2023-12-07 DIAGNOSIS — E785 Hyperlipidemia, unspecified: Secondary | ICD-10-CM | POA: Diagnosis present

## 2023-12-07 DIAGNOSIS — Z83511 Family history of glaucoma: Secondary | ICD-10-CM

## 2023-12-07 DIAGNOSIS — Z7901 Long term (current) use of anticoagulants: Secondary | ICD-10-CM

## 2023-12-07 DIAGNOSIS — H401430 Capsular glaucoma with pseudoexfoliation of lens, bilateral, stage unspecified: Secondary | ICD-10-CM | POA: Diagnosis present

## 2023-12-07 DIAGNOSIS — N179 Acute kidney failure, unspecified: Secondary | ICD-10-CM | POA: Diagnosis not present

## 2023-12-07 DIAGNOSIS — Z885 Allergy status to narcotic agent status: Secondary | ICD-10-CM | POA: Diagnosis not present

## 2023-12-07 DIAGNOSIS — Z881 Allergy status to other antibiotic agents status: Secondary | ICD-10-CM

## 2023-12-07 DIAGNOSIS — Z6829 Body mass index (BMI) 29.0-29.9, adult: Secondary | ICD-10-CM | POA: Diagnosis not present

## 2023-12-07 DIAGNOSIS — E119 Type 2 diabetes mellitus without complications: Secondary | ICD-10-CM | POA: Diagnosis present

## 2023-12-07 DIAGNOSIS — K8065 Calculus of gallbladder and bile duct with chronic cholecystitis with obstruction: Principal | ICD-10-CM | POA: Diagnosis present

## 2023-12-07 DIAGNOSIS — I480 Paroxysmal atrial fibrillation: Secondary | ICD-10-CM | POA: Diagnosis present

## 2023-12-07 DIAGNOSIS — Z880 Allergy status to penicillin: Secondary | ICD-10-CM | POA: Diagnosis not present

## 2023-12-07 DIAGNOSIS — M79675 Pain in left toe(s): Secondary | ICD-10-CM

## 2023-12-07 DIAGNOSIS — M109 Gout, unspecified: Secondary | ICD-10-CM | POA: Diagnosis present

## 2023-12-07 DIAGNOSIS — Z79899 Other long term (current) drug therapy: Secondary | ICD-10-CM

## 2023-12-07 DIAGNOSIS — K8050 Calculus of bile duct without cholangitis or cholecystitis without obstruction: Secondary | ICD-10-CM | POA: Diagnosis not present

## 2023-12-07 DIAGNOSIS — Z7984 Long term (current) use of oral hypoglycemic drugs: Secondary | ICD-10-CM | POA: Diagnosis not present

## 2023-12-07 DIAGNOSIS — R9431 Abnormal electrocardiogram [ECG] [EKG]: Secondary | ICD-10-CM

## 2023-12-07 DIAGNOSIS — K8020 Calculus of gallbladder without cholecystitis without obstruction: Secondary | ICD-10-CM | POA: Diagnosis not present

## 2023-12-07 DIAGNOSIS — K8051 Calculus of bile duct without cholangitis or cholecystitis with obstruction: Secondary | ICD-10-CM | POA: Diagnosis not present

## 2023-12-07 DIAGNOSIS — H401130 Primary open-angle glaucoma, bilateral, stage unspecified: Secondary | ICD-10-CM | POA: Diagnosis present

## 2023-12-07 DIAGNOSIS — R7401 Elevation of levels of liver transaminase levels: Principal | ICD-10-CM

## 2023-12-07 LAB — MAGNESIUM: Magnesium: 2.1 mg/dL (ref 1.7–2.4)

## 2023-12-07 LAB — URINALYSIS, ROUTINE W REFLEX MICROSCOPIC
Glucose, UA: NEGATIVE mg/dL
Hgb urine dipstick: NEGATIVE
Ketones, ur: NEGATIVE mg/dL
Nitrite: NEGATIVE
Protein, ur: NEGATIVE mg/dL
Specific Gravity, Urine: 1.02 (ref 1.005–1.030)
pH: 6 (ref 5.0–8.0)

## 2023-12-07 LAB — CBC
HCT: 45.5 % (ref 39.0–52.0)
Hemoglobin: 15.4 g/dL (ref 13.0–17.0)
MCH: 30.9 pg (ref 26.0–34.0)
MCHC: 33.8 g/dL (ref 30.0–36.0)
MCV: 91.2 fL (ref 80.0–100.0)
Platelets: 274 10*3/uL (ref 150–400)
RBC: 4.99 MIL/uL (ref 4.22–5.81)
RDW: 13.2 % (ref 11.5–15.5)
WBC: 6.2 10*3/uL (ref 4.0–10.5)
nRBC: 0 % (ref 0.0–0.2)

## 2023-12-07 LAB — COMPREHENSIVE METABOLIC PANEL WITH GFR
ALT: 1013 U/L — ABNORMAL HIGH (ref 0–44)
AST: 1147 U/L — ABNORMAL HIGH (ref 15–41)
Albumin: 4.1 g/dL (ref 3.5–5.0)
Alkaline Phosphatase: 183 U/L — ABNORMAL HIGH (ref 38–126)
Anion gap: 18 — ABNORMAL HIGH (ref 5–15)
BUN: 24 mg/dL — ABNORMAL HIGH (ref 8–23)
CO2: 19 mmol/L — ABNORMAL LOW (ref 22–32)
Calcium: 9.7 mg/dL (ref 8.9–10.3)
Chloride: 101 mmol/L (ref 98–111)
Creatinine, Ser: 1.13 mg/dL (ref 0.61–1.24)
GFR, Estimated: >60 mL/min (ref >60)
Glucose, Bld: 181 mg/dL — ABNORMAL HIGH (ref 70–99)
Potassium: 3.6 mmol/L (ref 3.5–5.1)
Sodium: 138 mmol/L (ref 135–145)
Total Bilirubin: 2.5 mg/dL — ABNORMAL HIGH (ref 0.0–1.2)
Total Protein: 6.8 g/dL (ref 6.5–8.1)

## 2023-12-07 LAB — URINALYSIS, MICROSCOPIC (REFLEX)

## 2023-12-07 LAB — ACETAMINOPHEN LEVEL: Acetaminophen (Tylenol), Serum: <10 ug/mL — ABNORMAL LOW (ref 10–30)

## 2023-12-07 LAB — PROTIME-INR
INR: 1.2 (ref 0.8–1.2)
Prothrombin Time: 15.2 s (ref 11.4–15.2)

## 2023-12-07 LAB — LIPASE, BLOOD: Lipase: 82 U/L — ABNORMAL HIGH (ref 11–51)

## 2023-12-07 MED ORDER — SPIRONOLACTONE 25 MG PO TABS
25.0000 mg | ORAL_TABLET | Freq: Every day | ORAL | Status: DC
  Administered 2023-12-08: 25 mg via ORAL
  Filled 2023-12-07 (×2): qty 1

## 2023-12-07 MED ORDER — TIMOLOL MALEATE 0.5 % OP SOLN
1.0000 [drp] | Freq: Two times a day (BID) | OPHTHALMIC | Status: DC
  Administered 2023-12-07 – 2023-12-12 (×10): 1 [drp] via OPHTHALMIC
  Filled 2023-12-07: qty 5

## 2023-12-07 MED ORDER — IRBESARTAN 75 MG PO TABS
75.0000 mg | ORAL_TABLET | Freq: Every day | ORAL | Status: DC
  Administered 2023-12-08 – 2023-12-12 (×4): 75 mg via ORAL
  Filled 2023-12-07 (×5): qty 1

## 2023-12-07 MED ORDER — LORAZEPAM 1 MG PO TABS
1.0000 mg | ORAL_TABLET | Freq: Once | ORAL | Status: AC
  Administered 2023-12-08: 1 mg via ORAL
  Filled 2023-12-07: qty 1

## 2023-12-07 MED ORDER — BRIMONIDINE TARTRATE 0.2 % OP SOLN
1.0000 [drp] | Freq: Two times a day (BID) | OPHTHALMIC | Status: DC
Start: 2023-12-07 — End: 2023-12-12
  Administered 2023-12-07 – 2023-12-12 (×10): 1 [drp] via OPHTHALMIC
  Filled 2023-12-07: qty 5

## 2023-12-07 MED ORDER — IOHEXOL 300 MG/ML  SOLN
100.0000 mL | Freq: Once | INTRAMUSCULAR | Status: AC | PRN
  Administered 2023-12-07: 100 mL via INTRAVENOUS

## 2023-12-07 MED ORDER — DORZOLAMIDE HCL 2 % OP SOLN
1.0000 [drp] | Freq: Three times a day (TID) | OPHTHALMIC | Status: DC
  Administered 2023-12-07 – 2023-12-12 (×14): 1 [drp] via OPHTHALMIC
  Filled 2023-12-07: qty 10

## 2023-12-07 MED ORDER — ATORVASTATIN CALCIUM 40 MG PO TABS: 40.0000 mg | ORAL_TABLET | Freq: Every day | ORAL | Status: DC

## 2023-12-07 MED ORDER — BRIMONIDINE TARTRATE-TIMOLOL 0.2-0.5 % OP SOLN: 1.0000 [drp] | Freq: Two times a day (BID) | OPHTHALMIC | Status: DC

## 2023-12-07 MED ORDER — TRAMADOL HCL 50 MG PO TABS
50.0000 mg | ORAL_TABLET | Freq: Three times a day (TID) | ORAL | Status: DC | PRN
  Administered 2023-12-08: 50 mg via ORAL
  Filled 2023-12-07: qty 1

## 2023-12-07 MED ORDER — HYDROCHLOROTHIAZIDE 12.5 MG PO TABS
12.5000 mg | ORAL_TABLET | Freq: Every day | ORAL | Status: DC
  Administered 2023-12-08: 12.5 mg via ORAL
  Filled 2023-12-07 (×2): qty 1

## 2023-12-07 MED ORDER — MORPHINE SULFATE (PF) 2 MG/ML IV SOLN
2.0000 mg | INTRAVENOUS | Status: DC | PRN
  Administered 2023-12-11: 2 mg via INTRAVENOUS
  Filled 2023-12-07: qty 1

## 2023-12-07 NOTE — Assessment & Plan Note (Signed)
 Hold lipitor with transaminitis

## 2023-12-07 NOTE — Assessment & Plan Note (Signed)
 NSR Hold eliquis  with possible surgery

## 2023-12-07 NOTE — ED Provider Notes (Signed)
 [place] EMERGENCY DEPARTMENT AT [facility] [place] Provider Note   CSN: [id number] Arrival date & time: 12/07/23  1042     History  Chief Complaint  Patient presents with  . Abdominal Pain    Jeffrey Contreras is a 78 y.o. male who presents emergency department with complaint of epigastric abdominal pain.  Patient states he had onset of epigastric pain beginning last night that has been progressively worsening.  He rates it at 7 out of 10 he states it is constant aching worse with deep breathing he denies any nausea or vomiting he denies melena or hematochezia he is on Eliquis  for A-fib.  He denies fevers.  Patient also has unilateral leg swelling on the left.  He states he has had this in the past but does not know why.  He is having severe pain in his right great toe states that started about a week ago and has improved but he still having difficulty walking no injuries.  He denies chest pain shortness of breath.   Abdominal Pain      Home Medications Prior to Admission medications   Medication Sig Start Date End Date Taking? Authorizing Provider  apixaban  (ELIQUIS ) 5 MG TABS tablet Take 1 tablet by mouth twice daily 11/11/23   Camnitz, Babetta Lesch, MD  atorvastatin  (LIPITOR) 40 MG tablet Take 1 tablet (40 mg total) by mouth daily. 02/27/23   Camnitz, Babetta Lesch, MD  brimonidine -timolol  (COMBIGAN ) 0.2-0.5 % ophthalmic solution Place 1 drop into both eyes 2 (two) times daily. 11/11/19   [provider]  budesonide  (RHINOCORT  AQUA) 32 MCG/ACT nasal spray Place 1 spray into both nostrils daily. 01/28/23   Crain, Whitney L, PA  diltiazem  (CARDIZEM ) 30 MG tablet Take 1 tablet (30 mg total) by mouth every 4 (four) hours as needed (heart rate greater than 120 bpm or in afib). 09/04/22   Hassan Links, MD  dorzolamide  (TRUSOPT ) 2 % ophthalmic solution Place 1 drop into the left eye 3 (three) times daily.    [provider]  eplerenone  (INSPRA ) 25 MG tablet Take 1  tablet by mouth once daily 02/27/23   Camnitz, Babetta Lesch, MD  fexofenadine (ALLEGRA) 180 MG tablet Take 180 mg by mouth daily.    [provider]  fluticasone (FLONASE) 50 MCG/ACT nasal spray Place 1 spray into both nostrils daily.    [provider]  furosemide  (LASIX ) 20 MG tablet Take 1 tablet (20 mg total) by mouth daily. 11/25/23   Camnitz, Babetta Lesch, MD  hydrochlorothiazide  (MICROZIDE ) 12.5 MG capsule Take 1 capsule (12.5 mg total) by mouth daily. 11/25/23   Camnitz, Will Gaylyn Keas, MD  loratadine (CLARITIN) 10 MG tablet Take 10 mg by mouth daily.    [provider]  Misc Natural Products (PUMPKIN SEED OIL) CAPS Take 2 capsules by mouth daily. 1,000 mg per cap    [provider]  PUMPKIN SEED PO Take by mouth.    [provider]  telmisartan  (MICARDIS ) 40 MG tablet Take 1 tablet (40 mg total) by mouth daily. 02/27/23   Camnitz, Babetta Lesch, MD      Allergies    Amoxicillin, Hydrocodone-acetaminophen , Oxycodone, and Amoxicillin-pot clavulanate    Review of Systems   Review of Systems  Gastrointestinal:  Positive for abdominal pain.    Physical Exam Updated Vital Signs BP 126/66   Pulse 70   Temp (!) 97.5 F (36.4 C)   Resp 18   Ht 5\' 9"  (1.753 m)   Wt 90.7  kg   SpO2 99%   BMI 29.53 kg/m  Physical Exam Vitals and nursing note reviewed.  Constitutional:      General: He is not in acute distress.    Appearance: He is well-developed. He is not diaphoretic.  HENT:     Head: Normocephalic and atraumatic.  Eyes:     General: No scleral icterus.    Conjunctiva/sclera: Conjunctivae normal.  Cardiovascular:     Rate and Rhythm: Normal rate and regular rhythm.     Heart sounds: Normal heart sounds.  Pulmonary:     Effort: Pulmonary effort is normal. No respiratory distress.     Breath sounds: Normal breath sounds.  Abdominal:     Palpations: Abdomen is soft.     Tenderness: There is no abdominal tenderness.  Musculoskeletal:      Cervical back: Normal range of motion and neck supple.     Comments: Unilateral leg swelling with pitting edema noticed on the left leg.  No bruising or swelling of the foot noted.  Tender to palpation over the first MTP.  Pain with passive movement of the joint.  Old scarring noted to the lateral calf consistent with old dog bite.  DP/PT pulse 2+  Skin:    General: Skin is warm and dry.  Neurological:     Mental Status: He is alert.  Psychiatric:        Behavior: Behavior normal.     ED Results / Procedures / Treatments   Labs (all labs ordered are listed, but only abnormal results are displayed) Labs Reviewed  CBC  LIPASE, BLOOD  COMPREHENSIVE METABOLIC PANEL WITH GFR  URINALYSIS, ROUTINE W REFLEX MICROSCOPIC    EKG EKG Interpretation Date/Time:  Saturday December 07 2023 10:52:41 EDT Ventricular Rate:  85 PR Interval:  173 QRS Duration:  94 QT Interval:  422 QTC Calculation: 502 R Axis:   22  Text Interpretation: Sinus rhythm Abnormal R-wave progression, early transition Minimal ST depression, anterolateral leads Prolonged QT interval similar to prior Confirmed by Russella Courts ([id number]) on 12/07/2023 11:16:24 AM  Radiology No results found.  Procedures Procedures    Medications Ordered in ED Medications - No data to display  ED Course/ Medical Decision Making/ A&P Clinical Course as of 12/10/23 1041  Sat Dec 07, 2023  1235 Lipase(!): 82 [AH]  1236 Glucose(!): 181 [AH]  1236 CO2(!): 19 [AH]  1236 AST(!): 1,147 [AH]  1236 ALT(!): 1,013 [AH]  1236 Total Bilirubin(!): 2.5 [AH]  1236 Anion gap(!): 18 [AH]  1346 CO2(!): 19 [AH]  1346 Glucose(!): 181 [AH]  1346 Alkaline Phosphatase(!): 183 [AH]  1346 AST(!): 1,147 [AH]  1346 Anion gap(!): 18 [AH]  1513 US  Venous Img Lower Unilateral Left (DVT) [AH]  1513 DG Chest Portable 1 View [AH]  Tue Dec 10, 2023  1032 Lipase(!): 82 [AH]    Clinical Course User Index [AH] Tama Fails, PA-C                                  Medical Decision Making Amount and/or Complexity of Data Reviewed Labs: ordered. Decision-making details documented in ED Course. Radiology: ordered. Decision-making details documented in ED Course.  Risk Prescription drug management. Decision regarding hospitalization.   This patient presents to the ED for concern of epigastric abd pain , this involves an extensive number of treatment options, and is a complaint that carries with it a high risk of complications and morbidity.  Differential diagnosis of epigastric pain includes: Functional or nonulcer dyspepsia (MCC), PUD, GERD, Gastritis, (NSAIDs, alcohol, stress, H. pylori, pernicious anemia), pancreatitis or pancreatic cancer, overeating indigestion (high-fat foods, coffee), drugs (aspirin , antibiotics (eg, macrolides, metronidazole), corticosteroids, digoxin, narcotics, theophylline), gastroparesis, lactose intolerance, malabsorption gastric cancer, parasitic infection, (Giardia, Strongyloides, Ascaris) cholelithiasis, choledocholithiasis, or cholangitis, ACS, pericarditis, pneumonia, abdominal hernia, pregnancy, intestinal ischemia, esophageal rupture, gastric volvulus, hepatitis.   Co morbidities: .  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   Social Determinants of Health:  . SDOH Screenings   Food Insecurity: No Food Insecurity (12/07/2023)  Housing: Low Risk  (12/07/2023)  Transportation Needs: No Transportation Needs (12/07/2023)  Utilities:  Not At Risk (12/07/2023)  Financial Resource Strain: Low Risk  (03/09/2020)   Received from [facility]  Physical Activity: Unknown (06/22/2022)   [facility]  Social Connections: Socially Integrated (12/07/2023)  Stress: No Stress Concern Present (06/22/2022)   [facility]  Tobacco Use: Low Risk  (12/09/2023)     Additional history:  {Additional history obtained from wife   Lab Tests:  I Ordered, and personally interpreted labs.  The pertinent results include:      AST 1,147  ALT 1,013  Alkaline Phosphatase 183  Total Bilirubin 2.5   Lipase                   82     Labs all suggestive of obstructive process Less likely acute hepatitis, tylenol  OD ( pt not taking excessive amount)  Imaging Studies:  I ordered imaging studies including RUQ US / CT abd pelvis, LLE US / Foot xray I independently visualized and interpreted imaging which showed  US - w/out acute finding US  leg- negative Xray foot negative CT scan shows: 1. 6 mm distal CBD mildly obstructing stone (choledocholithiasis).  2. Additional nonobstructing 7.7 mm gallstone in the gallbladder  neck.  3. Trace pericholecystic haziness and slight mucosal enhancement of  the mildly dilated cystic duct and dilated extrahepatic biliary tree  which may be related to the obstructing distal CBD stone but  difficult to exclude associated mild cholangitis/cholecystitis.   I agree with the radiologist interpretation  Cardiac Monitoring/ECG:  .The patient was maintained on a cardiac monitor.  I personally viewed and interpreted the cardiac monitored which showed an underlying rhythm of:   Medicines ordered and prescription drug management:  Patient declined pain or nausea meds  Test Considered:  .MRCP  Critical Interventions:  .  Consultations Obtained: Dr. Rudie Cory who recommends MRCP Dr. Ariel Begun who will admit  Problem List / ED  Course:  .   ICD-10-CM   1. Transaminitis  R74.01       MDM: patient here with epigastric abdominal pain. Work up is significant for likely obstructing gallstone and choledocholithiasis. Patient has declined pain meds and is stable for admission Patients leg work up negative-suspect gout.no evidence of DVT/ osteo/ fracture/cellulitis  Dispostion:  After consideration of the diagnostic results and the patients response to treatment, I feel that the patent would benefit from admission.          Final Clinical Impression(s) / ED Diagnoses Final diagnoses:  None    Rx / DC Orders ED Discharge Orders     None         Tama Fails, PA-C 12/10/23 1041    Teddi Favors, DO 12/19/23 [phone]

## 2023-12-07 NOTE — Assessment & Plan Note (Addendum)
 78 year old male presenting to ED with sudden onset epigastric pain found to have elevated LFTS and imaging on CT showing 6mm distal CBD mildly obstrucing stone and an additional nonobstructing 7.45mm gallstone in the gallbladder neck.  -admit to tele -he is currently asymptomatic -GI consulted and MRCP has been ordered -no signs of cholecystitis, no indication for antibiotics at this time  -clear liquids and NPO at midnight in case of procedure tomorrow  -general surgery will need to be consulted for possible chole  -hold eliquis  for now  -pain control

## 2023-12-07 NOTE — ED Triage Notes (Addendum)
 C/o mid abdominal pain since last night, intermittent nausea. Normal BM today. Recently started lasix  on monday

## 2023-12-07 NOTE — Assessment & Plan Note (Signed)
 Optimize electrolytes Keep on telemetry Avoid qt prolonging drugs  Repeat ekg in AM

## 2023-12-07 NOTE — Assessment & Plan Note (Signed)
 Exam consistent with gout Check uric acid Xray with no acute finding

## 2023-12-07 NOTE — H&P (Signed)
 History and Physical    Patient: Jeffrey Contreras WUJ:[id number] DOB: 05/28/1946 DOA: 12/07/2023 DOS: the patient was seen and examined on 12/07/2023 PCP: Luther Saltness, MD  Patient coming from:  [facility]  - lives with his wife. Ambulates independently    Chief Complaint: epigastric pain   HPI: Jeffrey Contreras is a 78 y.o. male with medical history significant of atrial fibrillation on eliquis , BPPV, HTN who presented to ED with complaints of abdominal pain.  He was getting ready to go to bed and he started to have pain in his epigastric area. He got in bed and laid down and the pain became 8/10 and was dull, but very painful. He went to living room and sat in a chair all night, but pain never let up. This morning since pain no better they went to ED. He has had no N/V/D. No fever/chills. He had some GERD type symptoms. He states as the day has gone on, pain has completely subsided.    Denies any fever/chills, vision changes/headaches, chest pain or palpitations, shortness of breath or cough, abdominal pain, N/V/D, dysuria or leg swelling.   He is on eliquis  and last took at 9AM this morning.   He does not smoke and does not drink alcohol.   ER Course:  vitals: afebrile, bp: 128/71, HR: 84, RR: 19, oxygen: 98% RA Pertinent labs: AST: 1147, ALT: 1013, alk phos: 183, t.bili: 2.5, AG: 18, lipase: 82,  Cxr: no acute findings Left foot xray: no acute findings. Mild dorsal spurring in the midfoot  Left LE DVT: no evidence of DVT  US  RUQ: Mild gallbladder distension with sludge and subcentimeter cholelithiasis. Thickened gallbladder wall. Negative Murphys sign. Appearance remains nonspecific. 2. Nonvisualized common bile duct. CT abdomen/pelvis: 6 mm distal CBD mildly obstructing stone (choledocholithiasis). 2. Additional nonobstructing 7.7 mm gallstone in the gallbladder neck. 3. Trace pericholecystic haziness and slight mucosal enhancement of the mildly dilated cystic duct and dilated  extrahepatic biliary tree which may be related to the obstructing distal CBD stone but difficult to exclude associated mild cholangitis/cholecystitis. 4. Minor colonic diverticulosis without acute inflammatory process. In ED: GI consulted. TRH asked to admit.     Review of Systems: As mentioned in the history of present illness. All other systems reviewed and are negative. Past Medical History:  Diagnosis Date   Atrial fibrillation (HCC) 09/2019   Atrial fibrillation with RVR (HCC) 09/16/2019   Benign paroxysmal positional vertigo of left ear 01/21/2018   Hallpike positive left ear down for rotational nystagmus.  Formatting of this note might be different from the original. Hallpike positive left ear down for rotational nystagmus.   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 Continues with fullness in the ears with intermittent popping.  Causes intermittent unsteadiness.  Presents to discuss ventilation tube placement. EXAMINATION reveals normal external canals and tympanic membranes. PLAN: I am not sure if this is more an inner ear or middle ear problem.  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   Renal insufficiency 09/16/2019   Past Surgical History:  Procedure Laterality Date   ATRIAL FIBRILLATION ABLATION N/A 03/07/2022   Procedure:  ATRIAL FIBRILLATION ABLATION;  Surgeon: Lei Pump, MD;  Location: [facility] INVASIVE CV [facility];  Service: Cardiovascular;  Laterality: N/A;   GLAUCOMA SURGERY     MANDIBLE FRACTURE SURGERY     TONSILLECTOMY     Social History:  reports that he has never smoked. He has never used  smokeless tobacco. He reports current alcohol use. He reports that he does not use drugs.  Allergies  Allergen Reactions   Amoxicillin Rash   Hydrocodone-Acetaminophen  Rash and Other (See Comments)    Pt states when taking this med, he feels like he is floating.     Oxycodone Nausea Only   Amoxicillin-Pot Clavulanate Rash    Other reaction(s): rash    Family History  Problem Relation Age of Onset   Asthma Father    Heart attack Father    Glaucoma Father     Prior to Admission medications   Medication Sig Start Date End Date Taking? Authorizing Provider  apixaban  (ELIQUIS ) 5 MG TABS tablet Take 1 tablet by mouth twice daily 11/11/23   Camnitz, Babetta Lesch, MD  atorvastatin  (LIPITOR) 40 MG tablet Take 1 tablet (40 mg total) by mouth daily. 02/27/23   Camnitz, Babetta Lesch, MD  brimonidine -timolol  (COMBIGAN ) 0.2-0.5 % ophthalmic solution Place 1 drop into both eyes 2 (two) times daily. 11/11/19   [provider]  budesonide  (RHINOCORT  AQUA) 32 MCG/ACT nasal spray Place 1 spray into both nostrils daily. 01/28/23   Crain, Whitney L, PA  diltiazem  (CARDIZEM ) 30 MG tablet Take 1 tablet (30 mg total) by mouth every 4 (four) hours as needed (heart rate greater than 120 bpm or in afib). 09/04/22   Hassan Links, MD  dorzolamide  (TRUSOPT ) 2 % ophthalmic solution Place 1 drop into the left eye 3 (three) times daily.    [provider]  eplerenone  (INSPRA ) 25 MG tablet Take 1 tablet by mouth once daily 02/27/23   Camnitz, Babetta Lesch, MD  fexofenadine (ALLEGRA) 180 MG tablet Take 180 mg by mouth daily.    [provider]  fluticasone (FLONASE) 50 MCG/ACT nasal spray Place 1 spray into both nostrils daily.    [provider]  furosemide  (LASIX ) 20 MG tablet Take 1 tablet (20 mg total) by mouth daily. 11/25/23   Camnitz, Babetta Lesch, MD  hydrochlorothiazide  (MICROZIDE ) 12.5 MG capsule Take 1 capsule (12.5 mg total) by mouth daily. 11/25/23   Camnitz, Will Gaylyn Keas, MD   loratadine (CLARITIN) 10 MG tablet Take 10 mg by mouth daily.    [provider]  Misc Natural Products (PUMPKIN SEED OIL) CAPS Take 2 capsules by mouth daily. 1,000 mg per cap    [provider]  PUMPKIN SEED PO Take by mouth.    [provider]  telmisartan  (MICARDIS ) 40 MG tablet Take 1 tablet (40 mg total) by mouth daily. 02/27/23   Lei Pump, MD    Physical Exam: Vitals:   12/07/23 1610 12/07/23 1730 12/07/23 1731 12/07/23 1824  BP:   137/67 136/72  Pulse:   66 (!) 59  Resp:   17 18  Temp: 97.8 F (36.6 C) 98.2 F (36.8 C) 98.2 F (36.8 C) 98.1 F (36.7 C)  TempSrc: Oral Oral Oral Oral  SpO2:   99% 100%  Weight:    88.4 kg  Height:    5\' 9"  (1.753 m)   General:  Appears calm and comfortable and is in NAD Eyes:  PERRL, EOMI, normal lids, iris ENT:  grossly normal hearing, lips &  tongue, mmm; appropriate dentition Neck:  no LAD, masses or thyromegaly; no carotid bruits Cardiovascular:  RRR, no m/r/g. +LLE edema  Respiratory:   CTA bilaterally with no wheezes/rales/rhonchi.  Normal respiratory effort. Abdomen:  soft, NT, ND, NABS Back:   normal alignment, no CVAT Skin:  no rash or induration seen on limited exam Musculoskeletal:  grossly normal tone BUE/BLE, good ROM, pain and swelling in left great toe.  Lower extremity:  +LE edema of LLE.   Limited foot exam with no ulcerations.  2+ distal pulses. Psychiatric:  grossly normal mood and affect, speech fluent and appropriate, AOx3 Neurologic:  CN 2-12 grossly intact, moves all extremities in coordinated fashion, sensation intact   Radiological Exams on Admission: Independently reviewed - see discussion in A/P where applicable  CT ABDOMEN PELVIS W CONTRAST Result Date: 12/07/2023 CLINICAL DATA:  Epigastric and mid abdominal pain with intermittent nausea EXAM: CT ABDOMEN AND PELVIS WITH CONTRAST TECHNIQUE: Multidetector CT imaging of the abdomen and pelvis was performed using the standard  protocol following bolus administration of intravenous contrast. RADIATION DOSE REDUCTION: This exam was performed according to the departmental dose-optimization program which includes automated exposure control, adjustment of the mA and/or kV according to patient size and/or use of iterative reconstruction technique. CONTRAST:  OMNIPAQUE  IOHEXOL  300 MG/ML  SOLN COMPARISON:  12/07/2023 FINDINGS: Lower chest: Scattered bibasilar subsegmental atelectasis. Normal heart size. No pericardial or pleural effusion. Hepatobiliary: No focal hepatic abnormality or intrahepatic biliary dilatation pattern. Normal hepatic contour. Hepatic and portal veins are patent. Mild gallbladder distension including the gallbladder neck, cystic duct, common hepatic duct, and common bile duct. This appears secondary to distal CBD mildly obstructing stone measuring 6 mm on coronal image 83/7 (choledocholithiasis). Additional nonobstructing gallstone in the gallbladder neck measures 7.7 mm, coronal image 88/7. Trace pericholecystic haziness and slight mucosal enhancement of the cystic duct and mildly dilated extrahepatic biliary tree which may be related to the bstructing distal CBD stone but difficult to exclude associated mild cholangitis/cholecystitis. Pancreas: Unremarkable. No pancreatic ductal dilatation or surrounding inflammatory changes. Spleen: Normal in size without focal abnormality. Adrenals/Urinary Tract: Adrenal glands are unremarkable. Kidneys are normal, without renal calculi, focal lesion, or hydronephrosis. Bladder is unremarkable. Stomach/Bowel: Negative for bowel obstruction, significant dilatation, ileus, or free air. Appendix not visualized. Minor scattered colonic diverticulosis without acute inflammatory process. No free fluid, fluid collection, hemorrhage, hematoma, abscess or ascites. Vascular/Lymphatic: Minor aortoiliac atherosclerosis and tortuosity. No acute vascular process. No veno-occlusive finding. No  adenopathy. Reproductive: Prostate enlargement with coarse calcifications. Other: No abdominal wall hernia or abnormality. No abdominopelvic ascites. Musculoskeletal: Degenerative changes throughout the spine with mild associated dextroscoliosis. No acute osseous finding. IMPRESSION: 1. 6 mm distal CBD mildly obstructing stone (choledocholithiasis). 2. Additional nonobstructing 7.7 mm gallstone in the gallbladder neck. 3. Trace pericholecystic haziness and slight mucosal enhancement of the mildly dilated cystic duct and dilated extrahepatic biliary tree which may be related to the obstructing distal CBD stone but difficult to exclude associated mild cholangitis/cholecystitis. 4. Minor colonic diverticulosis without acute inflammatory process. Aortic Atherosclerosis (ICD10-I70.0). Electronically Signed   By: Melven Stable.  Shick M.D.   On: 12/07/2023 15:01   US  ABDOMEN LIMITED RUQ (LIVER/GB) Result Date: 12/07/2023 CLINICAL DATA:  [id number] Transaminitis [id number], mid abdominal pain EXAM: ULTRASOUND ABDOMEN LIMITED RIGHT UPPER QUADRANT COMPARISON:  None Available. FINDINGS: Gallbladder: Mild gallbladder distension with a thickened wall measuring 7 mm. Hypoechoic intraluminal sludge in the gallbladder body and neck. Subcentimeter gallstones suspected in the gallbladder neck measuring up to 8 mm.  No pericholecystic fluid. No Murphy's sign elicited during the exam. Common bile duct: Diameter: Unable to visualize accurately because of bowel gas and high position of the liver. Liver: Mild increased echogenicity, nonspecific. No large focal hepatic abnormality. No intrahepatic biliary dilatation. Normal liver contour. No surrounding free fluid or ascites. Portal vein is patent on color Doppler imaging with normal direction of blood flow towards the liver. Other: No free fluid. IMPRESSION: 1. Mild gallbladder distension with sludge and subcentimeter cholelithiasis. Thickened gallbladder wall. Negative Murphys sign. Appearance remains  nonspecific. 2. Nonvisualized common bile duct. Electronically Signed   By: Melven Stable.  Shick M.D.   On: 12/07/2023 14:40   DG Foot Complete Left Result Date: 12/07/2023 CLINICAL DATA:  leg swelling EXAM: LEFT FOOT - COMPLETE 3+ VIEW COMPARISON:  None Available. FINDINGS: There is no evidence of fracture or dislocation. Mild dorsal spurring in the midfoot. There is no evidence of arthropathy or other focal bone abnormality. Calcaneal spur. Soft tissues are unremarkable. IMPRESSION: 1. No acute findings. 2. Mild dorsal spurring in the midfoot. Electronically Signed   By: Nicoletta Barrier M.D.   On: 12/07/2023 12:27   DG Chest Portable 1 View Result Date: 12/07/2023 CLINICAL DATA:  Epigastric pain EXAM: PORTABLE CHEST - 1 VIEW COMPARISON:  05/25/2021 FINDINGS: Low lung volumes with linear atelectasis in both lung bases. No overt edema. No pneumothorax. Heart size and mediastinal contours are within normal limits. No effusion. Advanced left glenohumeral DJD. IMPRESSION: Low volumes with bibasilar atelectasis. Electronically Signed   By: Nicoletta Barrier M.D.   On: 12/07/2023 12:26   US  Venous Img Lower Unilateral Left (DVT) Result Date: 12/07/2023 CLINICAL DATA:  Calf swelling. 2-3 weeks. On Eliquis  for atrial fibrillation EXAM: Left LOWER EXTREMITY VENOUS DOPPLER ULTRASOUND TECHNIQUE: Gray-scale sonography with graded compression, as well as color Doppler and duplex ultrasound were performed to evaluate the lower extremity deep venous systems from the level of the common femoral vein and including the common femoral, femoral, profunda femoral, popliteal and calf veins including the posterior tibial, peroneal and gastrocnemius veins when visible. The superficial great saphenous vein was also interrogated. Spectral Doppler was utilized to evaluate flow at rest and with distal augmentation maneuvers in the common femoral, femoral and popliteal veins. COMPARISON:  None Available. FINDINGS: Contralateral Common Femoral Vein:  Respiratory phasicity is normal and symmetric with the symptomatic side. No evidence of thrombus. Normal compressibility. Common Femoral Vein: No evidence of thrombus. Normal compressibility, respiratory phasicity and response to augmentation. Saphenofemoral Junction: No evidence of thrombus. Normal compressibility and flow on color Doppler imaging. Profunda Femoral Vein: No evidence of thrombus. Normal compressibility and flow on color Doppler imaging. Femoral Vein: No evidence of thrombus. Normal compressibility, respiratory phasicity and response to augmentation. Popliteal Vein: No evidence of thrombus. Normal compressibility, respiratory phasicity and response to augmentation. Calf Veins: No evidence of thrombus. Normal compressibility and flow on color Doppler imaging. Superficial Great Saphenous Vein: No evidence of thrombus. Normal compressibility. Venous Reflux:  None. Other Findings:  None. IMPRESSION: No evidence of left lower extremity DVT. Electronically Signed   By: Adrianna Horde M.D.   On: 12/07/2023 12:18    EKG: Independently reviewed.  NSR with rate 85; nonspecific ST changes with no evidence of acute ischemia. Prolonged QT    Labs on Admission: I have personally reviewed the available labs and imaging studies at the time of the admission.  Pertinent labs:   AST: 1147,  ALT: 1013,  alk phos: 183,  t.bili: 2.5,  AG: 18,  lipase: 82,  Assessment and Plan: Principal Problem:   Choledocholithiasis with obstruction and transaminitis Active Problems:   Great toe pain, left   QT prolongation   Paroxysmal atrial fibrillation (HCC)   Essential hypertension   Hyperlipidemia    Assessment and Plan: * Choledocholithiasis with obstruction and transaminitis 78 year old male presenting to ED with sudden onset epigastric pain found to have elevated LFTS and imaging on CT showing 6mm distal CBD mildly obstrucing stone and an additional nonobstructing 7.71mm gallstone in the gallbladder  neck.  -admit to tele -he is currently asymptomatic -GI consulted and MRCP has been ordered -no signs of cholecystitis, no indication for antibiotics at this time  -clear liquids and NPO at midnight in case of procedure tomorrow  -general surgery will need to be consulted for possible chole  -hold eliquis  for now  -pain control  Great toe pain, left Exam consistent with gout Check uric acid Xray with no acute finding    QT prolongation Optimize electrolytes Keep on telemetry Avoid qt prolonging drugs  Repeat ekg in AM    Paroxysmal atrial fibrillation (HCC) NSR Hold eliquis  with possible surgery   Essential hypertension Well controlled Continue home hydrochlorothiazide , avapro and inspra    Hyperlipidemia Hold lipitor with transaminitis     Advance Care Planning:   Code Status: Full Code   Consults: GI: Dr. Veronda Goody  DVT Prophylaxis: eliquis  (on hold)/SCDs   Family Communication: wife at bedside   Severity of Illness: The appropriate patient status for this patient is INPATIENT. Inpatient status is judged to be reasonable and necessary in order to provide the required intensity of service to ensure the patient's safety. The patient's presenting symptoms, physical exam findings, and initial radiographic and laboratory data in the context of their chronic comorbidities is felt to place them at high risk for further clinical deterioration. Furthermore, it is not anticipated that the patient will be medically stable for discharge from the hospital within 2 midnights of admission.   * I certify that at the point of admission it is my clinical judgment that the patient will require inpatient hospital care spanning beyond 2 midnights from the point of admission due to high intensity of service, high risk for further deterioration and high frequency of surveillance required.*  Author: Raymona Caldwell, MD 12/07/2023 8:22 PM  For on call review [email or web address].

## 2023-12-07 NOTE — ED Notes (Signed)
 [facility] called for transport , No Current ETA. ED Nurse has paper work Called @ 15:55

## 2023-12-07 NOTE — Assessment & Plan Note (Signed)
 Well controlled Continue home hydrochlorothiazide , avapro and inspra 

## 2023-12-07 NOTE — ED Notes (Signed)
 [facility] truck called, will arrive within 5 mins to transport patient to [facility], ED Nurse has been made aware. Received call @ 16:25

## 2023-12-08 ENCOUNTER — Inpatient Hospital Stay (HOSPITAL_COMMUNITY)

## 2023-12-08 DIAGNOSIS — K8051 Calculus of bile duct without cholangitis or cholecystitis with obstruction: Secondary | ICD-10-CM | POA: Diagnosis not present

## 2023-12-08 LAB — CBC
HCT: 42.1 % (ref 39.0–52.0)
Hemoglobin: 14.2 g/dL (ref 13.0–17.0)
MCH: 31.7 pg (ref 26.0–34.0)
MCHC: 33.7 g/dL (ref 30.0–36.0)
MCV: 94 fL (ref 80.0–100.0)
Platelets: 222 10*3/uL (ref 150–400)
RBC: 4.48 MIL/uL (ref 4.22–5.81)
RDW: 13.3 % (ref 11.5–15.5)
WBC: 6.3 10*3/uL (ref 4.0–10.5)
nRBC: 0 % (ref 0.0–0.2)

## 2023-12-08 LAB — COMPREHENSIVE METABOLIC PANEL WITH GFR
ALT: 925 U/L — ABNORMAL HIGH (ref 0–44)
AST: 592 U/L — ABNORMAL HIGH (ref 15–41)
Albumin: 3.3 g/dL — ABNORMAL LOW (ref 3.5–5.0)
Alkaline Phosphatase: 186 U/L — ABNORMAL HIGH (ref 38–126)
Anion gap: 10 (ref 5–15)
BUN: 22 mg/dL (ref 8–23)
CO2: 22 mmol/L (ref 22–32)
Calcium: 8.8 mg/dL — ABNORMAL LOW (ref 8.9–10.3)
Chloride: 106 mmol/L (ref 98–111)
Creatinine, Ser: 1.03 mg/dL (ref 0.61–1.24)
GFR, Estimated: >60 mL/min (ref >60)
Glucose, Bld: 112 mg/dL — ABNORMAL HIGH (ref 70–99)
Potassium: 3.6 mmol/L (ref 3.5–5.1)
Sodium: 138 mmol/L (ref 135–145)
Total Bilirubin: 4.1 mg/dL — ABNORMAL HIGH (ref 0.0–1.2)
Total Protein: 5.8 g/dL — ABNORMAL LOW (ref 6.5–8.1)

## 2023-12-08 LAB — PROTIME-INR
INR: 1.2 (ref 0.8–1.2)
Prothrombin Time: 15.1 s (ref 11.4–15.2)

## 2023-12-08 LAB — LIPASE, BLOOD: Lipase: 55 U/L — ABNORMAL HIGH (ref 11–51)

## 2023-12-08 LAB — URIC ACID: Uric Acid, Serum: 8.6 mg/dL (ref 3.7–8.6)

## 2023-12-08 LAB — HEPATITIS PANEL, ACUTE
HCV Ab: NONREACTIVE
Hep A IgM: NONREACTIVE
Hep B C IgM: NONREACTIVE
Hepatitis B Surface Ag: NONREACTIVE

## 2023-12-08 MED ORDER — GADOBUTROL 1 MMOL/ML IV SOLN
10.0000 mL | Freq: Once | INTRAVENOUS | Status: AC | PRN
  Administered 2023-12-08: 10 mL via INTRAVENOUS

## 2023-12-08 MED ORDER — SODIUM CHLORIDE 0.9 % IV SOLN
2.0000 g | Freq: Once | INTRAVENOUS | Status: AC
  Administered 2023-12-09: 2 g via INTRAVENOUS
  Filled 2023-12-08 (×2): qty 20

## 2023-12-08 NOTE — Progress Notes (Signed)
 PROGRESS NOTE  Jeffrey Contreras ZOX:[id number] DOB: September 02, 1945 DOA: 12/07/2023 PCP: Luther Saltness, MD   LOS: 1 day   Brief Narrative / Interim history: 78 year old male with history of PAF on Eliquis , BPPV, HTN who comes into the hospital with abdominal pain.  He experienced this pain mainly in the epigastric area, dull, and given persistence throughout the night decided come to the emergency room.  No nausea, vomiting, diarrhea, no fever or chills.  In the ED he was found to have significantly elevated LFTs as well as choledocholithiasis and cholelithiasis.  Subjective / 24h Interval events: Subjectively he is feeling better this morning, abdominal pain is improved, no nausea or vomiting  Assesement and Plan: Principal Problem:   Choledocholithiasis with obstruction and transaminitis Active Problems:   Great toe pain, left   QT prolongation   Paroxysmal atrial fibrillation (HCC)   Essential hypertension   Hyperlipidemia  Principal problem Choledocholithiasis, cholelithiasis -based on initial imaging as well as MRI/MRCP.  Gastroenterology as well as general surgery consulted.  He is on anticoagulation at home, but has not taken in the last 24 hours.  Will continue to allow washout, he will undergo ERCP tomorrow and potentially followed by cholecystectomy per general surgery  Active problems PAF-he is in sinus right now, continue to hold anticoagulation for future ERCP and potential cholecystectomy  Essential hypertension-continue HCTZ, spironolactone , ARB  Hyperlipidemia-called statin with elevated LFTs  Elevated LFTs-due to #1  Scheduled Meds:  brimonidine   1 drop Both Eyes BID   And   timolol   1 drop Both Eyes BID   dorzolamide   1 drop Left Eye TID   hydrochlorothiazide   12.5 mg Oral Daily   irbesartan  75 mg Oral Daily   spironolactone   25 mg Oral Daily   Continuous Infusions:  [START ON 12/09/2023] cefTRIAXone (ROCEPHIN)  IV     PRN Meds:.morphine injection,  traMADol  Current Outpatient Medications  Medication Instructions   atorvastatin  (LIPITOR) 40 mg, Oral, Daily   benzonatate  (TESSALON ) 100 mg, Oral, 3 times daily PRN   brimonidine -timolol  (COMBIGAN ) 0.2-0.5 % ophthalmic solution 1 drop, 2 times daily   diltiazem  (CARDIZEM ) 30 mg, Oral, Every 4 hours PRN   dorzolamide  (TRUSOPT ) 2 % ophthalmic solution 1 drop, 3 times daily   Eliquis  5 mg, Oral, 2 times daily   eplerenone  (INSPRA ) 25 mg, Oral, Daily   fexofenadine (ALLEGRA) 180 mg, Oral, Daily   fluticasone (FLONASE) 50 MCG/ACT nasal spray 1 spray, Each Nare, Daily   furosemide  (LASIX ) 20 mg, Oral, Daily   hydrochlorothiazide  (MICROZIDE ) 12.5 mg, Oral, Daily   Misc Natural Products (PUMPKIN SEED OIL) CAPS 1 capsule, 2 times daily   telmisartan  (MICARDIS ) 40 mg, Oral, Daily    Diet Orders (From admission, onward)     Start     Ordered   12/09/23 0001  Diet NPO time specified  Diet effective midnight        12/08/23 0928   12/08/23 0928  Diet full liquid Fluid consistency: Thin  Diet effective now       Question:  Fluid consistency:  Answer:  Thin   12/08/23 0928            DVT prophylaxis: Place TED hose Start: 12/07/23 2003   Lab Results  Component Value Date   PLT 222 12/08/2023      Code Status: Full Code  Family Communication: Wife at bedside  Status is: Inpatient Remains inpatient appropriate because: Severity of illness   Level of care: Telemetry  Consultants:  Gastroenterology General Surgery  Objective: Vitals:   12/07/23 1824 12/07/23 2121 12/08/23 0227 12/08/23 0625  BP: 136/72 (!) 122/51 131/71 123/73  Pulse: (!) 59 64 62 66  Resp: 18 18 18 18   Temp: 98.1 F (36.7 C) 98.2 F (36.8 C) 98.6 F (37 C) 98.6 F (37 C)  TempSrc: Oral Oral Oral Oral  SpO2: 100% 100% 99% 98%  Weight: 88.4 kg     Height: 5\' 9"  (1.753 m)       Intake/Output Summary (Last 24 hours) at 12/08/2023 1024 Last data filed at 12/08/2023 0700 Gross per 24 hour  Intake 0  ml  Output --  Net 0 ml   Wt Readings from Last 3 Encounters:  12/07/23 88.4 kg  11/25/23 91.6 kg  05/16/23 87.1 kg    Examination:  Constitutional: NAD Eyes: faint scleral icterus ENMT: Mucous membranes are moist.  Neck: normal, supple Respiratory: clear to auscultation bilaterally, no wheezing, no crackles.  Cardiovascular: Regular rate and rhythm, no murmurs / rubs / gallops. No LE edema.  Abdomen: non distended, no tenderness. Bowel sounds positive.  Musculoskeletal: no clubbing / cyanosis.    Data Reviewed: I have independently reviewed following labs and imaging studies   CBC Recent Labs  Lab 12/07/23 1054 12/08/23 0820  WBC 6.2 6.3  HGB 15.4 14.2  HCT 45.5 42.1  PLT 274 222  MCV 91.2 94.0  MCH 30.9 31.7  MCHC 33.8 33.7  RDW 13.2 13.3    Recent Labs  Lab 12/07/23 1054 12/07/23 1336 12/07/23 2048 12/08/23 0820  NA 138  --   --  138  K 3.6  --   --  3.6  CL 101  --   --  106  CO2 19*  --   --  22  GLUCOSE 181*  --   --  112*  BUN 24*  --   --  22  CREATININE 1.13  --   --  1.03  CALCIUM  9.7  --   --  8.8*  AST 1,147*  --   --  592*  ALT 1,013*  --   --  925*  ALKPHOS 183*  --   --  186*  BILITOT 2.5*  --   --  4.1*  ALBUMIN 4.1  --   --  3.3*  MG  --   --  2.1  --   INR  --  1.2  --  1.2    ------------------------------------------------------------------------------------------------------------------ No results for input(s): "CHOL", "HDL", "LDLCALC", "TRIG", "CHOLHDL", "LDLDIRECT" in the last 72 hours.  Lab Results  Component Value Date   HGBA1C 5.5 09/16/2019   ------------------------------------------------------------------------------------------------------------------ No results for input(s): "TSH", "T4TOTAL", "T3FREE", "THYROIDAB" in the last 72 hours.  Invalid input(s): "FREET3"  Cardiac Enzymes No results for input(s): "CKMB", "TROPONINI", "MYOGLOBIN" in the last 168 hours.  Invalid input(s):  "CK" ------------------------------------------------------------------------------------------------------------------    Component Value Date/Time   BNP 168.6 (H) 09/15/2019 2308    CBG: No results for input(s): "GLUCAP" in the last 168 hours.  No results found for this or any previous visit (from the past 240 hours).   Radiology Studies: MR ABDOMEN MRCP W WO CONTAST Result Date: 12/08/2023 CLINICAL DATA:  Evaluate for biliary obstruction.  Gallstones. EXAM: MRI ABDOMEN WITHOUT AND WITH CONTRAST (INCLUDING MRCP) TECHNIQUE: Multiplanar multisequence MR imaging of the abdomen was performed both before and after the administration of intravenous contrast. Heavily T2-weighted images of the biliary and pancreatic ducts were obtained, and three-dimensional MRCP images were rendered by  post processing. CONTRAST:  10mL GADAVIST GADOBUTROL 1 MMOL/ML IV SOLN COMPARISON:  CT 12/07/2023 and U/S abdomen limited 12/07/2023. FINDINGS: Lower chest: No acute findings. Hepatobiliary: No mass or other parenchymal abnormality identified. Gallbladder sludge identified. Stone within the gallbladder neck is again seen measuring 6 mm. Gallbladder wall thickness measures 3 mm. No significant pericholecystic fluid. The common bile duct measures 7 mm. Again seen is a small stone within the distal common bile duct, which measured 6 mm on the CT from prior day, image 10 of series 9. No significant intrahepatic bile duct dilatation. Pancreas: No mass, inflammatory changes, or other parenchymal abnormality identified. Spleen:  Within normal limits in size and appearance. Adrenals/Urinary Tract: No masses identified. No evidence of hydronephrosis. Stomach/Bowel: Visualized portions within the abdomen are unremarkable. Vascular/Lymphatic: No pathologically enlarged lymph nodes identified. No abdominal aortic aneurysm demonstrated. Other:  No significant free fluid or fluid collections. Musculoskeletal: No suspicious bone lesions  identified. IMPRESSION: 1. Choledocholithiasis with small stone within the distal common bile duct. The common bile duct measures 7 mm. No significant intrahepatic bile duct dilatation. 2. Gallbladder sludge and stone within the gallbladder neck. Gallbladder wall thickness measures 3 mm. No significant pericholecystic fluid. Electronically Signed   By: Kimberley Penman M.D.   On: 12/08/2023 08:41   CT ABDOMEN PELVIS W CONTRAST Result Date: 12/07/2023 CLINICAL DATA:  Epigastric and mid abdominal pain with intermittent nausea EXAM: CT ABDOMEN AND PELVIS WITH CONTRAST TECHNIQUE: Multidetector CT imaging of the abdomen and pelvis was performed using the standard protocol following bolus administration of intravenous contrast. RADIATION DOSE REDUCTION: This exam was performed according to the departmental dose-optimization program which includes automated exposure control, adjustment of the mA and/or kV according to patient size and/or use of iterative reconstruction technique. CONTRAST:  OMNIPAQUE  IOHEXOL  300 MG/ML  SOLN COMPARISON:  12/07/2023 FINDINGS: Lower chest: Scattered bibasilar subsegmental atelectasis. Normal heart size. No pericardial or pleural effusion. Hepatobiliary: No focal hepatic abnormality or intrahepatic biliary dilatation pattern. Normal hepatic contour. Hepatic and portal veins are patent. Mild gallbladder distension including the gallbladder neck, cystic duct, common hepatic duct, and common bile duct. This appears secondary to distal CBD mildly obstructing stone measuring 6 mm on coronal image 83/7 (choledocholithiasis). Additional nonobstructing gallstone in the gallbladder neck measures 7.7 mm, coronal image 88/7. Trace pericholecystic haziness and slight mucosal enhancement of the cystic duct and mildly dilated extrahepatic biliary tree which may be related to the bstructing distal CBD stone but difficult to exclude associated mild cholangitis/cholecystitis. Pancreas: Unremarkable. No  pancreatic ductal dilatation or surrounding inflammatory changes. Spleen: Normal in size without focal abnormality. Adrenals/Urinary Tract: Adrenal glands are unremarkable. Kidneys are normal, without renal calculi, focal lesion, or hydronephrosis. Bladder is unremarkable. Stomach/Bowel: Negative for bowel obstruction, significant dilatation, ileus, or free air. Appendix not visualized. Minor scattered colonic diverticulosis without acute inflammatory process. No free fluid, fluid collection, hemorrhage, hematoma, abscess or ascites. Vascular/Lymphatic: Minor aortoiliac atherosclerosis and tortuosity. No acute vascular process. No veno-occlusive finding. No adenopathy. Reproductive: Prostate enlargement with coarse calcifications. Other: No abdominal wall hernia or abnormality. No abdominopelvic ascites. Musculoskeletal: Degenerative changes throughout the spine with mild associated dextroscoliosis. No acute osseous finding. IMPRESSION: 1. 6 mm distal CBD mildly obstructing stone (choledocholithiasis). 2. Additional nonobstructing 7.7 mm gallstone in the gallbladder neck. 3. Trace pericholecystic haziness and slight mucosal enhancement of the mildly dilated cystic duct and dilated extrahepatic biliary tree which may be related to the obstructing distal CBD stone but difficult to exclude associated mild cholangitis/cholecystitis. 4.  Minor colonic diverticulosis without acute inflammatory process. Aortic Atherosclerosis (ICD10-I70.0). Electronically Signed   By: Melven Stable.  Shick M.D.   On: 12/07/2023 15:01   US  ABDOMEN LIMITED RUQ (LIVER/GB) Result Date: 12/07/2023 CLINICAL DATA:  [id number] Transaminitis [id number], mid abdominal pain EXAM: ULTRASOUND ABDOMEN LIMITED RIGHT UPPER QUADRANT COMPARISON:  None Available. FINDINGS: Gallbladder: Mild gallbladder distension with a thickened wall measuring 7 mm. Hypoechoic intraluminal sludge in the gallbladder body and neck. Subcentimeter gallstones suspected in the gallbladder neck  measuring up to 8 mm. No pericholecystic fluid. No Murphy's sign elicited during the exam. Common bile duct: Diameter: Unable to visualize accurately because of bowel gas and high position of the liver. Liver: Mild increased echogenicity, nonspecific. No large focal hepatic abnormality. No intrahepatic biliary dilatation. Normal liver contour. No surrounding free fluid or ascites. Portal vein is patent on color Doppler imaging with normal direction of blood flow towards the liver. Other: No free fluid. IMPRESSION: 1. Mild gallbladder distension with sludge and subcentimeter cholelithiasis. Thickened gallbladder wall. Negative Murphys sign. Appearance remains nonspecific. 2. Nonvisualized common bile duct. Electronically Signed   By: Melven Stable.  Shick M.D.   On: 12/07/2023 14:40   DG Foot Complete Left Result Date: 12/07/2023 CLINICAL DATA:  leg swelling EXAM: LEFT FOOT - COMPLETE 3+ VIEW COMPARISON:  None Available. FINDINGS: There is no evidence of fracture or dislocation. Mild dorsal spurring in the midfoot. There is no evidence of arthropathy or other focal bone abnormality. Calcaneal spur. Soft tissues are unremarkable. IMPRESSION: 1. No acute findings. 2. Mild dorsal spurring in the midfoot. Electronically Signed   By: Nicoletta Barrier M.D.   On: 12/07/2023 12:27   DG Chest Portable 1 View Result Date: 12/07/2023 CLINICAL DATA:  Epigastric pain EXAM: PORTABLE CHEST - 1 VIEW COMPARISON:  05/25/2021 FINDINGS: Low lung volumes with linear atelectasis in both lung bases. No overt edema. No pneumothorax. Heart size and mediastinal contours are within normal limits. No effusion. Advanced left glenohumeral DJD. IMPRESSION: Low volumes with bibasilar atelectasis. Electronically Signed   By: Nicoletta Barrier M.D.   On: 12/07/2023 12:26   US  Venous Img Lower Unilateral Left (DVT) Result Date: 12/07/2023 CLINICAL DATA:  Calf swelling. 2-3 weeks. On Eliquis  for atrial fibrillation EXAM: Left LOWER EXTREMITY VENOUS DOPPLER ULTRASOUND  TECHNIQUE: Gray-scale sonography with graded compression, as well as color Doppler and duplex ultrasound were performed to evaluate the lower extremity deep venous systems from the level of the common femoral vein and including the common femoral, femoral, profunda femoral, popliteal and calf veins including the posterior tibial, peroneal and gastrocnemius veins when visible. The superficial great saphenous vein was also interrogated. Spectral Doppler was utilized to evaluate flow at rest and with distal augmentation maneuvers in the common femoral, femoral and popliteal veins. COMPARISON:  None Available. FINDINGS: Contralateral Common Femoral Vein: Respiratory phasicity is normal and symmetric with the symptomatic side. No evidence of thrombus. Normal compressibility. Common Femoral Vein: No evidence of thrombus. Normal compressibility, respiratory phasicity and response to augmentation. Saphenofemoral Junction: No evidence of thrombus. Normal compressibility and flow on color Doppler imaging. Profunda Femoral Vein: No evidence of thrombus. Normal compressibility and flow on color Doppler imaging. Femoral Vein: No evidence of thrombus. Normal compressibility, respiratory phasicity and response to augmentation. Popliteal Vein: No evidence of thrombus. Normal compressibility, respiratory phasicity and response to augmentation. Calf Veins: No evidence of thrombus. Normal compressibility and flow on color Doppler imaging. Superficial Great Saphenous Vein: No evidence of thrombus. Normal compressibility. Venous Reflux:  None. Other Findings:  None. IMPRESSION: No evidence of left lower extremity DVT. Electronically Signed   By: Adrianna Horde M.D.   On: 12/07/2023 12:18     Kathlen Para, MD, PhD [facility]  Between 7 am - 7 pm I am available, please contact me via Amion (for emergencies) or Securechat (non urgent messages)  Between 7 pm - 7 am I am not available, please contact night coverage MD/APP  via Amion

## 2023-12-08 NOTE — H&P (View-Only) (Signed)
 Referring Provider: [facility] Primary Care Physician:  Luther Saltness, MD Primary Gastroenterologist: Unassigned/[facility]  Reason for Consultation: Abdominal pain, abnormal LFTs  HPI: Jeffrey Contreras is a 78 y.o. male with past medical history of atrial fibrillation on Eliquis , hypertension, BPPV presented to the emergency department with epigastric abdominal pain and nausea.  Upon initial evaluation was found to have significantly elevated LFTs with AST of 1147, ALT 1013, alk phos 183 and T. bili of 2.5.  Lipase of 83, normal CBC.  Negative hepatitis panel.  Normal acetaminophen  level.  Normal INR.  Ultrasound right upper quadrant showed thickened gallbladder wall with gallbladder sludge.  CT abdomen pelvis with IV contrast showed 6 mm distal CBD stone with slight mucosal enhancement of the mildly dilated cystic duct and dilated extrahepatic biliary tree.  MRI MRCP confirmed choledocholithiasis with a small distal common bile duct stone.  Patient seen and examined at bedside.  Wife at bedside.  His abdominal pain has improved significantly.  Denies any nausea or vomiting.  Denies diarrhea or constipation.  Denies blood in the stool or black stool.   Past Medical History:  Diagnosis Date   Atrial fibrillation (HCC) 09/2019   Atrial fibrillation with RVR (HCC) 09/16/2019   Benign paroxysmal positional vertigo of left ear 01/21/2018   Hallpike positive left ear down for rotational nystagmus.  Formatting of this note might be different from the original. Hallpike positive left ear down for rotational nystagmus.   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 Continues with fullness in the ears with intermittent popping.  Causes  intermittent unsteadiness.  Presents to discuss ventilation tube placement. EXAMINATION reveals normal external canals and tympanic membranes. PLAN: I am not sure if this is more an inner ear or middle ear problem.  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   Renal insufficiency 09/16/2019    Past Surgical History:  Procedure Laterality Date   ATRIAL FIBRILLATION ABLATION N/A 03/07/2022   Procedure: ATRIAL FIBRILLATION ABLATION;  Surgeon: Lei Pump, MD;  Location: [facility];  Service: Cardiovascular;  Laterality: N/A;   GLAUCOMA SURGERY     MANDIBLE FRACTURE SURGERY     TONSILLECTOMY      Prior to Admission medications   Medication Sig Start Date End Date Taking? Authorizing Provider  apixaban  (ELIQUIS ) 5 MG TABS tablet Take 1 tablet by mouth twice daily 11/11/23  Yes Camnitz, Will Gaylyn Keas, MD  atorvastatin  (LIPITOR) 40 MG tablet Take 1 tablet (40 mg total) by mouth daily. 02/27/23  Yes Camnitz, Babetta Lesch, MD  benzonatate  (TESSALON ) 100 MG capsule Take 100 mg by mouth 3 (three) times daily as needed for cough.   Yes [provider]  brimonidine -timolol  (COMBIGAN ) 0.2-0.5 % ophthalmic solution Place 1 drop into both eyes 2 (two) times daily. 11/11/19  Yes [provider]  diltiazem  (CARDIZEM ) 30 MG tablet Take 1 tablet (30 mg total) by mouth every 4 (four) hours as needed (heart rate greater than 120 bpm or in afib). 09/04/22  Yes Hassan Links, MD  dorzolamide  (TRUSOPT ) 2 % ophthalmic solution Place 1 drop into both eyes 3 (three) times  daily.   Yes [provider]  eplerenone  (INSPRA ) 25 MG tablet Take 1 tablet by mouth once daily 02/27/23  Yes Camnitz, Will Gaylyn Keas, MD  fexofenadine (ALLEGRA) 180 MG tablet Take 180 mg by mouth daily.   Yes [provider]  fluticasone (FLONASE) 50 MCG/ACT nasal spray Place 1  spray into both nostrils daily.   Yes [provider]  furosemide  (LASIX ) 20 MG tablet Take 1 tablet (20 mg total) by mouth daily. Patient taking differently: Take 20 mg by mouth daily as needed for fluid or edema. 11/25/23  Yes Camnitz, Babetta Lesch, MD  hydrochlorothiazide  (MICROZIDE ) 12.5 MG capsule Take 1 capsule (12.5 mg total) by mouth daily. 11/25/23  Yes Camnitz, Will Gaylyn Keas, MD  Misc Natural Products (PUMPKIN SEED OIL) CAPS Take 1 capsule by mouth in the morning and at bedtime. 1,000 mg per cap   Yes [provider]  telmisartan  (MICARDIS ) 40 MG tablet Take 1 tablet (40 mg total) by mouth daily. 02/27/23  Yes Camnitz, Babetta Lesch, MD    Scheduled Meds:  brimonidine   1 drop Both Eyes BID   And   timolol   1 drop Both Eyes BID   dorzolamide   1 drop Left Eye TID   hydrochlorothiazide   12.5 mg Oral Daily   irbesartan  75 mg Oral Daily   spironolactone   25 mg Oral Daily   Continuous Infusions: PRN Meds:.morphine injection, traMADol  Allergies as of 12/07/2023 - Review Complete 12/07/2023  Allergen Reaction Noted   Amoxicillin Rash 03/14/2020   Hydrocodone-acetaminophen  Rash and Other (See Comments) 01/21/2018   Oxycodone Nausea Only 11/30/2021   Amoxicillin-pot clavulanate Rash 06/08/2021    Family History  Problem Relation Age of Onset   Asthma Father    Heart attack Father    Glaucoma Father     Social History   Socioeconomic History   Marital status: Married    Spouse name: Not on file   Number of children: Not on file   Years of education: Not on file   Highest education level: Not on file  Occupational History   Not on file  Tobacco Use   Smoking status: Never   Smokeless tobacco: Never  Vaping Use   Vaping status: Never Used  Substance and Sexual Activity   Alcohol use: Yes    Comment: occ   Drug use: Never   Sexual activity: Yes  Other Topics Concern   Not on file  Social History Narrative   Not on file   Social Drivers of Health    Financial Resource Strain: Low Risk  (03/09/2020)   [facility]   Overall Financial Resource Strain (CARDIA)    Difficulty of Paying Living Expenses: Not hard at all  Food Insecurity: No Food Insecurity (12/07/2023)   Hunger Vital Sign    Worried About Running Out of Food in the Last Year: Never true    Ran Out of Food in the Last Year: Never true  Transportation Needs: No Transportation Needs (12/07/2023)   PRAPARE - Administrator, Civil Service (Medical): No    Lack of Transportation (Non-Medical): No  Physical Activity: Unknown (06/22/2022)   [facility]   Exercise Vital Sign    Days of Exercise per Week: 4 days    Minutes of Exercise per Session: Not on file  Stress: No Stress Concern Present (06/22/2022)   [facility]   [employer] of Occupational Health -  Occupational Stress Questionnaire    Feeling of Stress : Not at all  Social Connections: Socially Integrated (12/07/2023)   Social Connection and Isolation Panel [NHANES]    Frequency of Communication with Friends and Family: More than three times a week    Frequency of Social Gatherings with Friends and Family: Once a week    Attends Religious Services: More than 4 times per year    Active Member of [employer] or Organizations: Yes    Attends Engineer, structural: More than 4 times per year    Marital Status: Married  Catering manager Violence: Not At Risk (12/07/2023)   Humiliation, Afraid, Rape, and Kick questionnaire    Fear of Current or Ex-Partner: No    Emotionally Abused: No    Physically Abused: No    Sexually Abused: No    Review of Systems: All negative except as stated above in HPI.  Physical Exam: Vital signs: Vitals:   12/08/23 0227 12/08/23 0625  BP: 131/71 123/73  Pulse: 62 66  Resp: 18 18  Temp: 98.6 F (37 C) 98.6 F (37 C)  SpO2: 99% 98%   Last BM Date : 12/07/23 General:    Alert,  Well-developed, well-nourished, pleasant and cooperative in NAD Normocephalic, atraumatic, extraocular movement intact Lungs:  Clear throughout to auscultation.   No wheezes, crackles, or rhonchi. No acute distress. Heart:  Regular rate and rhythm; no murmurs, clicks, rubs,  or gallops. Abdomen: Soft, nontender, nondistended, bowel sound present, no peritoneal signs Mood and affect normal Alert and oriented x 3 Rectal:  Deferred  GI:  Lab Results: Recent Labs    12/07/23 1054  WBC 6.2  HGB 15.4  HCT 45.5  PLT 274   BMET Recent Labs    12/07/23 1054  NA 138  K 3.6  CL 101  CO2 19*  GLUCOSE 181*  BUN 24*  CREATININE 1.13  CALCIUM  9.7   LFT Recent Labs    12/07/23 1054  PROT 6.8  ALBUMIN 4.1  AST 1,147*  ALT 1,013*  ALKPHOS 183*  BILITOT 2.5*   PT/INR Recent Labs    12/07/23 1336  LABPROT 15.2  INR 1.2     Studies/Results: MR ABDOMEN MRCP W WO CONTAST Result Date: 12/08/2023 CLINICAL DATA:  Evaluate for biliary obstruction.  Gallstones. EXAM: MRI ABDOMEN WITHOUT AND WITH CONTRAST (INCLUDING MRCP) TECHNIQUE: Multiplanar multisequence MR imaging of the abdomen was performed both before and after the administration of intravenous contrast. Heavily T2-weighted images of the biliary and pancreatic ducts were obtained, and three-dimensional MRCP images were rendered by post processing. CONTRAST:  10mL GADAVIST GADOBUTROL 1 MMOL/ML IV SOLN COMPARISON:  CT 12/07/2023 and U/S abdomen limited 12/07/2023. FINDINGS: Lower chest: No acute findings. Hepatobiliary: No mass or other parenchymal abnormality identified. Gallbladder sludge identified. Stone within the gallbladder neck is again seen measuring 6 mm. Gallbladder wall thickness measures 3 mm. No significant pericholecystic fluid. The common bile duct measures 7 mm. Again seen is a small stone within the distal common bile duct, which measured 6 mm on the CT from prior day, image 10 of series 9. No significant  intrahepatic bile duct dilatation. Pancreas: No mass, inflammatory changes, or other parenchymal abnormality identified. Spleen:  Within normal limits in size and appearance. Adrenals/Urinary Tract: No masses identified. No evidence of hydronephrosis. Stomach/Bowel: Visualized portions within the abdomen are unremarkable. Vascular/Lymphatic: No pathologically enlarged lymph nodes identified. No abdominal aortic aneurysm demonstrated. Other:  No significant free fluid or fluid collections. Musculoskeletal: No suspicious  bone lesions identified. IMPRESSION: 1. Choledocholithiasis with small stone within the distal common bile duct. The common bile duct measures 7 mm. No significant intrahepatic bile duct dilatation. 2. Gallbladder sludge and stone within the gallbladder neck. Gallbladder wall thickness measures 3 mm. No significant pericholecystic fluid. Electronically Signed   By: Kimberley Penman M.D.   On: 12/08/2023 08:41   CT ABDOMEN PELVIS W CONTRAST Result Date: 12/07/2023 CLINICAL DATA:  Epigastric and mid abdominal pain with intermittent nausea EXAM: CT ABDOMEN AND PELVIS WITH CONTRAST TECHNIQUE: Multidetector CT imaging of the abdomen and pelvis was performed using the standard protocol following bolus administration of intravenous contrast. RADIATION DOSE REDUCTION: This exam was performed according to the departmental dose-optimization program which includes automated exposure control, adjustment of the mA and/or kV according to patient size and/or use of iterative reconstruction technique. CONTRAST:  OMNIPAQUE  IOHEXOL  300 MG/ML  SOLN COMPARISON:  12/07/2023 FINDINGS: Lower chest: Scattered bibasilar subsegmental atelectasis. Normal heart size. No pericardial or pleural effusion. Hepatobiliary: No focal hepatic abnormality or intrahepatic biliary dilatation pattern. Normal hepatic contour. Hepatic and portal veins are patent. Mild gallbladder distension including the gallbladder neck, cystic duct,  common hepatic duct, and common bile duct. This appears secondary to distal CBD mildly obstructing stone measuring 6 mm on coronal image 83/7 (choledocholithiasis). Additional nonobstructing gallstone in the gallbladder neck measures 7.7 mm, coronal image 88/7. Trace pericholecystic haziness and slight mucosal enhancement of the cystic duct and mildly dilated extrahepatic biliary tree which may be related to the bstructing distal CBD stone but difficult to exclude associated mild cholangitis/cholecystitis. Pancreas: Unremarkable. No pancreatic ductal dilatation or surrounding inflammatory changes. Spleen: Normal in size without focal abnormality. Adrenals/Urinary Tract: Adrenal glands are unremarkable. Kidneys are normal, without renal calculi, focal lesion, or hydronephrosis. Bladder is unremarkable. Stomach/Bowel: Negative for bowel obstruction, significant dilatation, ileus, or free air. Appendix not visualized. Minor scattered colonic diverticulosis without acute inflammatory process. No free fluid, fluid collection, hemorrhage, hematoma, abscess or ascites. Vascular/Lymphatic: Minor aortoiliac atherosclerosis and tortuosity. No acute vascular process. No veno-occlusive finding. No adenopathy. Reproductive: Prostate enlargement with coarse calcifications. Other: No abdominal wall hernia or abnormality. No abdominopelvic ascites. Musculoskeletal: Degenerative changes throughout the spine with mild associated dextroscoliosis. No acute osseous finding. IMPRESSION: 1. 6 mm distal CBD mildly obstructing stone (choledocholithiasis). 2. Additional nonobstructing 7.7 mm gallstone in the gallbladder neck. 3. Trace pericholecystic haziness and slight mucosal enhancement of the mildly dilated cystic duct and dilated extrahepatic biliary tree which may be related to the obstructing distal CBD stone but difficult to exclude associated mild cholangitis/cholecystitis. 4. Minor colonic diverticulosis without acute inflammatory  process. Aortic Atherosclerosis (ICD10-I70.0). Electronically Signed   By: Melven Stable.  Shick M.D.   On: 12/07/2023 15:01   US  ABDOMEN LIMITED RUQ (LIVER/GB) Result Date: 12/07/2023 CLINICAL DATA:  [id number] Transaminitis [id number], mid abdominal pain EXAM: ULTRASOUND ABDOMEN LIMITED RIGHT UPPER QUADRANT COMPARISON:  None Available. FINDINGS: Gallbladder: Mild gallbladder distension with a thickened wall measuring 7 mm. Hypoechoic intraluminal sludge in the gallbladder body and neck. Subcentimeter gallstones suspected in the gallbladder neck measuring up to 8 mm. No pericholecystic fluid. No Murphy's sign elicited during the exam. Common bile duct: Diameter: Unable to visualize accurately because of bowel gas and high position of the liver. Liver: Mild increased echogenicity, nonspecific. No large focal hepatic abnormality. No intrahepatic biliary dilatation. Normal liver contour. No surrounding free fluid or ascites. Portal vein is patent on color Doppler imaging with normal direction of blood flow towards the liver. Other: No free  fluid. IMPRESSION: 1. Mild gallbladder distension with sludge and subcentimeter cholelithiasis. Thickened gallbladder wall. Negative Murphys sign. Appearance remains nonspecific. 2. Nonvisualized common bile duct. Electronically Signed   By: Melven Stable.  Shick M.D.   On: 12/07/2023 14:40   DG Foot Complete Left Result Date: 12/07/2023 CLINICAL DATA:  leg swelling EXAM: LEFT FOOT - COMPLETE 3+ VIEW COMPARISON:  None Available. FINDINGS: There is no evidence of fracture or dislocation. Mild dorsal spurring in the midfoot. There is no evidence of arthropathy or other focal bone abnormality. Calcaneal spur. Soft tissues are unremarkable. IMPRESSION: 1. No acute findings. 2. Mild dorsal spurring in the midfoot. Electronically Signed   By: Nicoletta Barrier M.D.   On: 12/07/2023 12:27   DG Chest Portable 1 View Result Date: 12/07/2023 CLINICAL DATA:  Epigastric pain EXAM: PORTABLE CHEST - 1 VIEW COMPARISON:   05/25/2021 FINDINGS: Low lung volumes with linear atelectasis in both lung bases. No overt edema. No pneumothorax. Heart size and mediastinal contours are within normal limits. No effusion. Advanced left glenohumeral DJD. IMPRESSION: Low volumes with bibasilar atelectasis. Electronically Signed   By: Nicoletta Barrier M.D.   On: 12/07/2023 12:26   US  Venous Img Lower Unilateral Left (DVT) Result Date: 12/07/2023 CLINICAL DATA:  Calf swelling. 2-3 weeks. On Eliquis  for atrial fibrillation EXAM: Left LOWER EXTREMITY VENOUS DOPPLER ULTRASOUND TECHNIQUE: Gray-scale sonography with graded compression, as well as color Doppler and duplex ultrasound were performed to evaluate the lower extremity deep venous systems from the level of the common femoral vein and including the common femoral, femoral, profunda femoral, popliteal and calf veins including the posterior tibial, peroneal and gastrocnemius veins when visible. The superficial great saphenous vein was also interrogated. Spectral Doppler was utilized to evaluate flow at rest and with distal augmentation maneuvers in the common femoral, femoral and popliteal veins. COMPARISON:  None Available. FINDINGS: Contralateral Common Femoral Vein: Respiratory phasicity is normal and symmetric with the symptomatic side. No evidence of thrombus. Normal compressibility. Common Femoral Vein: No evidence of thrombus. Normal compressibility, respiratory phasicity and response to augmentation. Saphenofemoral Junction: No evidence of thrombus. Normal compressibility and flow on color Doppler imaging. Profunda Femoral Vein: No evidence of thrombus. Normal compressibility and flow on color Doppler imaging. Femoral Vein: No evidence of thrombus. Normal compressibility, respiratory phasicity and response to augmentation. Popliteal Vein: No evidence of thrombus. Normal compressibility, respiratory phasicity and response to augmentation. Calf Veins: No evidence of thrombus. Normal  compressibility and flow on color Doppler imaging. Superficial Great Saphenous Vein: No evidence of thrombus. Normal compressibility. Venous Reflux:  None. Other Findings:  None. IMPRESSION: No evidence of left lower extremity DVT. Electronically Signed   By: Adrianna Horde M.D.   On: 12/07/2023 12:18    Impression/Plan: -Choledocholithiasis with MRI confirming 6 mm distal CBD stone. - Abnormal LFTs with AST ALT in the thousands.  Unusual presentation.  Hepatitis panel negative. - History of atrial fibrillation.  Last dose of Eliquis  on Friday night.  Recommendations ---------------------------- - Follow repeat LFTs. - Continue to hold Eliquis  - Tentatively plan for ERCP tomorrow. - Patient will eventually need cholecystectomy  Risks (post ERCP pancreatitis, bleeding, infection, bowel perforation that could require surgery, sedation-related changes in cardiopulmonary systems), benefits (identification and possible treatment of source of symptoms, exclusion of certain causes of symptoms), and alternatives (watchful waiting, radiographic imaging studies, empiric medical treatment)  were explained to patient/family in detail and patient wishes to proceed.     LOS: 1 day   Felecia Hopper  MD, FACP  12/08/2023, 8:49 AM  Contact #  [phone]

## 2023-12-08 NOTE — Consult Note (Signed)
 Referring Provider: [facility] Primary Care Physician:  Luther Saltness, MD Primary Gastroenterologist: Unassigned/[facility]  Reason for Consultation: Abdominal pain, abnormal LFTs  HPI: Jeffrey Contreras is a 78 y.o. male with past medical history of atrial fibrillation on Eliquis , hypertension, BPPV presented to the emergency department with epigastric abdominal pain and nausea.  Upon initial evaluation was found to have significantly elevated LFTs with AST of 1147, ALT 1013, alk phos 183 and T. bili of 2.5.  Lipase of 83, normal CBC.  Negative hepatitis panel.  Normal acetaminophen  level.  Normal INR.  Ultrasound right upper quadrant showed thickened gallbladder wall with gallbladder sludge.  CT abdomen pelvis with IV contrast showed 6 mm distal CBD stone with slight mucosal enhancement of the mildly dilated cystic duct and dilated extrahepatic biliary tree.  MRI MRCP confirmed choledocholithiasis with a small distal common bile duct stone.  Patient seen and examined at bedside.  Wife at bedside.  His abdominal pain has improved significantly.  Denies any nausea or vomiting.  Denies diarrhea or constipation.  Denies blood in the stool or black stool.   Past Medical History:  Diagnosis Date   Atrial fibrillation (HCC) 09/2019   Atrial fibrillation with RVR (HCC) 09/16/2019   Benign paroxysmal positional vertigo of left ear 01/21/2018   Hallpike positive left ear down for rotational nystagmus.  Formatting of this note might be different from the original. Hallpike positive left ear down for rotational nystagmus.   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 Continues with fullness in the ears with intermittent popping.  Causes  intermittent unsteadiness.  Presents to discuss ventilation tube placement. EXAMINATION reveals normal external canals and tympanic membranes. PLAN: I am not sure if this is more an inner ear or middle ear problem.  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   Renal insufficiency 09/16/2019    Past Surgical History:  Procedure Laterality Date   ATRIAL FIBRILLATION ABLATION N/A 03/07/2022   Procedure: ATRIAL FIBRILLATION ABLATION;  Surgeon: Lei Pump, MD;  Location: [facility];  Service: Cardiovascular;  Laterality: N/A;   GLAUCOMA SURGERY     MANDIBLE FRACTURE SURGERY     TONSILLECTOMY      Prior to Admission medications   Medication Sig Start Date End Date Taking? Authorizing Provider  apixaban  (ELIQUIS ) 5 MG TABS tablet Take 1 tablet by mouth twice daily 11/11/23  Yes Camnitz, Will Gaylyn Keas, MD  atorvastatin  (LIPITOR) 40 MG tablet Take 1 tablet (40 mg total) by mouth daily. 02/27/23  Yes Camnitz, Babetta Lesch, MD  benzonatate  (TESSALON ) 100 MG capsule Take 100 mg by mouth 3 (three) times daily as needed for cough.   Yes [provider]  brimonidine -timolol  (COMBIGAN ) 0.2-0.5 % ophthalmic solution Place 1 drop into both eyes 2 (two) times daily. 11/11/19  Yes [provider]  diltiazem  (CARDIZEM ) 30 MG tablet Take 1 tablet (30 mg total) by mouth every 4 (four) hours as needed (heart rate greater than 120 bpm or in afib). 09/04/22  Yes Hassan Links, MD  dorzolamide  (TRUSOPT ) 2 % ophthalmic solution Place 1 drop into both eyes 3 (three) times  daily.   Yes [provider]  eplerenone  (INSPRA ) 25 MG tablet Take 1 tablet by mouth once daily 02/27/23  Yes Camnitz, Will Gaylyn Keas, MD  fexofenadine (ALLEGRA) 180 MG tablet Take 180 mg by mouth daily.   Yes [provider]  fluticasone (FLONASE) 50 MCG/ACT nasal spray Place 1  spray into both nostrils daily.   Yes [provider]  furosemide  (LASIX ) 20 MG tablet Take 1 tablet (20 mg total) by mouth daily. Patient taking differently: Take 20 mg by mouth daily as needed for fluid or edema. 11/25/23  Yes Camnitz, Babetta Lesch, MD  hydrochlorothiazide  (MICROZIDE ) 12.5 MG capsule Take 1 capsule (12.5 mg total) by mouth daily. 11/25/23  Yes Camnitz, Will Gaylyn Keas, MD  Misc Natural Products (PUMPKIN SEED OIL) CAPS Take 1 capsule by mouth in the morning and at bedtime. 1,000 mg per cap   Yes [provider]  telmisartan  (MICARDIS ) 40 MG tablet Take 1 tablet (40 mg total) by mouth daily. 02/27/23  Yes Camnitz, Babetta Lesch, MD    Scheduled Meds:  brimonidine   1 drop Both Eyes BID   And   timolol   1 drop Both Eyes BID   dorzolamide   1 drop Left Eye TID   hydrochlorothiazide   12.5 mg Oral Daily   irbesartan  75 mg Oral Daily   spironolactone   25 mg Oral Daily   Continuous Infusions: PRN Meds:.morphine injection, traMADol  Allergies as of 12/07/2023 - Review Complete 12/07/2023  Allergen Reaction Noted   Amoxicillin Rash 03/14/2020   Hydrocodone-acetaminophen  Rash and Other (See Comments) 01/21/2018   Oxycodone Nausea Only 11/30/2021   Amoxicillin-pot clavulanate Rash 06/08/2021    Family History  Problem Relation Age of Onset   Asthma Father    Heart attack Father    Glaucoma Father     Social History   Socioeconomic History   Marital status: Married    Spouse name: Not on file   Number of children: Not on file   Years of education: Not on file   Highest education level: Not on file  Occupational History   Not on file  Tobacco Use   Smoking status: Never   Smokeless tobacco: Never  Vaping Use   Vaping status: Never Used  Substance and Sexual Activity   Alcohol use: Yes    Comment: occ   Drug use: Never   Sexual activity: Yes  Other Topics Concern   Not on file  Social History Narrative   Not on file   Social Drivers of Health    Financial Resource Strain: Low Risk  (03/09/2020)   [facility]   Overall Financial Resource Strain (CARDIA)    Difficulty of Paying Living Expenses: Not hard at all  Food Insecurity: No Food Insecurity (12/07/2023)   Hunger Vital Sign    Worried About Running Out of Food in the Last Year: Never true    Ran Out of Food in the Last Year: Never true  Transportation Needs: No Transportation Needs (12/07/2023)   PRAPARE - Administrator, Civil Service (Medical): No    Lack of Transportation (Non-Medical): No  Physical Activity: Unknown (06/22/2022)   [facility]   Exercise Vital Sign    Days of Exercise per Week: 4 days    Minutes of Exercise per Session: Not on file  Stress: No Stress Concern Present (06/22/2022)   [facility]   [employer] of Occupational Health -  Occupational Stress Questionnaire    Feeling of Stress : Not at all  Social Connections: Socially Integrated (12/07/2023)   Social Connection and Isolation Panel [NHANES]    Frequency of Communication with Friends and Family: More than three times a week    Frequency of Social Gatherings with Friends and Family: Once a week    Attends Religious Services: More than 4 times per year    Active Member of [employer] or Organizations: Yes    Attends Engineer, structural: More than 4 times per year    Marital Status: Married  Catering manager Violence: Not At Risk (12/07/2023)   Humiliation, Afraid, Rape, and Kick questionnaire    Fear of Current or Ex-Partner: No    Emotionally Abused: No    Physically Abused: No    Sexually Abused: No    Review of Systems: All negative except as stated above in HPI.  Physical Exam: Vital signs: Vitals:   12/08/23 0227 12/08/23 0625  BP: 131/71 123/73  Pulse: 62 66  Resp: 18 18  Temp: 98.6 F (37 C) 98.6 F (37 C)  SpO2: 99% 98%   Last BM Date : 12/07/23 General:    Alert,  Well-developed, well-nourished, pleasant and cooperative in NAD Normocephalic, atraumatic, extraocular movement intact Lungs:  Clear throughout to auscultation.   No wheezes, crackles, or rhonchi. No acute distress. Heart:  Regular rate and rhythm; no murmurs, clicks, rubs,  or gallops. Abdomen: Soft, nontender, nondistended, bowel sound present, no peritoneal signs Mood and affect normal Alert and oriented x 3 Rectal:  Deferred  GI:  Lab Results: Recent Labs    12/07/23 1054  WBC 6.2  HGB 15.4  HCT 45.5  PLT 274   BMET Recent Labs    12/07/23 1054  NA 138  K 3.6  CL 101  CO2 19*  GLUCOSE 181*  BUN 24*  CREATININE 1.13  CALCIUM  9.7   LFT Recent Labs    12/07/23 1054  PROT 6.8  ALBUMIN 4.1  AST 1,147*  ALT 1,013*  ALKPHOS 183*  BILITOT 2.5*   PT/INR Recent Labs    12/07/23 1336  LABPROT 15.2  INR 1.2     Studies/Results: MR ABDOMEN MRCP W WO CONTAST Result Date: 12/08/2023 CLINICAL DATA:  Evaluate for biliary obstruction.  Gallstones. EXAM: MRI ABDOMEN WITHOUT AND WITH CONTRAST (INCLUDING MRCP) TECHNIQUE: Multiplanar multisequence MR imaging of the abdomen was performed both before and after the administration of intravenous contrast. Heavily T2-weighted images of the biliary and pancreatic ducts were obtained, and three-dimensional MRCP images were rendered by post processing. CONTRAST:  10mL GADAVIST GADOBUTROL 1 MMOL/ML IV SOLN COMPARISON:  CT 12/07/2023 and U/S abdomen limited 12/07/2023. FINDINGS: Lower chest: No acute findings. Hepatobiliary: No mass or other parenchymal abnormality identified. Gallbladder sludge identified. Stone within the gallbladder neck is again seen measuring 6 mm. Gallbladder wall thickness measures 3 mm. No significant pericholecystic fluid. The common bile duct measures 7 mm. Again seen is a small stone within the distal common bile duct, which measured 6 mm on the CT from prior day, image 10 of series 9. No significant  intrahepatic bile duct dilatation. Pancreas: No mass, inflammatory changes, or other parenchymal abnormality identified. Spleen:  Within normal limits in size and appearance. Adrenals/Urinary Tract: No masses identified. No evidence of hydronephrosis. Stomach/Bowel: Visualized portions within the abdomen are unremarkable. Vascular/Lymphatic: No pathologically enlarged lymph nodes identified. No abdominal aortic aneurysm demonstrated. Other:  No significant free fluid or fluid collections. Musculoskeletal: No suspicious  bone lesions identified. IMPRESSION: 1. Choledocholithiasis with small stone within the distal common bile duct. The common bile duct measures 7 mm. No significant intrahepatic bile duct dilatation. 2. Gallbladder sludge and stone within the gallbladder neck. Gallbladder wall thickness measures 3 mm. No significant pericholecystic fluid. Electronically Signed   By: Kimberley Penman M.D.   On: 12/08/2023 08:41   CT ABDOMEN PELVIS W CONTRAST Result Date: 12/07/2023 CLINICAL DATA:  Epigastric and mid abdominal pain with intermittent nausea EXAM: CT ABDOMEN AND PELVIS WITH CONTRAST TECHNIQUE: Multidetector CT imaging of the abdomen and pelvis was performed using the standard protocol following bolus administration of intravenous contrast. RADIATION DOSE REDUCTION: This exam was performed according to the departmental dose-optimization program which includes automated exposure control, adjustment of the mA and/or kV according to patient size and/or use of iterative reconstruction technique. CONTRAST:  OMNIPAQUE  IOHEXOL  300 MG/ML  SOLN COMPARISON:  12/07/2023 FINDINGS: Lower chest: Scattered bibasilar subsegmental atelectasis. Normal heart size. No pericardial or pleural effusion. Hepatobiliary: No focal hepatic abnormality or intrahepatic biliary dilatation pattern. Normal hepatic contour. Hepatic and portal veins are patent. Mild gallbladder distension including the gallbladder neck, cystic duct,  common hepatic duct, and common bile duct. This appears secondary to distal CBD mildly obstructing stone measuring 6 mm on coronal image 83/7 (choledocholithiasis). Additional nonobstructing gallstone in the gallbladder neck measures 7.7 mm, coronal image 88/7. Trace pericholecystic haziness and slight mucosal enhancement of the cystic duct and mildly dilated extrahepatic biliary tree which may be related to the bstructing distal CBD stone but difficult to exclude associated mild cholangitis/cholecystitis. Pancreas: Unremarkable. No pancreatic ductal dilatation or surrounding inflammatory changes. Spleen: Normal in size without focal abnormality. Adrenals/Urinary Tract: Adrenal glands are unremarkable. Kidneys are normal, without renal calculi, focal lesion, or hydronephrosis. Bladder is unremarkable. Stomach/Bowel: Negative for bowel obstruction, significant dilatation, ileus, or free air. Appendix not visualized. Minor scattered colonic diverticulosis without acute inflammatory process. No free fluid, fluid collection, hemorrhage, hematoma, abscess or ascites. Vascular/Lymphatic: Minor aortoiliac atherosclerosis and tortuosity. No acute vascular process. No veno-occlusive finding. No adenopathy. Reproductive: Prostate enlargement with coarse calcifications. Other: No abdominal wall hernia or abnormality. No abdominopelvic ascites. Musculoskeletal: Degenerative changes throughout the spine with mild associated dextroscoliosis. No acute osseous finding. IMPRESSION: 1. 6 mm distal CBD mildly obstructing stone (choledocholithiasis). 2. Additional nonobstructing 7.7 mm gallstone in the gallbladder neck. 3. Trace pericholecystic haziness and slight mucosal enhancement of the mildly dilated cystic duct and dilated extrahepatic biliary tree which may be related to the obstructing distal CBD stone but difficult to exclude associated mild cholangitis/cholecystitis. 4. Minor colonic diverticulosis without acute inflammatory  process. Aortic Atherosclerosis (ICD10-I70.0). Electronically Signed   By: Melven Stable.  Shick M.D.   On: 12/07/2023 15:01   US  ABDOMEN LIMITED RUQ (LIVER/GB) Result Date: 12/07/2023 CLINICAL DATA:  [id number] Transaminitis [id number], mid abdominal pain EXAM: ULTRASOUND ABDOMEN LIMITED RIGHT UPPER QUADRANT COMPARISON:  None Available. FINDINGS: Gallbladder: Mild gallbladder distension with a thickened wall measuring 7 mm. Hypoechoic intraluminal sludge in the gallbladder body and neck. Subcentimeter gallstones suspected in the gallbladder neck measuring up to 8 mm. No pericholecystic fluid. No Murphy's sign elicited during the exam. Common bile duct: Diameter: Unable to visualize accurately because of bowel gas and high position of the liver. Liver: Mild increased echogenicity, nonspecific. No large focal hepatic abnormality. No intrahepatic biliary dilatation. Normal liver contour. No surrounding free fluid or ascites. Portal vein is patent on color Doppler imaging with normal direction of blood flow towards the liver. Other: No free  fluid. IMPRESSION: 1. Mild gallbladder distension with sludge and subcentimeter cholelithiasis. Thickened gallbladder wall. Negative Murphys sign. Appearance remains nonspecific. 2. Nonvisualized common bile duct. Electronically Signed   By: Melven Stable.  Shick M.D.   On: 12/07/2023 14:40   DG Foot Complete Left Result Date: 12/07/2023 CLINICAL DATA:  leg swelling EXAM: LEFT FOOT - COMPLETE 3+ VIEW COMPARISON:  None Available. FINDINGS: There is no evidence of fracture or dislocation. Mild dorsal spurring in the midfoot. There is no evidence of arthropathy or other focal bone abnormality. Calcaneal spur. Soft tissues are unremarkable. IMPRESSION: 1. No acute findings. 2. Mild dorsal spurring in the midfoot. Electronically Signed   By: Nicoletta Barrier M.D.   On: 12/07/2023 12:27   DG Chest Portable 1 View Result Date: 12/07/2023 CLINICAL DATA:  Epigastric pain EXAM: PORTABLE CHEST - 1 VIEW COMPARISON:   05/25/2021 FINDINGS: Low lung volumes with linear atelectasis in both lung bases. No overt edema. No pneumothorax. Heart size and mediastinal contours are within normal limits. No effusion. Advanced left glenohumeral DJD. IMPRESSION: Low volumes with bibasilar atelectasis. Electronically Signed   By: Nicoletta Barrier M.D.   On: 12/07/2023 12:26   US  Venous Img Lower Unilateral Left (DVT) Result Date: 12/07/2023 CLINICAL DATA:  Calf swelling. 2-3 weeks. On Eliquis  for atrial fibrillation EXAM: Left LOWER EXTREMITY VENOUS DOPPLER ULTRASOUND TECHNIQUE: Gray-scale sonography with graded compression, as well as color Doppler and duplex ultrasound were performed to evaluate the lower extremity deep venous systems from the level of the common femoral vein and including the common femoral, femoral, profunda femoral, popliteal and calf veins including the posterior tibial, peroneal and gastrocnemius veins when visible. The superficial great saphenous vein was also interrogated. Spectral Doppler was utilized to evaluate flow at rest and with distal augmentation maneuvers in the common femoral, femoral and popliteal veins. COMPARISON:  None Available. FINDINGS: Contralateral Common Femoral Vein: Respiratory phasicity is normal and symmetric with the symptomatic side. No evidence of thrombus. Normal compressibility. Common Femoral Vein: No evidence of thrombus. Normal compressibility, respiratory phasicity and response to augmentation. Saphenofemoral Junction: No evidence of thrombus. Normal compressibility and flow on color Doppler imaging. Profunda Femoral Vein: No evidence of thrombus. Normal compressibility and flow on color Doppler imaging. Femoral Vein: No evidence of thrombus. Normal compressibility, respiratory phasicity and response to augmentation. Popliteal Vein: No evidence of thrombus. Normal compressibility, respiratory phasicity and response to augmentation. Calf Veins: No evidence of thrombus. Normal  compressibility and flow on color Doppler imaging. Superficial Great Saphenous Vein: No evidence of thrombus. Normal compressibility. Venous Reflux:  None. Other Findings:  None. IMPRESSION: No evidence of left lower extremity DVT. Electronically Signed   By: Adrianna Horde M.D.   On: 12/07/2023 12:18    Impression/Plan: -Choledocholithiasis with MRI confirming 6 mm distal CBD stone. - Abnormal LFTs with AST ALT in the thousands.  Unusual presentation.  Hepatitis panel negative. - History of atrial fibrillation.  Last dose of Eliquis  on Friday night.  Recommendations ---------------------------- - Follow repeat LFTs. - Continue to hold Eliquis  - Tentatively plan for ERCP tomorrow. - Patient will eventually need cholecystectomy  Risks (post ERCP pancreatitis, bleeding, infection, bowel perforation that could require surgery, sedation-related changes in cardiopulmonary systems), benefits (identification and possible treatment of source of symptoms, exclusion of certain causes of symptoms), and alternatives (watchful waiting, radiographic imaging studies, empiric medical treatment)  were explained to patient/family in detail and patient wishes to proceed.     LOS: 1 day   Felecia Hopper  MD, FACP  12/08/2023, 8:49 AM  Contact #  [phone]

## 2023-12-08 NOTE — Plan of Care (Signed)
 VSS. No c/o pain. Patient NPO since midnight for MRCP today. NSR/SB on telemetry. LBM 4/26. No acute events overnight.  Problem: Education: Goal: Knowledge of General Education information will improve Description: Including pain rating scale, medication(s)/side effects and non-pharmacologic comfort measures Outcome: Progressing   Problem: Health Behavior/Discharge Planning: Goal: Ability to manage health-related needs will improve Outcome: Progressing   Problem: Clinical Measurements: Goal: Ability to maintain clinical measurements within normal limits will improve Outcome: Progressing Goal: Will remain free from infection Outcome: Progressing   Problem: Pain Managment: Goal: General experience of comfort will improve and/or be controlled Outcome: Progressing   Problem: Safety: Goal: Ability to remain free from injury will improve Outcome: Progressing

## 2023-12-08 NOTE — Plan of Care (Signed)

## 2023-12-08 NOTE — Progress Notes (Signed)
   12/08/23 0942  TOC Brief Assessment  Insurance and Status Reviewed  Patient has primary care physician Yes  Home environment has been reviewed From home with spouse  Prior level of function: Independent  Prior/Current Home Services No current home services  Social Drivers of Health Review SDOH reviewed no interventions necessary  Readmission risk has been reviewed Yes Marrie Sizer)  Transition of care needs no transition of care needs at this time     Transition of Care Department (TOC) has reviewed patient and no TOC needs have been identified at this time. We will continue to monitor patient advancement through interdisciplinary progression rounds. If new patient transition needs arise, please place a TOC consult.

## 2023-12-08 NOTE — Consult Note (Signed)
 Reason for Consult: Choledocholithiasis: Referring Physician: Aldona Amel MD  Jeffrey Contreras is an 78 y.o. male.  HPI: 78 year old male admitted for choledocholithiasis.  Developed abdominal pain about 2 days ago was found to have a common duct stone as well as a gallstone on workup.  ERCP has been planned for tomorrow and general surgery's been consulted.  He denies abdominal pain currently.  Past Medical History:  Diagnosis Date   Atrial fibrillation (HCC) 09/2019   Atrial fibrillation with RVR (HCC) 09/16/2019   Benign paroxysmal positional vertigo of left ear 01/21/2018   Hallpike positive left ear down for rotational nystagmus.  Formatting of this note might be different from the original. Hallpike positive left ear down for rotational nystagmus.   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 Continues with fullness in the ears with intermittent popping.  Causes intermittent unsteadiness.  Presents to discuss ventilation tube placement. EXAMINATION reveals normal external canals and tympanic membranes. PLAN: I am not sure if this is more an inner ear or middle ear problem.  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   Renal insufficiency 09/16/2019    Past Surgical History:  Procedure Laterality Date   ATRIAL FIBRILLATION ABLATION N/A 03/07/2022   Procedure: ATRIAL FIBRILLATION ABLATION;  Surgeon: Lei Pump, MD;  Location: [facility];  Service: Cardiovascular;  Laterality: N/A;   GLAUCOMA SURGERY     MANDIBLE FRACTURE SURGERY     TONSILLECTOMY      Family History  Problem Relation Age of Onset    Asthma Father    Heart attack Father    Glaucoma Father     Social History:  reports that he has never smoked. He has never used smokeless tobacco. He reports current alcohol use. He reports that he does not use drugs.  Allergies:  Allergies  Allergen Reactions   Amoxicillin Rash   Hydrocodone-Acetaminophen  Rash and Other (See Comments)    Pt states when taking this med, he feels like he is floating.     Oxycodone Nausea Only   Amoxicillin-Pot Clavulanate Rash    Other reaction(s): rash    Medications: I have reviewed the patient's current medications.  Results for orders placed or performed during the hospital encounter of 12/07/23 (from the past 48 hours)  Lipase, blood     Status: Abnormal   Collection Time: 12/07/23 10:54 AM  Result Value Ref Range   Lipase 82 (H) 11 - 51 U/L    Comment: Performed at [facility], [address] [place], [place] [address]  Comprehensive metabolic panel     Status: Abnormal   Collection Time: 12/07/23 10:54 AM  Result Value Ref Range   Sodium 138 135 - 145 mmol/L   Potassium 3.6 3.5 - 5.1 mmol/L   Chloride 101 98 - 111 mmol/L   CO2 19 (L) 22 - 32 mmol/L   Glucose, Bld 181 (H) 70 - 99 mg/dL    Comment: Glucose reference range applies only to samples taken after fasting for at least 8 hours.   BUN 24 (H) 8 - 23 mg/dL   Creatinine, Ser 6.04  0.61 - 1.24 mg/dL   Calcium  9.7 8.9 - 10.3 mg/dL   Total Protein 6.8 6.5 - 8.1 g/dL   Albumin 4.1 3.5 - 5.0 g/dL   AST 1,610 (H) 15 - 41 U/L   ALT 1,013 (H) 0 - 44 U/L   Alkaline Phosphatase 183 (H) 38 - 126 U/L   Total Bilirubin 2.5 (H) 0.0 - 1.2 mg/dL   GFR, Estimated >96 >04 mL/min    Comment: (NOTE) Calculated using the CKD-EPI Creatinine Equation (2021)    Anion gap 18 (H) 5 - 15    Comment: Performed at [facility], [address] [place], [place] [address]  CBC     Status: None   Collection Time: 12/07/23 10:54 AM  Result Value Ref Range   WBC 6.2 4.0 - 10.5  K/uL   RBC 4.99 4.22 - 5.81 MIL/uL   Hemoglobin 15.4 13.0 - 17.0 g/dL   HCT 11.9 14.7 - 82.9 %   MCV 91.2 80.0 - 100.0 fL   MCH 30.9 26.0 - 34.0 pg   MCHC 33.8 30.0 - 36.0 g/dL   RDW 56.2 13.0 - 86.5 %   Platelets 274 150 - 400 K/uL   nRBC 0.0 0.0 - 0.2 %    Comment: Performed at [facility], [address] [place], [place] [address]  Urinalysis, Routine w reflex microscopic -Urine, Clean Catch     Status: Abnormal   Collection Time: 12/07/23 12:53 PM  Result Value Ref Range   Color, Urine YELLOW YELLOW   APPearance CLEAR CLEAR   Specific Gravity, Urine 1.020 1.005 - 1.030   pH 6.0 5.0 - 8.0   Glucose, UA NEGATIVE NEGATIVE mg/dL   Hgb urine dipstick NEGATIVE NEGATIVE   Bilirubin Urine SMALL (A) NEGATIVE   Ketones, ur NEGATIVE NEGATIVE mg/dL   Protein, ur NEGATIVE NEGATIVE mg/dL   Nitrite NEGATIVE NEGATIVE   Leukocytes,Ua TRACE (A) NEGATIVE    Comment: Performed at [facility], [address] [place], [place] [address]  Urinalysis, Microscopic (reflex)     Status: Abnormal   Collection Time: 12/07/23 12:53 PM  Result Value Ref Range   RBC / HPF 0-5 0 - 5 RBC/hpf   WBC, UA 6-10 0 - 5 WBC/hpf   Bacteria, UA FEW (A) NONE SEEN   Squamous Epithelial / HPF 0-5 0 - 5 /HPF   Hyaline Casts, UA PRESENT    Granular Casts, UA PRESENT     Comment: Performed at [facility], [address] [place], [place] [address]  Hepatitis panel, acute     Status: None   Collection Time: 12/07/23  1:36 PM  Result Value Ref Range   Hepatitis B Surface Ag NON REACTIVE NON REACTIVE   HCV Ab NON REACTIVE NON REACTIVE    Comment: (NOTE) Nonreactive HCV antibody screen is consistent with no HCV infections,  unless recent infection is suspected or other evidence exists to indicate HCV infection.     Hep A IgM NON REACTIVE NON REACTIVE   Hep B C IgM NON REACTIVE NON REACTIVE    Comment: Performed at [facility], [address], [place], [place] [address]   Acetaminophen  level     Status: Abnormal   Collection Time: 12/07/23  1:36 PM  Result Value Ref Range   Acetaminophen  (Tylenol ), Serum <10 (L) 10 - 30 ug/mL    Comment: (NOTE) Toxic concentrations can be more effectively related to post dose  interval; > 200, > 100, and > 50 ug/mL serum concentrations correspond to toxic concentrations at 4, 8, and 12 hours post dose, respectively.  Performed at [facility], [address] [place], [place] [address]   Protime-INR     Status: None   Collection Time: 12/07/23  1:36 PM  Result Value Ref Range   Prothrombin Time 15.2 11.4 - 15.2 seconds   INR 1.2 0.8 - 1.2    Comment: (NOTE) INR goal varies based on device and disease states. Performed at [facility], [address], [place], [place] [address]   Magnesium     Status: None   Collection Time: 12/07/23  8:48 PM  Result Value Ref Range   Magnesium 2.1 1.7 - 2.4 mg/dL    Comment: Performed at [facility], [address] [place], [place] [address]  Uric acid     Status: None   Collection Time: 12/07/23  8:48 PM  Result Value Ref Range   Uric Acid, Serum 8.6 3.7 - 8.6 mg/dL    Comment: HEMOLYSIS AT THIS LEVEL MAY AFFECT RESULT Performed at [facility], [address] [place], [place] [address]   Comprehensive metabolic panel     Status: Abnormal   Collection Time: 12/08/23  8:20 AM  Result Value Ref Range   Sodium 138 135 - 145 mmol/L   Potassium 3.6 3.5 - 5.1 mmol/L   Chloride 106 98 - 111 mmol/L   CO2 22 22 - 32 mmol/L   Glucose, Bld 112 (H) 70 - 99 mg/dL    Comment: Glucose reference range applies only to samples taken after fasting for at least 8 hours.   BUN 22 8 - 23 mg/dL   Creatinine, Ser 8.46 0.61 - 1.24 mg/dL   Calcium  8.8 (L) 8.9 - 10.3 mg/dL   Total Protein 5.8 (L) 6.5 - 8.1 g/dL   Albumin 3.3 (L) 3.5 - 5.0 g/dL   AST 962 (H) 15 - 41 U/L   ALT 925 (H) 0 - 44 U/L   Alkaline Phosphatase 186 (H) 38 -  126 U/L   Total Bilirubin 4.1 (H) 0.0 - 1.2 mg/dL   GFR, Estimated >95 >28 mL/min    Comment: (NOTE) Calculated using the CKD-EPI Creatinine Equation (2021)    Anion gap 10 5 - 15    Comment: Performed at [facility], [address], [place], [place] [address]  CBC     Status: None   Collection Time: 12/08/23  8:20 AM  Result Value Ref Range   WBC 6.3 4.0 - 10.5 K/uL   RBC 4.48 4.22 - 5.81 MIL/uL   Hemoglobin 14.2 13.0 - 17.0 g/dL   HCT 40.1 02.7 - 25.3 %   MCV 94.0 80.0 - 100.0 fL   MCH 31.7 26.0 - 34.0 pg   MCHC 33.7 30.0 - 36.0 g/dL   RDW 66.4 40.3 - 47.4 %   Platelets 222 150 - 400 K/uL   nRBC 0.0 0.0 - 0.2 %    Comment: Performed at [facility], [address], [place], [place] [address]  Protime-INR     Status: None   Collection Time: 12/08/23  8:20 AM  Result Value Ref Range   Prothrombin Time 15.1 11.4 - 15.2 seconds   INR 1.2 0.8 - 1.2    Comment: (NOTE) INR goal varies based on device and disease states. Performed at [facility], [address] [place], [place] [address]   Lipase, blood     Status: Abnormal   Collection Time: 12/08/23  8:20 AM  Result Value Ref Range   Lipase 55 (H) 11 - 51 U/L    Comment: Performed at [facility], [address] [place], [place] [address]    MR ABDOMEN MRCP W WO CONTAST Result Date: 12/08/2023 CLINICAL DATA:  Evaluate for biliary obstruction.  Gallstones. EXAM: MRI ABDOMEN WITHOUT AND WITH CONTRAST (INCLUDING MRCP) TECHNIQUE: Multiplanar multisequence MR imaging of the abdomen was performed both before and after the administration of intravenous contrast. Heavily T2-weighted images of the biliary and pancreatic ducts were obtained, and three-dimensional MRCP images were rendered by post processing. CONTRAST:  10mL GADAVIST GADOBUTROL 1 MMOL/ML IV SOLN COMPARISON:  CT 12/07/2023 and U/S abdomen limited 12/07/2023. FINDINGS: Lower chest: No acute  findings. Hepatobiliary: No mass or other parenchymal abnormality identified. Gallbladder sludge identified. Stone within the gallbladder neck is again seen measuring 6 mm. Gallbladder wall thickness measures 3 mm. No significant pericholecystic fluid. The common bile duct measures 7 mm. Again seen is a small stone within the distal common bile duct, which measured 6 mm on the CT from prior day, image 10 of series 9. No significant intrahepatic bile duct dilatation. Pancreas: No mass, inflammatory changes, or other parenchymal abnormality identified. Spleen:  Within normal limits in size and appearance. Adrenals/Urinary Tract: No masses identified. No evidence of hydronephrosis. Stomach/Bowel: Visualized portions within the abdomen are unremarkable. Vascular/Lymphatic: No pathologically enlarged lymph nodes identified. No abdominal aortic aneurysm demonstrated. Other:  No significant free fluid or fluid collections. Musculoskeletal: No suspicious bone lesions identified. IMPRESSION: 1. Choledocholithiasis with small stone within the distal common bile duct. The common bile duct measures 7 mm. No significant intrahepatic bile duct dilatation. 2. Gallbladder sludge and stone within the gallbladder neck. Gallbladder wall thickness measures 3 mm. No significant pericholecystic fluid. Electronically Signed   By: Kimberley Penman M.D.   On: 12/08/2023 08:41   CT ABDOMEN PELVIS W CONTRAST Result Date: 12/07/2023 CLINICAL DATA:  Epigastric and mid abdominal pain with intermittent nausea EXAM: CT ABDOMEN AND PELVIS WITH CONTRAST TECHNIQUE: Multidetector CT imaging of the abdomen and pelvis was performed using the standard protocol following bolus administration of intravenous contrast. RADIATION DOSE REDUCTION: This exam was performed according to the departmental dose-optimization program which includes automated exposure control, adjustment of the mA and/or kV according to patient size and/or use of iterative  reconstruction technique. CONTRAST:  OMNIPAQUE  IOHEXOL  300 MG/ML  SOLN COMPARISON:  12/07/2023 FINDINGS: Lower chest: Scattered bibasilar subsegmental atelectasis. Normal heart size. No pericardial or pleural effusion. Hepatobiliary: No focal hepatic abnormality or intrahepatic biliary dilatation pattern. Normal hepatic contour. Hepatic and portal veins are patent. Mild gallbladder distension including the gallbladder neck, cystic duct, common hepatic duct, and common bile duct. This appears secondary to distal CBD mildly obstructing stone measuring 6 mm on coronal image 83/7 (choledocholithiasis). Additional nonobstructing gallstone in the gallbladder neck measures 7.7 mm, coronal image 88/7. Trace pericholecystic haziness and slight mucosal enhancement of the cystic duct and mildly dilated extrahepatic biliary tree which may be related to the bstructing distal CBD stone but difficult to exclude associated mild cholangitis/cholecystitis. Pancreas: Unremarkable. No pancreatic ductal dilatation or surrounding inflammatory changes. Spleen: Normal in size without focal abnormality. Adrenals/Urinary Tract: Adrenal glands are unremarkable. Kidneys are normal, without renal calculi, focal lesion, or hydronephrosis. Bladder is unremarkable. Stomach/Bowel: Negative for bowel obstruction, significant dilatation, ileus, or free air. Appendix not visualized.  Minor scattered colonic diverticulosis without acute inflammatory process. No free fluid, fluid collection, hemorrhage, hematoma, abscess or ascites. Vascular/Lymphatic: Minor aortoiliac atherosclerosis and tortuosity. No acute vascular process. No veno-occlusive finding. No adenopathy. Reproductive: Prostate enlargement with coarse calcifications. Other: No abdominal wall hernia or abnormality. No abdominopelvic ascites. Musculoskeletal: Degenerative changes throughout the spine with mild associated dextroscoliosis. No acute osseous finding. IMPRESSION: 1. 6 mm  distal CBD mildly obstructing stone (choledocholithiasis). 2. Additional nonobstructing 7.7 mm gallstone in the gallbladder neck. 3. Trace pericholecystic haziness and slight mucosal enhancement of the mildly dilated cystic duct and dilated extrahepatic biliary tree which may be related to the obstructing distal CBD stone but difficult to exclude associated mild cholangitis/cholecystitis. 4. Minor colonic diverticulosis without acute inflammatory process. Aortic Atherosclerosis (ICD10-I70.0). Electronically Signed   By: Melven Stable.  Shick M.D.   On: 12/07/2023 15:01   US  ABDOMEN LIMITED RUQ (LIVER/GB) Result Date: 12/07/2023 CLINICAL DATA:  [id number] Transaminitis [id number], mid abdominal pain EXAM: ULTRASOUND ABDOMEN LIMITED RIGHT UPPER QUADRANT COMPARISON:  None Available. FINDINGS: Gallbladder: Mild gallbladder distension with a thickened wall measuring 7 mm. Hypoechoic intraluminal sludge in the gallbladder body and neck. Subcentimeter gallstones suspected in the gallbladder neck measuring up to 8 mm. No pericholecystic fluid. No Murphy's sign elicited during the exam. Common bile duct: Diameter: Unable to visualize accurately because of bowel gas and high position of the liver. Liver: Mild increased echogenicity, nonspecific. No large focal hepatic abnormality. No intrahepatic biliary dilatation. Normal liver contour. No surrounding free fluid or ascites. Portal vein is patent on color Doppler imaging with normal direction of blood flow towards the liver. Other: No free fluid. IMPRESSION: 1. Mild gallbladder distension with sludge and subcentimeter cholelithiasis. Thickened gallbladder wall. Negative Murphys sign. Appearance remains nonspecific. 2. Nonvisualized common bile duct. Electronically Signed   By: Melven Stable.  Shick M.D.   On: 12/07/2023 14:40   DG Foot Complete Left Result Date: 12/07/2023 CLINICAL DATA:  leg swelling EXAM: LEFT FOOT - COMPLETE 3+ VIEW COMPARISON:  None Available. FINDINGS: There is no evidence of  fracture or dislocation. Mild dorsal spurring in the midfoot. There is no evidence of arthropathy or other focal bone abnormality. Calcaneal spur. Soft tissues are unremarkable. IMPRESSION: 1. No acute findings. 2. Mild dorsal spurring in the midfoot. Electronically Signed   By: Nicoletta Barrier M.D.   On: 12/07/2023 12:27   DG Chest Portable 1 View Result Date: 12/07/2023 CLINICAL DATA:  Epigastric pain EXAM: PORTABLE CHEST - 1 VIEW COMPARISON:  05/25/2021 FINDINGS: Low lung volumes with linear atelectasis in both lung bases. No overt edema. No pneumothorax. Heart size and mediastinal contours are within normal limits. No effusion. Advanced left glenohumeral DJD. IMPRESSION: Low volumes with bibasilar atelectasis. Electronically Signed   By: Nicoletta Barrier M.D.   On: 12/07/2023 12:26   US  Venous Img Lower Unilateral Left (DVT) Result Date: 12/07/2023 CLINICAL DATA:  Calf swelling. 2-3 weeks. On Eliquis  for atrial fibrillation EXAM: Left LOWER EXTREMITY VENOUS DOPPLER ULTRASOUND TECHNIQUE: Gray-scale sonography with graded compression, as well as color Doppler and duplex ultrasound were performed to evaluate the lower extremity deep venous systems from the level of the common femoral vein and including the common femoral, femoral, profunda femoral, popliteal and calf veins including the posterior tibial, peroneal and gastrocnemius veins when visible. The superficial great saphenous vein was also interrogated. Spectral Doppler was utilized to evaluate flow at rest and with distal augmentation maneuvers in the common femoral, femoral and popliteal veins. COMPARISON:  None Available. FINDINGS: Contralateral Common Femoral  Vein: Respiratory phasicity is normal and symmetric with the symptomatic side. No evidence of thrombus. Normal compressibility. Common Femoral Vein: No evidence of thrombus. Normal compressibility, respiratory phasicity and response to augmentation. Saphenofemoral Junction: No evidence of thrombus. Normal  compressibility and flow on color Doppler imaging. Profunda Femoral Vein: No evidence of thrombus. Normal compressibility and flow on color Doppler imaging. Femoral Vein: No evidence of thrombus. Normal compressibility, respiratory phasicity and response to augmentation. Popliteal Vein: No evidence of thrombus. Normal compressibility, respiratory phasicity and response to augmentation. Calf Veins: No evidence of thrombus. Normal compressibility and flow on color Doppler imaging. Superficial Great Saphenous Vein: No evidence of thrombus. Normal compressibility. Venous Reflux:  None. Other Findings:  None. IMPRESSION: No evidence of left lower extremity DVT. Electronically Signed   By: Adrianna Horde M.D.   On: 12/07/2023 12:18    Review of Systems  All other systems reviewed and are negative.  Blood pressure 123/73, pulse 66, temperature 98.6 F (37 C), temperature source Oral, resp. rate 18, height 5\' 9"  (1.753 m), weight 88.4 kg, SpO2 98%. Physical Exam Vitals reviewed.  HENT:     Head: Normocephalic.  Cardiovascular:     Rate and Rhythm: Rhythm irregular.  Abdominal:     Palpations: Abdomen is soft.     Tenderness: There is no abdominal tenderness.  Skin:    General: Skin is warm.  Neurological:     General: No focal deficit present.     Mental Status: He is alert.     Assessment/Plan: Choledocholithiasis/cholelithiasis-patient will require laparoscopic cholecystectomy during this admission.  Will follow-up tomorrow after ERCP.  He is pain-free currently.  Rosario Kushner A Ellenore Roscoe 12/08/2023, 10:52 AM    High complexity

## 2023-12-09 ENCOUNTER — Inpatient Hospital Stay (HOSPITAL_COMMUNITY)

## 2023-12-09 ENCOUNTER — Encounter (HOSPITAL_COMMUNITY)
Admission: EM | Disposition: A | Discharge status: Home / Self Care | Payer: Self-pay | Source: Home / Self Care | Attending: Internal Medicine

## 2023-12-09 ENCOUNTER — Inpatient Hospital Stay (HOSPITAL_COMMUNITY): Admitting: Anesthesiology

## 2023-12-09 ENCOUNTER — Encounter (HOSPITAL_COMMUNITY): Payer: Self-pay | Admitting: Internal Medicine

## 2023-12-09 DIAGNOSIS — K8051 Calculus of bile duct without cholangitis or cholecystitis with obstruction: Secondary | ICD-10-CM | POA: Diagnosis not present

## 2023-12-09 DIAGNOSIS — I480 Paroxysmal atrial fibrillation: Secondary | ICD-10-CM

## 2023-12-09 DIAGNOSIS — K8050 Calculus of bile duct without cholangitis or cholecystitis without obstruction: Secondary | ICD-10-CM

## 2023-12-09 DIAGNOSIS — I10 Essential (primary) hypertension: Secondary | ICD-10-CM | POA: Diagnosis not present

## 2023-12-09 DIAGNOSIS — E785 Hyperlipidemia, unspecified: Secondary | ICD-10-CM | POA: Diagnosis not present

## 2023-12-09 HISTORY — PX: ERCP: SHX5425

## 2023-12-09 LAB — PHOSPHORUS: Phosphorus: 2.9 mg/dL (ref 2.5–4.6)

## 2023-12-09 LAB — COMPREHENSIVE METABOLIC PANEL WITH GFR
ALT: 719 U/L — ABNORMAL HIGH (ref 0–44)
AST: 362 U/L — ABNORMAL HIGH (ref 15–41)
Albumin: 3.1 g/dL — ABNORMAL LOW (ref 3.5–5.0)
Alkaline Phosphatase: 210 U/L — ABNORMAL HIGH (ref 38–126)
Anion gap: 8 (ref 5–15)
BUN: 19 mg/dL (ref 8–23)
CO2: 21 mmol/L — ABNORMAL LOW (ref 22–32)
Calcium: 8.6 mg/dL — ABNORMAL LOW (ref 8.9–10.3)
Chloride: 106 mmol/L (ref 98–111)
Creatinine, Ser: 1 mg/dL (ref 0.61–1.24)
GFR, Estimated: >60 mL/min (ref >60)
Glucose, Bld: 114 mg/dL — ABNORMAL HIGH (ref 70–99)
Potassium: 3.6 mmol/L (ref 3.5–5.1)
Sodium: 135 mmol/L (ref 135–145)
Total Bilirubin: 6.5 mg/dL — ABNORMAL HIGH (ref 0.0–1.2)
Total Protein: 5.6 g/dL — ABNORMAL LOW (ref 6.5–8.1)

## 2023-12-09 LAB — CBC
HCT: 41.6 % (ref 39.0–52.0)
Hemoglobin: 13.7 g/dL (ref 13.0–17.0)
MCH: 31.5 pg (ref 26.0–34.0)
MCHC: 32.9 g/dL (ref 30.0–36.0)
MCV: 95.6 fL (ref 80.0–100.0)
Platelets: 207 10*3/uL (ref 150–400)
RBC: 4.35 MIL/uL (ref 4.22–5.81)
RDW: 13.3 % (ref 11.5–15.5)
WBC: 6.3 10*3/uL (ref 4.0–10.5)
nRBC: 0 % (ref 0.0–0.2)

## 2023-12-09 LAB — MAGNESIUM: Magnesium: 2 mg/dL (ref 1.7–2.4)

## 2023-12-09 SURGERY — ERCP, WITH INTERVENTION IF INDICATED
Anesthesia: General

## 2023-12-09 MED ORDER — LIDOCAINE 2% (20 MG/ML) 5 ML SYRINGE
INTRAMUSCULAR | Status: DC | PRN
  Administered 2023-12-09: 50 mg via INTRAVENOUS

## 2023-12-09 MED ORDER — FENTANYL CITRATE (PF) 100 MCG/2ML IJ SOLN
INTRAMUSCULAR | Status: AC
  Filled 2023-12-09: qty 2

## 2023-12-09 MED ORDER — PROPOFOL 10 MG/ML IV BOLUS
INTRAVENOUS | Status: DC | PRN
  Administered 2023-12-09: 150 mg via INTRAVENOUS

## 2023-12-09 MED ORDER — SODIUM CHLORIDE 0.9% FLUSH
3.0000 mL | Freq: Two times a day (BID) | INTRAVENOUS | Status: DC
  Administered 2023-12-10 – 2023-12-12 (×3): 10 mL via INTRAVENOUS

## 2023-12-09 MED ORDER — ROCURONIUM BROMIDE 100 MG/10ML IV SOLN
INTRAVENOUS | Status: DC | PRN
  Administered 2023-12-09: 30 mg via INTRAVENOUS

## 2023-12-09 MED ORDER — PHENYLEPHRINE HCL (PRESSORS) 10 MG/ML IV SOLN
INTRAVENOUS | Status: AC
  Filled 2023-12-09: qty 1

## 2023-12-09 MED ORDER — PROPOFOL 10 MG/ML IV BOLUS
INTRAVENOUS | Status: AC
Start: 2023-12-09 — End: ?
  Filled 2023-12-09: qty 20

## 2023-12-09 MED ORDER — LACTATED RINGERS IV SOLN: INTRAVENOUS | Status: DC | PRN

## 2023-12-09 MED ORDER — DICLOFENAC SUPPOSITORY 100 MG
RECTAL | Status: DC | PRN
  Administered 2023-12-09: 100 mg via RECTAL

## 2023-12-09 MED ORDER — DICLOFENAC SUPPOSITORY 100 MG
RECTAL | Status: AC
  Filled 2023-12-09: qty 1

## 2023-12-09 MED ORDER — SUGAMMADEX SODIUM 200 MG/2ML IV SOLN
INTRAVENOUS | Status: DC | PRN
  Administered 2023-12-09: 180 mg via INTRAVENOUS

## 2023-12-09 MED ORDER — SUCCINYLCHOLINE CHLORIDE 200 MG/10ML IV SOSY
PREFILLED_SYRINGE | INTRAVENOUS | Status: DC | PRN
  Administered 2023-12-09: 100 mg via INTRAVENOUS

## 2023-12-09 MED ORDER — FENTANYL CITRATE (PF) 100 MCG/2ML IJ SOLN
INTRAMUSCULAR | Status: DC | PRN
  Administered 2023-12-09: 50 ug via INTRAVENOUS

## 2023-12-09 MED ORDER — GLUCAGON HCL RDNA (DIAGNOSTIC) 1 MG IJ SOLR
INTRAMUSCULAR | Status: AC
  Filled 2023-12-09: qty 1

## 2023-12-09 MED ORDER — PHENYLEPHRINE HCL (PRESSORS) 10 MG/ML IV SOLN
INTRAVENOUS | Status: DC | PRN
  Administered 2023-12-09 (×2): 80 ug via INTRAVENOUS

## 2023-12-09 MED ORDER — SODIUM CHLORIDE 0.9% FLUSH: 3.0000 mL | INTRAVENOUS | Status: DC | PRN

## 2023-12-09 MED ORDER — LIDOCAINE HCL (CARDIAC) PF 100 MG/5ML IV SOSY
PREFILLED_SYRINGE | INTRAVENOUS | Status: DC | PRN
  Administered 2023-12-09: 50 mg via INTRAVENOUS

## 2023-12-09 MED ORDER — ONDANSETRON HCL 4 MG/2ML IJ SOLN
INTRAMUSCULAR | Status: DC | PRN
  Administered 2023-12-09: 4 mg via INTRAVENOUS

## 2023-12-09 NOTE — Interval H&P Note (Signed)
 History and Physical Interval Note: 78 year old male, A-fib on Eliquis , last dose on 12/07/2023 with epigastric pain, nausea, elevated LFTs, abnormal CAT scan showing small distal common bile duct stone and gallstones for ERCP with propofol .  12/09/2023 12:18 PM  Jeffrey Contreras  has presented today for A-fib on Eliquis , last dose on 12/07/2023 with epigastric pain, nausea, elevated LFTs, abnormal CAT scan showing small distal common bile duct stone and gallstones, with the diagnosis of Choledocholithiasis.  The various methods of treatment have been discussed with the patient and family. After consideration of risks, benefits and other options for treatment, the patient has consented to  Procedure(s): ERCP, WITH INTERVENTION IF INDICATED (N/A) as a surgical intervention.  The patient's history has been reviewed, patient examined, no change in status, stable for surgery.  I have reviewed the patient's chart and labs.  Questions were answered to the patient's satisfaction.     Jeffrey Contreras

## 2023-12-09 NOTE — Progress Notes (Signed)
 He PROGRESS NOTE  Jeffrey Contreras YQM:[id number] DOB: 09/24/1945 DOA: 12/07/2023 PCP: Luther Saltness, MD   LOS: 2 days   Brief Narrative / Interim history: 79 year old male with history of PAF on Eliquis , BPPV, HTN who comes into the hospital with abdominal pain.  He experienced this pain mainly in the epigastric area, dull, and given persistence throughout the night decided come to the emergency room.  No nausea, vomiting, diarrhea, no fever or chills.  In the ED he was found to have significantly elevated LFTs as well as choledocholithiasis and cholelithiasis.  Subjective / 24h Interval events: Feeling well this morning, no abdominal discomfort, no nausea or vomiting.  Has been tolerating a diet well without issues.  Wife is at bedside, she noticed that he is looking more icteric  Assesement and Plan: Principal Problem:   Choledocholithiasis with obstruction and transaminitis Active Problems:   Great toe pain, left   QT prolongation   Paroxysmal atrial fibrillation (HCC)   Essential hypertension   Hyperlipidemia  Principal problem Choledocholithiasis, cholelithiasis -based on initial imaging as well as MRI/MRCP.  Gastroenterology as well as general surgery consulted.  He is on anticoagulation at home, but has not taken for 2 days now. - He is scheduled for ERCP today Active problems PAF-he is in sinus right now, continue to hold anticoagulation for future ERCP and potential cholecystectomy  Essential hypertension-continue HCTZ, spironolactone , ARB, blood pressure is acceptable this morning  Hyperlipidemia-hold statin with elevated LFTs  Elevated LFTs-due to #1, AST/ALT are improving however bilirubin is increasing  Scheduled Meds:  brimonidine   1 drop Both Eyes BID   And   timolol   1 drop Both Eyes BID   dorzolamide   1 drop Left Eye TID   hydrochlorothiazide   12.5 mg Oral Daily   irbesartan  75 mg Oral Daily   spironolactone   25 mg Oral Daily   Continuous  Infusions:  cefTRIAXone (ROCEPHIN)  IV     PRN Meds:.morphine injection, traMADol  Current Outpatient Medications  Medication Instructions   atorvastatin  (LIPITOR) 40 mg, Oral, Daily   benzonatate  (TESSALON ) 100 mg, Oral, 3 times daily PRN   brimonidine -timolol  (COMBIGAN ) 0.2-0.5 % ophthalmic solution 1 drop, 2 times daily   diltiazem  (CARDIZEM ) 30 mg, Oral, Every 4 hours PRN   dorzolamide  (TRUSOPT ) 2 % ophthalmic solution 1 drop, 3 times daily   Eliquis  5 mg, Oral, 2 times daily   eplerenone  (INSPRA ) 25 mg, Oral, Daily   fexofenadine (ALLEGRA) 180 mg, Oral, Daily   fluticasone (FLONASE) 50 MCG/ACT nasal spray 1 spray, Each Nare, Daily   furosemide  (LASIX ) 20 mg, Oral, Daily   hydrochlorothiazide  (MICROZIDE ) 12.5 mg, Oral, Daily   Misc Natural Products (PUMPKIN SEED OIL) CAPS 1 capsule, 2 times daily   telmisartan  (MICARDIS ) 40 mg, Oral, Daily    Diet Orders (From admission, onward)     Start     Ordered   12/09/23 0001  Diet NPO time specified  Diet effective midnight        12/08/23 0928            DVT prophylaxis: Place TED hose Start: 12/07/23 2003   Lab Results  Component Value Date   PLT 207 12/09/2023      Code Status: Full Code  Family Communication: Wife at bedside  Status is: Inpatient Remains inpatient appropriate because: Severity of illness   Level of care: Telemetry  Consultants:  Gastroenterology General Surgery  Objective: Vitals:   12/08/23 1100 12/08/23 1625 12/08/23 1925 12/09/23 0455  BP: 121/72 114/71 126/72 125/73  Pulse:  (!) 58 (!) 58 65  Resp:  18 15 18   Temp:  98 F (36.7 C) 97.7 F (36.5 C) 98.6 F (37 C)  TempSrc:  Oral Oral Oral  SpO2: 100% 97% 98% 94%  Weight:      Height:        Intake/Output Summary (Last 24 hours) at 12/09/2023 1032 Last data filed at 12/08/2023 1745 Gross per 24 hour  Intake 600 ml  Output --  Net 600 ml   Wt Readings from Last 3 Encounters:  12/07/23 88.4 kg  11/25/23 91.6 kg  05/16/23  87.1 kg    Examination:  Constitutional: NAD Eyes: lids and conjunctivae normal, + scleral icterus ENMT: mmm Neck: normal, supple Respiratory: clear to auscultation bilaterally, no wheezing, no crackles. Normal respiratory effort.  Cardiovascular: Regular rate and rhythm, no murmurs / rubs / gallops. No LE edema. Abdomen: soft, no distention, no tenderness. Bowel sounds positive.    Data Reviewed: I have independently reviewed following labs and imaging studies   CBC Recent Labs  Lab 12/07/23 1054 12/08/23 0820 12/09/23 0521  WBC 6.2 6.3 6.3  HGB 15.4 14.2 13.7  HCT 45.5 42.1 41.6  PLT 274 222 207  MCV 91.2 94.0 95.6  MCH 30.9 31.7 31.5  MCHC 33.8 33.7 32.9  RDW 13.2 13.3 13.3    Recent Labs  Lab 12/07/23 1054 12/07/23 1336 12/07/23 2048 12/08/23 0820 12/09/23 0521  NA 138  --   --  138 135  K 3.6  --   --  3.6 3.6  CL 101  --   --  106 106  CO2 19*  --   --  22 21*  GLUCOSE 181*  --   --  112* 114*  BUN 24*  --   --  22 19  CREATININE 1.13  --   --  1.03 1.00  CALCIUM  9.7  --   --  8.8* 8.6*  AST 1,147*  --   --  592* 362*  ALT 1,013*  --   --  925* 719*  ALKPHOS 183*  --   --  186* 210*  BILITOT 2.5*  --   --  4.1* 6.5*  ALBUMIN 4.1  --   --  3.3* 3.1*  MG  --   --  2.1  --  2.0  INR  --  1.2  --  1.2  --     ------------------------------------------------------------------------------------------------------------------ No results for input(s): "CHOL", "HDL", "LDLCALC", "TRIG", "CHOLHDL", "LDLDIRECT" in the last 72 hours.  Lab Results  Component Value Date   HGBA1C 5.5 09/16/2019   ------------------------------------------------------------------------------------------------------------------ No results for input(s): "TSH", "T4TOTAL", "T3FREE", "THYROIDAB" in the last 72 hours.  Invalid input(s): "FREET3"  Cardiac Enzymes No results for input(s): "CKMB", "TROPONINI", "MYOGLOBIN" in the last 168 hours.  Invalid input(s):  "CK" ------------------------------------------------------------------------------------------------------------------    Component Value Date/Time   BNP 168.6 (H) 09/15/2019 2308    CBG: No results for input(s): "GLUCAP" in the last 168 hours.  No results found for this or any previous visit (from the past 240 hours).   Radiology Studies: No results found.    Kathlen Para, MD, PhD [facility]  Between 7 am - 7 pm I am available, please contact me via Amion (for emergencies) or Securechat (non urgent messages)  Between 7 pm - 7 am I am not available, please contact night coverage MD/APP via Amion

## 2023-12-09 NOTE — Plan of Care (Addendum)
 VSS. Patient given PRN Tramadol for pain. NPO since midnight for ERCP. No acute events overnight.  Problem: Education: Goal: Knowledge of General Education information will improve Description: Including pain rating scale, medication(s)/side effects and non-pharmacologic comfort measures Outcome: Progressing   Problem: Clinical Measurements: Goal: Ability to maintain clinical measurements within normal limits will improve Outcome: Progressing Goal: Will remain free from infection Outcome: Progressing   Problem: Pain Managment: Goal: General experience of comfort will improve and/or be controlled Outcome: Progressing   Problem: Safety: Goal: Ability to remain free from injury will improve Outcome: Progressing

## 2023-12-09 NOTE — Anesthesia Preprocedure Evaluation (Signed)
 Anesthesia Evaluation  Patient identified by MRN, date of birth, ID band Patient awake    Reviewed: Allergy & Precautions, NPO status , Patient's Chart, lab work & pertinent test results  Airway Mallampati: II  TM Distance: >3 FB Neck ROM: Full    Dental  (+) Dental Advisory Given   Pulmonary neg pulmonary ROS   breath sounds clear to auscultation       Cardiovascular hypertension, Pt. on medications + dysrhythmias Atrial Fibrillation  Rhythm:Regular Rate:Normal     Neuro/Psych negative neurological ROS     GI/Hepatic negative GI ROS,,,Choledocolithiasis   Endo/Other  diabetes    Renal/GU Renal disease     Musculoskeletal   Abdominal   Peds  Hematology negative hematology ROS (+)   Anesthesia Other Findings   Reproductive/Obstetrics                             Anesthesia Physical Anesthesia Plan  ASA: 2  Anesthesia Plan: General   Post-op Pain Management: Minimal or no pain anticipated   Induction: Intravenous  PONV Risk Score and Plan: 2 and Dexamethasone  and Treatment may vary due to age or medical condition  Airway Management Planned: Oral ETT  Additional Equipment:   Intra-op Plan:   Post-operative Plan: Extubation in OR  Informed Consent: I have reviewed the patients History and Physical, chart, labs and discussed the procedure including the risks, benefits and alternatives for the proposed anesthesia with the patient or authorized representative who has indicated his/her understanding and acceptance.     Dental advisory given  Plan Discussed with: CRNA  Anesthesia Plan Comments:        Anesthesia Quick Evaluation

## 2023-12-09 NOTE — Op Note (Signed)
 [facility] Patient Name: Jeffrey Contreras Procedure Date: 12/09/2023 MRN: [id number] Attending MD: Genell Ken , MD, [id number] Date of Birth: [birth year] CSN: [id number] Age: 78 Admit Type: Inpatient Procedure:                ERCP Indications:              Common bile duct stone(s), Abnormal MRCP Providers:                Genell Ken, MD, Ila Malay, RN, Marylee Snowball, RN, Tyrus Gallus, Technician Referring MD:             [facility] Medicines:                Monitored Anesthesia Care Complications:            No immediate complications. Estimated Blood Loss:     Estimated blood loss: none. Procedure:                Pre-Anesthesia Assessment:                           - Prior to the procedure, a History and Physical                            was performed, and patient medications and                            allergies were reviewed. The patient's tolerance of                            previous anesthesia was also reviewed. The risks                            and benefits of the procedure and the sedation                            options and risks were discussed with the patient.                            All questions were answered, and informed consent                            was obtained. Prior Anticoagulants: The patient has                            taken no anticoagulant or antiplatelet agents. ASA                            Grade Assessment: II - A patient with mild systemic                            disease. After reviewing the risks and benefits,  the patient was deemed in satisfactory condition to                            undergo the procedure.                           After obtaining informed consent, the scope was                            passed under direct vision. Throughout the                            procedure, the patient's blood pressure, pulse, and                             oxygen saturations were monitored continuously. The                            [id number]-Q190V ([id number] Olympus duodenoscope was                            introduced through the mouth, and used to inject                            contrast into without successful cannulation. The                            ERCP was performed with difficulty due to                            challenging cannulation. The patient tolerated the                            procedure well. Scope In: Scope Out: Findings:      The scout film was normal. The esophagus was successfully intubated       under direct vision. The scope was advanced to a normal major papilla in       the descending duodenum without detailed examination of the pharynx,       larynx and associated structures, and upper GI tract. The upper GI tract       was grossly normal.      The ampulla appeared under a small diverticulum with a thick muscular       rim.      Despite multiple efforts, the CBD could not be canulated with a standard       or a small sphinctertome.      The pancreatic duct was not canulated or injected. Impression:               - Failed canulation. Moderate Sedation:      Patient did not receive moderate sedation for this procedure, but       instead received monitored anesthesia care. Recommendation:           - Repeat ERCP at appointment to be scheduled for  retreatment.                           - Clear liquid diet.                           - NPO post midnight. Procedure Code(s):        --- Professional ---                           [phone], Esophagogastroduodenoscopy, flexible,                            transoral; diagnostic, including collection of                            specimen(s) by brushing or washing, when performed                            (separate procedure) Diagnosis Code(s):        --- Professional ---                           K80.50, Calculus of bile duct  without cholangitis                            or cholecystitis without obstruction                           R93.2, Abnormal findings on diagnostic imaging of                            liver and biliary tract CPT copyright 2022 [employer]. All rights reserved. The codes documented in this report are preliminary and upon coder review may  be revised to meet current compliance requirements. Genell Ken, MD 12/09/2023 2:33:04 PM This report has been signed electronically. Number of Addenda: 0

## 2023-12-09 NOTE — Anesthesia Procedure Notes (Signed)
 Procedure Name: Intubation Date/Time: 12/09/2023 1:16 PM  Performed by: Darlena Ego, CRNAPre-anesthesia Checklist: Patient identified, Emergency Drugs available, Suction available and Patient being monitored Patient Re-evaluated:Patient Re-evaluated prior to induction Oxygen Delivery Method: Circle System Utilized Preoxygenation: Pre-oxygenation with 100% oxygen Induction Type: IV induction Ventilation: Mask ventilation without difficulty Laryngoscope Size: Miller and 2 Grade View: Grade II Tube type: Oral Tube size: 7.5 mm Number of attempts: 1 Airway Equipment and Method: Stylet and Oral airway Placement Confirmation: ETT inserted through vocal cords under direct vision, positive ETCO2 and breath sounds checked- equal and bilateral Tube secured with: Tape Dental Injury: Teeth and Oropharynx as per pre-operative assessment

## 2023-12-09 NOTE — Progress Notes (Signed)
 [facility] Progress Note     Subjective: CC:  Had some epigastric pain overnight, resolved this AM. Denies vomiting. Denies urinary sxs but confirms dark urine. +Flatus. Had a BM yesterday.  Denies a history of abdominal surgery. Says he is retired. Denies tobacco use, rare EtOH.   Objective: Vital signs in last 24 hours: Temp:  [97.7 F (36.5 C)-98.6 F (37 C)] 98.6 F (37 C) (04/28 0455) Pulse Rate:  [58-65] 65 (04/28 0455) Resp:  [15-18] 18 (04/28 0455) BP: (114-126)/(71-73) 125/73 (04/28 0455) SpO2:  [94 %-100 %] 94 % (04/28 0455) Last BM Date : 12/07/23  Intake/Output from previous day: 04/27 0701 - 04/28 0700 In: 600 [P.O.:600] Out: -  Intake/Output this shift: No intake/output data recorded.  PE: Gen:  Alert, NAD, pleasant and cooperative  Card:  Regular rate and rhythm  Pulm:  Normal effort, clear to auscultation bilaterally Abd: Soft, protuberant, non-tender, non-distended, no palpable hernias or masses. Skin: warm and dry, no rashes  Psych: A&Ox3   Lab Results:  Recent Labs    12/08/23 0820 12/09/23 0521  WBC 6.3 6.3  HGB 14.2 13.7  HCT 42.1 41.6  PLT 222 207   BMET Recent Labs    12/08/23 0820 12/09/23 0521  NA 138 135  K 3.6 3.6  CL 106 106  CO2 22 21*  GLUCOSE 112* 114*  BUN 22 19  CREATININE 1.03 1.00  CALCIUM  8.8* 8.6*   PT/INR Recent Labs    12/07/23 1336 12/08/23 0820  LABPROT 15.2 15.1  INR 1.2 1.2   CMP     Component Value Date/Time   NA 135 12/09/2023 0521   NA 140 05/16/2023 1217   K 3.6 12/09/2023 0521   CL 106 12/09/2023 0521   CO2 21 (L) 12/09/2023 0521   GLUCOSE 114 (H) 12/09/2023 0521   BUN 19 12/09/2023 0521   BUN 20 05/16/2023 1217   CREATININE 1.00 12/09/2023 0521   CALCIUM  8.6 (L) 12/09/2023 0521   PROT 5.6 (L) 12/09/2023 0521   ALBUMIN 3.1 (L) 12/09/2023 0521   AST 362 (H) 12/09/2023 0521   ALT 719 (H) 12/09/2023 0521   ALKPHOS 210 (H) 12/09/2023 0521   BILITOT 6.5 (H) 12/09/2023 0521    GFRNONAA >60 12/09/2023 0521   GFRAA 97 01/20/2020 1116   Lipase     Component Value Date/Time   LIPASE 55 (H) 12/08/2023 0820       Studies/Results: MR ABDOMEN MRCP W WO CONTAST Result Date: 12/08/2023 CLINICAL DATA:  Evaluate for biliary obstruction.  Gallstones. EXAM: MRI ABDOMEN WITHOUT AND WITH CONTRAST (INCLUDING MRCP) TECHNIQUE: Multiplanar multisequence MR imaging of the abdomen was performed both before and after the administration of intravenous contrast. Heavily T2-weighted images of the biliary and pancreatic ducts were obtained, and three-dimensional MRCP images were rendered by post processing. CONTRAST:  10mL GADAVIST GADOBUTROL 1 MMOL/ML IV SOLN COMPARISON:  CT 12/07/2023 and U/S abdomen limited 12/07/2023. FINDINGS: Lower chest: No acute findings. Hepatobiliary: No mass or other parenchymal abnormality identified. Gallbladder sludge identified. Stone within the gallbladder neck is again seen measuring 6 mm. Gallbladder wall thickness measures 3 mm. No significant pericholecystic fluid. The common bile duct measures 7 mm. Again seen is a small stone within the distal common bile duct, which measured 6 mm on the CT from prior day, image 10 of series 9. No significant intrahepatic bile duct dilatation. Pancreas: No mass, inflammatory changes, or other parenchymal abnormality identified. Spleen:  Within normal limits in size and appearance.  Adrenals/Urinary Tract: No masses identified. No evidence of hydronephrosis. Stomach/Bowel: Visualized portions within the abdomen are unremarkable. Vascular/Lymphatic: No pathologically enlarged lymph nodes identified. No abdominal aortic aneurysm demonstrated. Other:  No significant free fluid or fluid collections. Musculoskeletal: No suspicious bone lesions identified. IMPRESSION: 1. Choledocholithiasis with small stone within the distal common bile duct. The common bile duct measures 7 mm. No significant intrahepatic bile duct dilatation. 2.  Gallbladder sludge and stone within the gallbladder neck. Gallbladder wall thickness measures 3 mm. No significant pericholecystic fluid. Electronically Signed   By: Kimberley Penman M.D.   On: 12/08/2023 08:41   CT ABDOMEN PELVIS W CONTRAST Result Date: 12/07/2023 CLINICAL DATA:  Epigastric and mid abdominal pain with intermittent nausea EXAM: CT ABDOMEN AND PELVIS WITH CONTRAST TECHNIQUE: Multidetector CT imaging of the abdomen and pelvis was performed using the standard protocol following bolus administration of intravenous contrast. RADIATION DOSE REDUCTION: This exam was performed according to the departmental dose-optimization program which includes automated exposure control, adjustment of the mA and/or kV according to patient size and/or use of iterative reconstruction technique. CONTRAST:  OMNIPAQUE  IOHEXOL  300 MG/ML  SOLN COMPARISON:  12/07/2023 FINDINGS: Lower chest: Scattered bibasilar subsegmental atelectasis. Normal heart size. No pericardial or pleural effusion. Hepatobiliary: No focal hepatic abnormality or intrahepatic biliary dilatation pattern. Normal hepatic contour. Hepatic and portal veins are patent. Mild gallbladder distension including the gallbladder neck, cystic duct, common hepatic duct, and common bile duct. This appears secondary to distal CBD mildly obstructing stone measuring 6 mm on coronal image 83/7 (choledocholithiasis). Additional nonobstructing gallstone in the gallbladder neck measures 7.7 mm, coronal image 88/7. Trace pericholecystic haziness and slight mucosal enhancement of the cystic duct and mildly dilated extrahepatic biliary tree which may be related to the bstructing distal CBD stone but difficult to exclude associated mild cholangitis/cholecystitis. Pancreas: Unremarkable. No pancreatic ductal dilatation or surrounding inflammatory changes. Spleen: Normal in size without focal abnormality. Adrenals/Urinary Tract: Adrenal glands are unremarkable. Kidneys are  normal, without renal calculi, focal lesion, or hydronephrosis. Bladder is unremarkable. Stomach/Bowel: Negative for bowel obstruction, significant dilatation, ileus, or free air. Appendix not visualized. Minor scattered colonic diverticulosis without acute inflammatory process. No free fluid, fluid collection, hemorrhage, hematoma, abscess or ascites. Vascular/Lymphatic: Minor aortoiliac atherosclerosis and tortuosity. No acute vascular process. No veno-occlusive finding. No adenopathy. Reproductive: Prostate enlargement with coarse calcifications. Other: No abdominal wall hernia or abnormality. No abdominopelvic ascites. Musculoskeletal: Degenerative changes throughout the spine with mild associated dextroscoliosis. No acute osseous finding. IMPRESSION: 1. 6 mm distal CBD mildly obstructing stone (choledocholithiasis). 2. Additional nonobstructing 7.7 mm gallstone in the gallbladder neck. 3. Trace pericholecystic haziness and slight mucosal enhancement of the mildly dilated cystic duct and dilated extrahepatic biliary tree which may be related to the obstructing distal CBD stone but difficult to exclude associated mild cholangitis/cholecystitis. 4. Minor colonic diverticulosis without acute inflammatory process. Aortic Atherosclerosis (ICD10-I70.0). Electronically Signed   By: Melven Stable.  Shick M.D.   On: 12/07/2023 15:01   US  ABDOMEN LIMITED RUQ (LIVER/GB) Result Date: 12/07/2023 CLINICAL DATA:  [id number] Transaminitis [id number], mid abdominal pain EXAM: ULTRASOUND ABDOMEN LIMITED RIGHT UPPER QUADRANT COMPARISON:  None Available. FINDINGS: Gallbladder: Mild gallbladder distension with a thickened wall measuring 7 mm. Hypoechoic intraluminal sludge in the gallbladder body and neck. Subcentimeter gallstones suspected in the gallbladder neck measuring up to 8 mm. No pericholecystic fluid. No Murphy's sign elicited during the exam. Common bile duct: Diameter: Unable to visualize accurately because of bowel gas and high position  of the liver. Liver:  Mild increased echogenicity, nonspecific. No large focal hepatic abnormality. No intrahepatic biliary dilatation. Normal liver contour. No surrounding free fluid or ascites. Portal vein is patent on color Doppler imaging with normal direction of blood flow towards the liver. Other: No free fluid. IMPRESSION: 1. Mild gallbladder distension with sludge and subcentimeter cholelithiasis. Thickened gallbladder wall. Negative Murphys sign. Appearance remains nonspecific. 2. Nonvisualized common bile duct. Electronically Signed   By: Melven Stable.  Shick M.D.   On: 12/07/2023 14:40   DG Foot Complete Left Result Date: 12/07/2023 CLINICAL DATA:  leg swelling EXAM: LEFT FOOT - COMPLETE 3+ VIEW COMPARISON:  None Available. FINDINGS: There is no evidence of fracture or dislocation. Mild dorsal spurring in the midfoot. There is no evidence of arthropathy or other focal bone abnormality. Calcaneal spur. Soft tissues are unremarkable. IMPRESSION: 1. No acute findings. 2. Mild dorsal spurring in the midfoot. Electronically Signed   By: Nicoletta Barrier M.D.   On: 12/07/2023 12:27   DG Chest Portable 1 View Result Date: 12/07/2023 CLINICAL DATA:  Epigastric pain EXAM: PORTABLE CHEST - 1 VIEW COMPARISON:  05/25/2021 FINDINGS: Low lung volumes with linear atelectasis in both lung bases. No overt edema. No pneumothorax. Heart size and mediastinal contours are within normal limits. No effusion. Advanced left glenohumeral DJD. IMPRESSION: Low volumes with bibasilar atelectasis. Electronically Signed   By: Nicoletta Barrier M.D.   On: 12/07/2023 12:26   US  Venous Img Lower Unilateral Left (DVT) Result Date: 12/07/2023 CLINICAL DATA:  Calf swelling. 2-3 weeks. On Eliquis  for atrial fibrillation EXAM: Left LOWER EXTREMITY VENOUS DOPPLER ULTRASOUND TECHNIQUE: Gray-scale sonography with graded compression, as well as color Doppler and duplex ultrasound were performed to evaluate the lower extremity deep venous systems from the level of  the common femoral vein and including the common femoral, femoral, profunda femoral, popliteal and calf veins including the posterior tibial, peroneal and gastrocnemius veins when visible. The superficial great saphenous vein was also interrogated. Spectral Doppler was utilized to evaluate flow at rest and with distal augmentation maneuvers in the common femoral, femoral and popliteal veins. COMPARISON:  None Available. FINDINGS: Contralateral Common Femoral Vein: Respiratory phasicity is normal and symmetric with the symptomatic side. No evidence of thrombus. Normal compressibility. Common Femoral Vein: No evidence of thrombus. Normal compressibility, respiratory phasicity and response to augmentation. Saphenofemoral Junction: No evidence of thrombus. Normal compressibility and flow on color Doppler imaging. Profunda Femoral Vein: No evidence of thrombus. Normal compressibility and flow on color Doppler imaging. Femoral Vein: No evidence of thrombus. Normal compressibility, respiratory phasicity and response to augmentation. Popliteal Vein: No evidence of thrombus. Normal compressibility, respiratory phasicity and response to augmentation. Calf Veins: No evidence of thrombus. Normal compressibility and flow on color Doppler imaging. Superficial Great Saphenous Vein: No evidence of thrombus. Normal compressibility. Venous Reflux:  None. Other Findings:  None. IMPRESSION: No evidence of left lower extremity DVT. Electronically Signed   By: Adrianna Horde M.D.   On: 12/07/2023 12:18    Anti-infectives: Anti-infectives (From admission, onward)    Start     Dose/Rate Route Frequency Ordered Stop   12/09/23 0700  cefTRIAXone (ROCEPHIN) 2 g in sodium chloride  0.9 % 100 mL IVPB        2 g 200 mL/hr over 30 Minutes Intravenous  Once 12/08/23 0928            Latest Ref Rng & Units 12/09/2023    5:21 AM 12/08/2023    8:20 AM 12/07/2023   10:54 AM  Hepatic Function  Total Protein 6.5 - 8.1 g/dL 5.6  5.8  6.8    Albumin 3.5 - 5.0 g/dL 3.1  3.3  4.1   AST 15 - 41 U/L 362  592  1,147   ALT 0 - 44 U/L 719  925  1,013   Alk Phosphatase 38 - 126 U/L 210  186  183   Total Bilirubin 0.0 - 1.2 mg/dL 6.5  4.1  2.5     Assessment/Plan Choledocholithiasis  - Afebrile,  VSS - AST/ALT improving, alk phos/bilirubin uptrending as above  - WBC 6.3 - RUQ U/S 4/26 w/ cholelithiasis, 7 mm gallbladder wall. No pericholecystic fluid.  - CT abdomen and MRCP 4/26 show choledocholithiasis without significant intrahepatic biliary ductal dilation. Gallstones/sludge in gallbladder and gallbladder neck without signficant inflammatory changes.  - GI planning ERCP this afternoon. We will follow results of the endoscopy. Continue to hold Eliquis  in anticipation of lap chole this admission. Ok for a liquid diet after ERCP, NPO at [facility] for possible cholecystectomy tomorrow 4/29.   PAF- hx ablation; EKG 4/27 sinus rhythm, hold DOAC HTn HLD   LOS: 2 days   I reviewed nursing notes, Consultant GI notes, hospitalist notes, last 24 h vitals and pain scores, last 48 h intake and output, last 24 h labs and trends, and last 24 h imaging results.  This care required moderate level of medical decision making.   Michial Akin, PA-C [facility] Please see Amion for pager number during day hours 7:00am-4:30pm

## 2023-12-09 NOTE — Transfer of Care (Signed)
 Immediate Anesthesia Transfer of Care Note  Patient: Jeffrey Contreras  Procedure(s) Performed: ERCP, WITH INTERVENTION IF INDICATED  Patient Location: PACU and Endoscopy Unit  Anesthesia Type:General  Level of Consciousness: awake, alert , and oriented  Airway & Oxygen Therapy: Patient Spontanous Breathing and Patient connected to nasal cannula oxygen  Post-op Assessment: Report given to RN and Post -op Vital signs reviewed and stable  Post vital signs: Reviewed and stable  Last Vitals:  Vitals Value Taken Time  BP 120/54 12/09/23 1440  Temp 36.9 C 12/09/23 1440  Pulse 61 12/09/23 1443  Resp 15 12/09/23 1443  SpO2 100 % 12/09/23 1443  Vitals shown include unfiled device data.  Last Pain:  Vitals:   12/09/23 1440  TempSrc: Temporal  PainSc: Asleep         Complications: No notable events documented.

## 2023-12-09 NOTE — Anesthesia Preprocedure Evaluation (Addendum)
 Anesthesia Evaluation  Patient identified by MRN, date of birth, ID band Patient awake    Reviewed: Allergy & Precautions, NPO status , Patient's Chart, lab work & pertinent test results  History of Anesthesia Complications Negative for: history of anesthetic complications  Airway Mallampati: II  TM Distance: >3 FB Neck ROM: Full    Dental no notable dental hx. (+) Dental Advisory Given   Pulmonary neg pulmonary ROS   Pulmonary exam normal breath sounds clear to auscultation       Cardiovascular hypertension, Pt. on medications (-) angina (-) Past MI Normal cardiovascular exam+ dysrhythmias Atrial Fibrillation  Rhythm:Regular Rate:Normal     Neuro/Psych negative neurological ROS  negative psych ROS   GI/Hepatic negative GI ROS, Neg liver ROS,,,Choledocolithiasis   Endo/Other    Renal/GU Renal diseaseLab Results      Component                Value               Date                      NA                       135                 12/09/2023                CL                       106                 12/09/2023                K                        3.6                 12/09/2023                CO2                      21 (L)              12/09/2023                BUN                      19                  12/09/2023                CREATININE               1.00                12/09/2023                GFRNONAA                 >60                 12/09/2023                           Musculoskeletal   Abdominal   Peds  Hematology negative hematology ROS (+) Lab Results  Component                Value               Date                      WBC                      6.3                 12/09/2023                HGB                      13.7                12/09/2023                HCT                      41.6                12/09/2023                MCV                      95.6                12/09/2023                 PLT                      207                 12/09/2023              Anesthesia Other Findings CBD stone infusion  Reproductive/Obstetrics negative OB ROS                             Anesthesia Physical Anesthesia Plan  ASA: 2  Anesthesia Plan: General   Post-op Pain Management: Minimal or no pain anticipated   Induction: Intravenous  PONV Risk Score and Plan: 2 and Dexamethasone , Treatment may vary due to age or medical condition and Ondansetron   Airway Management Planned: Oral ETT  Additional Equipment:   Intra-op Plan:   Post-operative Plan: Extubation in OR  Informed Consent: I have reviewed the patients History and Physical, chart, labs and discussed the procedure including the risks, benefits and alternatives for the proposed anesthesia with the patient or authorized representative who has indicated his/her understanding and acceptance.     Dental advisory given  Plan Discussed with: CRNA and Surgeon  Anesthesia Plan Comments: (CBD stone, elevated LFTs, abnormal MRCP for ERCP)       Anesthesia Quick Evaluation

## 2023-12-10 ENCOUNTER — Encounter (HOSPITAL_COMMUNITY): Payer: Self-pay | Admitting: Internal Medicine

## 2023-12-10 ENCOUNTER — Inpatient Hospital Stay (HOSPITAL_COMMUNITY)

## 2023-12-10 ENCOUNTER — Inpatient Hospital Stay (HOSPITAL_COMMUNITY): Payer: Self-pay | Admitting: Anesthesiology

## 2023-12-10 ENCOUNTER — Encounter (HOSPITAL_COMMUNITY)
Admission: EM | Disposition: A | Discharge status: Home / Self Care | Payer: Self-pay | Source: Home / Self Care | Attending: Internal Medicine

## 2023-12-10 DIAGNOSIS — K8051 Calculus of bile duct without cholangitis or cholecystitis with obstruction: Secondary | ICD-10-CM | POA: Diagnosis not present

## 2023-12-10 DIAGNOSIS — K8050 Calculus of bile duct without cholangitis or cholecystitis without obstruction: Secondary | ICD-10-CM | POA: Diagnosis not present

## 2023-12-10 HISTORY — PX: STONE EXTRACTION WITH BASKET: SHX5318

## 2023-12-10 HISTORY — PX: SPHINCTEROTOMY: SHX5279

## 2023-12-10 HISTORY — PX: ERCP: SHX5425

## 2023-12-10 HISTORY — PX: PANCREATIC STENT PLACEMENT: SHX5539

## 2023-12-10 LAB — COMPREHENSIVE METABOLIC PANEL WITH GFR
ALT: 495 U/L — ABNORMAL HIGH (ref 0–44)
AST: 175 U/L — ABNORMAL HIGH (ref 15–41)
Albumin: 2.9 g/dL — ABNORMAL LOW (ref 3.5–5.0)
Alkaline Phosphatase: 231 U/L — ABNORMAL HIGH (ref 38–126)
Anion gap: 9 (ref 5–15)
BUN: 25 mg/dL — ABNORMAL HIGH (ref 8–23)
CO2: 22 mmol/L (ref 22–32)
Calcium: 8.5 mg/dL — ABNORMAL LOW (ref 8.9–10.3)
Chloride: 107 mmol/L (ref 98–111)
Creatinine, Ser: 1.33 mg/dL — ABNORMAL HIGH (ref 0.61–1.24)
GFR, Estimated: 54 mL/min — ABNORMAL LOW (ref >60)
Glucose, Bld: 88 mg/dL (ref 70–99)
Potassium: 3.6 mmol/L (ref 3.5–5.1)
Sodium: 138 mmol/L (ref 135–145)
Total Bilirubin: 2.5 mg/dL — ABNORMAL HIGH (ref 0.0–1.2)
Total Protein: 5.4 g/dL — ABNORMAL LOW (ref 6.5–8.1)

## 2023-12-10 LAB — CBC
HCT: 41.5 % (ref 39.0–52.0)
Hemoglobin: 13.3 g/dL (ref 13.0–17.0)
MCH: 30.9 pg (ref 26.0–34.0)
MCHC: 32 g/dL (ref 30.0–36.0)
MCV: 96.5 fL (ref 80.0–100.0)
Platelets: 198 10*3/uL (ref 150–400)
RBC: 4.3 MIL/uL (ref 4.22–5.81)
RDW: 13.2 % (ref 11.5–15.5)
WBC: 7 10*3/uL (ref 4.0–10.5)
nRBC: 0 % (ref 0.0–0.2)

## 2023-12-10 LAB — MAGNESIUM: Magnesium: 1.9 mg/dL (ref 1.7–2.4)

## 2023-12-10 SURGERY — ERCP, WITH INTERVENTION IF INDICATED
Anesthesia: General

## 2023-12-10 MED ORDER — FENTANYL CITRATE (PF) 100 MCG/2ML IJ SOLN
INTRAMUSCULAR | Status: DC | PRN
  Administered 2023-12-10: 50 ug via INTRAVENOUS

## 2023-12-10 MED ORDER — DEXAMETHASONE SODIUM PHOSPHATE 10 MG/ML IJ SOLN
INTRAMUSCULAR | Status: DC | PRN
  Administered 2023-12-10: 8 mg via INTRAVENOUS

## 2023-12-10 MED ORDER — ROCURONIUM BROMIDE 100 MG/10ML IV SOLN
INTRAVENOUS | Status: DC | PRN
Start: 2023-12-10 — End: 2023-12-10
  Administered 2023-12-10: 50 mg via INTRAVENOUS

## 2023-12-10 MED ORDER — SODIUM CHLORIDE 0.9 % IV SOLN
INTRAVENOUS | Status: DC | PRN
  Administered 2023-12-10: 25 mL

## 2023-12-10 MED ORDER — FENTANYL CITRATE (PF) 100 MCG/2ML IJ SOLN
INTRAMUSCULAR | Status: AC
  Filled 2023-12-10: qty 2

## 2023-12-10 MED ORDER — CIPROFLOXACIN IN D5W 400 MG/200ML IV SOLN
INTRAVENOUS | Status: AC
  Filled 2023-12-10: qty 200

## 2023-12-10 MED ORDER — DICLOFENAC SUPPOSITORY 100 MG
RECTAL | Status: DC | PRN
  Administered 2023-12-10: 100 mg via RECTAL

## 2023-12-10 MED ORDER — PROPOFOL 10 MG/ML IV BOLUS
INTRAVENOUS | Status: AC
  Filled 2023-12-10: qty 20

## 2023-12-10 MED ORDER — SODIUM CHLORIDE 0.9 % IV BOLUS
500.0000 mL | Freq: Once | INTRAVENOUS | Status: AC
  Administered 2023-12-10: 500 mL via INTRAVENOUS

## 2023-12-10 MED ORDER — CIPROFLOXACIN IN D5W 400 MG/200ML IV SOLN
INTRAVENOUS | Status: DC | PRN
  Administered 2023-12-10: 400 mg via INTRAVENOUS

## 2023-12-10 MED ORDER — LACTATED RINGERS IV SOLN
INTRAVENOUS | Status: DC | PRN
Start: 2023-12-10 — End: 2023-12-10

## 2023-12-10 MED ORDER — LIDOCAINE 2% (20 MG/ML) 5 ML SYRINGE
INTRAMUSCULAR | Status: DC | PRN
Start: 2023-12-10 — End: 2023-12-10
  Administered 2023-12-10: 80 mg via INTRAVENOUS

## 2023-12-10 MED ORDER — PHENYLEPHRINE 80 MCG/ML (10ML) SYRINGE FOR IV PUSH (FOR BLOOD PRESSURE SUPPORT)
PREFILLED_SYRINGE | INTRAVENOUS | Status: DC | PRN
Start: 2023-12-10 — End: 2023-12-10
  Administered 2023-12-10: 240 ug via INTRAVENOUS
  Administered 2023-12-10: 160 ug via INTRAVENOUS

## 2023-12-10 MED ORDER — EPHEDRINE SULFATE-NACL 50-0.9 MG/10ML-% IV SOSY
PREFILLED_SYRINGE | INTRAVENOUS | Status: DC | PRN
Start: 2023-12-10 — End: 2023-12-10
  Administered 2023-12-10: 10 mg via INTRAVENOUS

## 2023-12-10 MED ORDER — DICLOFENAC SUPPOSITORY 100 MG
RECTAL | Status: AC
  Filled 2023-12-10: qty 1

## 2023-12-10 MED ORDER — ONDANSETRON HCL 4 MG/2ML IJ SOLN
INTRAMUSCULAR | Status: DC | PRN
Start: 2023-12-10 — End: 2023-12-10
  Administered 2023-12-10: 4 mg via INTRAVENOUS

## 2023-12-10 MED ORDER — PROPOFOL 10 MG/ML IV BOLUS
INTRAVENOUS | Status: DC | PRN
  Administered 2023-12-10: 20 mg via INTRAVENOUS
  Administered 2023-12-10: 120 mg via INTRAVENOUS

## 2023-12-10 MED ORDER — GLUCAGON HCL RDNA (DIAGNOSTIC) 1 MG IJ SOLR
INTRAMUSCULAR | Status: AC
  Filled 2023-12-10: qty 1

## 2023-12-10 MED ORDER — SUGAMMADEX SODIUM 200 MG/2ML IV SOLN
INTRAVENOUS | Status: DC | PRN
  Administered 2023-12-10: 200 mg via INTRAVENOUS

## 2023-12-10 NOTE — Plan of Care (Signed)

## 2023-12-10 NOTE — Anesthesia Postprocedure Evaluation (Signed)
 Anesthesia Post Note  Patient: Jeffrey Contreras  Procedure(s) Performed: ERCP, WITH INTERVENTION IF INDICATED     Patient location during evaluation: PACU Anesthesia Type: General Level of consciousness: awake and alert Pain management: pain level controlled Vital Signs Assessment: post-procedure vital signs reviewed and stable Respiratory status: spontaneous breathing, nonlabored ventilation, respiratory function stable and patient connected to nasal cannula oxygen Cardiovascular status: blood pressure returned to baseline and stable Postop Assessment: no apparent nausea or vomiting Anesthetic complications: no   No notable events documented.  Last Vitals:  Vitals:   12/10/23 1330 12/10/23 1415  BP: 124/76 118/79  Pulse: (!) 56 (!) 50  Resp: 20 18  Temp:  (!) 36.3 C  SpO2: 93% 98%    Last Pain:  Vitals:   12/10/23 1415  TempSrc: Axillary  PainSc: 0-No pain                 Emryn Flanery S

## 2023-12-10 NOTE — Progress Notes (Signed)
 He PROGRESS NOTE  Maven Chalmers HKV:[id number] DOB: [birth year] DOA: 12/07/2023 PCP: Luther Saltness, MD   LOS: 3 days   Brief Narrative / Interim history: 78 year old male with history of PAF on Eliquis , BPPV, HTN who comes into the hospital with abdominal pain.  He experienced this pain mainly in the epigastric area, dull, and given persistence throughout the night decided come to the emergency room.  No nausea, vomiting, diarrhea, no fever or chills.  In the ED he was found to have significantly elevated LFTs as well as choledocholithiasis and cholelithiasis.  Subjective / 24h Interval events: He is doing well this morning, had a little bit of epigastric abdominal pain last night but now better.  Assesement and Plan: Principal Problem:   Choledocholithiasis with obstruction and transaminitis Active Problems:   Great toe pain, left   QT prolongation   Paroxysmal atrial fibrillation (HCC)   Essential hypertension   Hyperlipidemia  Principal problem Choledocholithiasis, cholelithiasis -based on initial imaging as well as MRI/MRCP.  Gastroenterology as well as general surgery consulted.  He is on anticoagulation at home, but has not taken for 2 days now. - Underwent ERCP yesterday with Dr. Gayl Katos, cannulation failed.  He will undergo repeat ERCP today with Dr. Lavaughn Portland - Surgery is following as well and plans are in place for eventual cholecystectomy prior to discharge  Active problems PAF-he is in sinus right now, continue to hold anticoagulation for future ERCP and potential cholecystectomy  Essential hypertension-continue HCTZ, spironolactone , ARB, blood pressure is acceptable today  Mild AKI, Creatinine elevation-creatinine up to 1.3 from 1.0 yesterday, hold HCTZ and spironolactone .  Will give a small bolus given that he is now n.p.o. again and has been so for the past couple of days  Hyperlipidemia-hold statin with elevated LFTs  Elevated LFTs-due to #1, AST/ALT are  improving however bilirubin is increasing  Scheduled Meds:  brimonidine   1 drop Both Eyes BID   And   timolol   1 drop Both Eyes BID   dorzolamide   1 drop Left Eye TID   irbesartan  75 mg Oral Daily   sodium chloride  flush  3-10 mL Intravenous Q12H   Continuous Infusions:   PRN Meds:.morphine injection, sodium chloride  flush, traMADol  Current Outpatient Medications  Medication Instructions   atorvastatin  (LIPITOR) 40 mg, Oral, Daily   benzonatate  (TESSALON ) 100 mg, Oral, 3 times daily PRN   brimonidine -timolol  (COMBIGAN ) 0.2-0.5 % ophthalmic solution 1 drop, 2 times daily   diltiazem  (CARDIZEM ) 30 mg, Oral, Every 4 hours PRN   dorzolamide  (TRUSOPT ) 2 % ophthalmic solution 1 drop, 3 times daily   Eliquis  5 mg, Oral, 2 times daily   eplerenone  (INSPRA ) 25 mg, Oral, Daily   fexofenadine (ALLEGRA) 180 mg, Oral, Daily   fluticasone (FLONASE) 50 MCG/ACT nasal spray 1 spray, Each Nare, Daily   furosemide  (LASIX ) 20 mg, Oral, Daily   hydrochlorothiazide  (MICROZIDE ) 12.5 mg, Oral, Daily   Misc Natural Products (PUMPKIN SEED OIL) CAPS 1 capsule, 2 times daily   telmisartan  (MICARDIS ) 40 mg, Oral, Daily    Diet Orders (From admission, onward)     Start     Ordered   12/10/23 0001  Diet NPO time specified  Diet effective midnight        12/09/23 1526            DVT prophylaxis: Place TED hose Start: 12/07/23 2003   Lab Results  Component Value Date   PLT 198 12/10/2023      Code Status: Full Code  Family Communication: Wife at bedside  Status is: Inpatient Remains inpatient appropriate because: Severity of illness   Level of care: Telemetry  Consultants:  Gastroenterology General Surgery  Objective: Vitals:   12/09/23 1455 12/09/23 1500 12/09/23 1924 12/10/23 0444  BP:  (!) 147/69 129/81 127/73  Pulse: (!) 53 (!) 54 (!) 57 (!) 57  Resp: 15 12 18 15   Temp:   97.8 F (36.6 C) 97.8 F (36.6 C)  TempSrc:      SpO2: 94% 99% 99% 96%  Weight:      Height:         Intake/Output Summary (Last 24 hours) at 12/10/2023 1003 Last data filed at 12/09/2023 1700 Gross per 24 hour  Intake 1040 ml  Output --  Net 1040 ml   Wt Readings from Last 3 Encounters:  12/09/23 88.4 kg  11/25/23 91.6 kg  05/16/23 87.1 kg    Examination:  Constitutional: NAD Eyes: lids and conjunctivae normal, + scleral icterus ENMT: mmm Neck: normal, supple Respiratory: clear to auscultation bilaterally, no wheezing, no crackles. Normal respiratory effort.  Cardiovascular: Regular rate and rhythm, no murmurs / rubs / gallops. No LE edema. Abdomen: soft, no distention, no tenderness. Bowel sounds positive.   Data Reviewed: I have independently reviewed following labs and imaging studies   CBC Recent Labs  Lab 12/07/23 1054 12/08/23 0820 12/09/23 0521 12/10/23 0521  WBC 6.2 6.3 6.3 7.0  HGB 15.4 14.2 13.7 13.3  HCT 45.5 42.1 41.6 41.5  PLT 274 222 207 198  MCV 91.2 94.0 95.6 96.5  MCH 30.9 31.7 31.5 30.9  MCHC 33.8 33.7 32.9 32.0  RDW 13.2 13.3 13.3 13.2    Recent Labs  Lab 12/07/23 1054 12/07/23 1336 12/07/23 2048 12/08/23 0820 12/09/23 0521 12/10/23 0521  NA 138  --   --  138 135 138  K 3.6  --   --  3.6 3.6 3.6  CL 101  --   --  106 106 107  CO2 19*  --   --  22 21* 22  GLUCOSE 181*  --   --  112* 114* 88  BUN 24*  --   --  22 19 25*  CREATININE 1.13  --   --  1.03 1.00 1.33*  CALCIUM  9.7  --   --  8.8* 8.6* 8.5*  AST 1,147*  --   --  592* 362* 175*  ALT 1,013*  --   --  925* 719* 495*  ALKPHOS 183*  --   --  186* 210* 231*  BILITOT 2.5*  --   --  4.1* 6.5* 2.5*  ALBUMIN 4.1  --   --  3.3* 3.1* 2.9*  MG  --   --  2.1  --  2.0 1.9  INR  --  1.2  --  1.2  --   --     ------------------------------------------------------------------------------------------------------------------ No results for input(s): "CHOL", "HDL", "LDLCALC", "TRIG", "CHOLHDL", "LDLDIRECT" in the last 72 hours.  Lab Results  Component Value Date   HGBA1C 5.5  09/16/2019   ------------------------------------------------------------------------------------------------------------------ No results for input(s): "TSH", "T4TOTAL", "T3FREE", "THYROIDAB" in the last 72 hours.  Invalid input(s): "FREET3"  Cardiac Enzymes No results for input(s): "CKMB", "TROPONINI", "MYOGLOBIN" in the last 168 hours.  Invalid input(s): "CK" ------------------------------------------------------------------------------------------------------------------    Component Value Date/Time   BNP 168.6 (H) 09/15/2019 2308    CBG: No results for input(s): "GLUCAP" in the last 168 hours.  No results found for this or any previous visit (from  the past 240 hours).   Radiology Studies: DG C-Arm 1-60 Min-No Report Result Date: 12/09/2023 Fluoroscopy was utilized by the requesting physician.  No radiographic interpretation.      Kathlen Para, MD, PhD [facility]  Between 7 am - 7 pm I am available, please contact me via Amion (for emergencies) or Securechat (non urgent messages)  Between 7 pm - 7 am I am not available, please contact night coverage MD/APP via Amion

## 2023-12-10 NOTE — Transfer of Care (Signed)
 Immediate Anesthesia Transfer of Care Note  Patient: Jeffrey Contreras  Procedure(s) Performed: ERCP, WITH INTERVENTION IF INDICATED SPHINCTEROTOMy ERCP, WITH LITHROTRIPSY OR REMOVAL OF COMMON BILE DUCT CALCULUS USING BASKET INSERTION, STENT, PANCREATIC DUCT  Patient Location: PACU  Anesthesia Type:General  Level of Consciousness: awake and alert   Airway & Oxygen Therapy: Patient Spontanous Breathing and Patient connected to face mask oxygen  Post-op Assessment: Report given to RN and Post -op Vital signs reviewed and stable  Post vital signs: Reviewed and stable  Last Vitals:  Vitals Value Taken Time  BP 127/59 12/10/23 1312  Temp 36.8 C 12/10/23 1312  Pulse 63 12/10/23 1316  Resp 18 12/10/23 1316  SpO2 95 % 12/10/23 1316  Vitals shown include unfiled device data.  Last Pain:  Vitals:   12/10/23 1312  TempSrc: Temporal  PainSc: 0-No pain         Complications: No notable events documented.

## 2023-12-10 NOTE — Addendum Note (Signed)
 Addendum  created 12/10/23 1442 by Josetta Niece, CRNA   Flowsheet accepted

## 2023-12-10 NOTE — Op Note (Signed)
 [facility] Patient Name: Jeffrey Contreras Procedure Date: 12/10/2023 MRN: [id number] Attending MD: Ozell Blunt , MD, [id number] Date of Birth: [birth year] CSN: [id number] Age: 78 Admit Type: Inpatient Procedure:                ERCP Indications:              For therapy of bile duct stone(s) seen on MRCP and                            patient with elevated liver test Providers:                Ozell Blunt, MD, Ambrose Junk, RN, Arlin Benes,                            Technician, Annmarie Basket, CRNA Referring MD:              Medicines:                General Anesthesia Complications:            No immediate complications. Estimated Blood Loss:     Estimated blood loss: none. Procedure:                Pre-Anesthesia Assessment:                           - Prior to the procedure, a History and Physical                            was performed, and patient medications and                            allergies were reviewed. The patient's tolerance of                            previous anesthesia was also reviewed. The risks                            and benefits of the procedure and the sedation                            options and risks were discussed with the patient.                            All questions were answered, and informed consent                            was obtained. Prior Anticoagulants: The patient has                            taken Eliquis  (apixaban ), last dose was 3 days                            prior to procedure. ASA Grade Assessment: III - A  patient with severe systemic disease. After                            reviewing the risks and benefits, the patient was                            deemed in satisfactory condition to undergo the                            procedure.                           After obtaining informed consent, the scope was                            passed under direct vision. Throughout the                             procedure, the patient's blood pressure, pulse, and                            oxygen saturations were monitored continuously. The                            [id number]-Q190V ([id number]) Olympus duodenoscope was                            introduced through the mouth, and used to inject                            contrast into and used to cannulate the bile duct.                            The ERCP was accomplished without difficulty. The                            patient tolerated the procedure well. 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 The major papilla       was bulging. On initial cannulation the wire went towards the pancreas       and the sphincterotome was repositioned but it still went towards the       pancreas so we placed one 4 Fr by 3 cm temporary plastic pancreatic       stent with a 3/4 external pigtail and no internal flaps was placed 2 cm       into the ventral pancreatic duct. The stent was in good position. Deep       selective cannulation of the CBD was then readily obtained and we       thought we saw the distal stone on initial cholangiogram and we       proceeded with a customary biliary sphincterotomy was made with a       Hydratome sphincterotome using ERBE electrocautery. There was no       post-sphincterotomy bleeding. We proceeded until we had adequate biliary  drainage and could get the fully bowed sphincterotome easily in and out       of the duct and to discover objects, the biliary tree was swept with a       12 mm balloon starting at the bifurcation. Minimal sludge was swept from       the duct. Multiple negative balloon pull-through's were done and the 12       mm balloon passed readily through the patent sphincterotomy site and no       residual filling defects were seen on occlusion cholangiogram and the       balloon and wire and scope was then removed and the patient tolerated        the procedure well there was no obvious immediate complication Impression:               - The major papilla was on the rim of a                            diverticulum.                           - The major papilla appeared to be bulging.                           - One temporary plastic pancreatic stent was placed                            into the ventral pancreatic duct.                           - A biliary sphincterotomy was performed.                           - The biliary tree was swept and sludge was found. Moderate Sedation:      Not Applicable - Patient had care per Anesthesia. Recommendation:           - Clear liquid diet today. But n.p.o. after                            midnight for possible cholecystectomy tomorrow                           - Continue present medications.                           - Return to GI clinic PRN.                           - Telephone GI clinic if symptomatic PRN.                           - Check liver enzymes (AST, ALT, alkaline                            phosphatase, bilirubin) in the morning. And follow  back to normal as an outpatient                           - Perform a flat plate and upright abdominal x-ray                            in 2 weeks to make sure pancreatic duct stent falls                            out on its own. Procedure Code(s):        --- Professional ---                           [phone], Endoscopic retrograde                            cholangiopancreatography (ERCP); with placement of                            endoscopic stent into biliary or pancreatic duct,                            including pre- and post-dilation and guide wire                            passage, when performed, including sphincterotomy,                            when performed, each stent                           43264, Endoscopic retrograde                            cholangiopancreatography (ERCP); with removal of                             calculi/debris from biliary/pancreatic duct(s)                           43262, 59, Endoscopic retrograde                            cholangiopancreatography (ERCP); with                            sphincterotomy/papillotomy Diagnosis Code(s):        --- Professional ---                           K80.50, Calculus of bile duct without cholangitis                            or cholecystitis without obstruction                           K83.8, Other specified diseases  of biliary tract CPT copyright 2022 [employer]. All rights reserved. The codes documented in this report are preliminary and upon coder review may  be revised to meet current compliance requirements. Ozell Blunt, MD 12/10/2023 1:15:09 PM This report has been signed electronically. Number of Addenda: 0

## 2023-12-10 NOTE — Interval H&P Note (Signed)
 History and Physical Interval Note: 78/male with CBD stone for ERCP with propofol .   12/10/2023 12:15 PM  [name]  has presented today for ERCP with propofol , with the diagnosis of CBD stone, elevated LFTs, abnormal MRCP.  The various methods of treatment have been discussed with the patient and family. After consideration of risks, benefits and other options for treatment, the patient has consented to  Procedure(s): ERCP, WITH INTERVENTION IF INDICATED (N/A) as a surgical intervention.  The patient's history has been reviewed, patient examined, no change in status, stable for surgery.  I have reviewed the patient's chart and labs.  Questions were answered to the patient's satisfaction.     Genell Ken

## 2023-12-10 NOTE — Anesthesia Postprocedure Evaluation (Signed)
 Anesthesia Post Note  Patient: Jeffrey Contreras  Procedure(s) Performed: ERCP, WITH INTERVENTION IF INDICATED SPHINCTEROTOMy ERCP, WITH LITHROTRIPSY OR REMOVAL OF COMMON BILE DUCT CALCULUS USING BASKET INSERTION, STENT, PANCREATIC DUCT     Patient location during evaluation: PACU Anesthesia Type: General Level of consciousness: awake and alert Pain management: pain level controlled Vital Signs Assessment: post-procedure vital signs reviewed and stable Respiratory status: spontaneous breathing, nonlabored ventilation, respiratory function stable and patient connected to nasal cannula oxygen Cardiovascular status: blood pressure returned to baseline and stable Postop Assessment: no apparent nausea or vomiting Anesthetic complications: no  No notable events documented.  Last Vitals:  Vitals:   12/10/23 1330 12/10/23 1415  BP: 124/76 118/79  Pulse: (!) 56 (!) 50  Resp: 20 18  Temp:  (!) 36.3 C  SpO2: 93% 98%    Last Pain:  Vitals:   12/10/23 1415  TempSrc: Axillary  PainSc: 0-No pain                 Rosalita Combe

## 2023-12-10 NOTE — Anesthesia Procedure Notes (Signed)
 Procedure Name: Intubation Date/Time: 12/10/2023 12:24 PM  Performed by: Josetta Niece, CRNAPre-anesthesia Checklist: Patient identified, Emergency Drugs available, Suction available and Patient being monitored Patient Re-evaluated:Patient Re-evaluated prior to induction Oxygen Delivery Method: Circle System Utilized Preoxygenation: Pre-oxygenation with 100% oxygen Induction Type: IV induction Ventilation: Mask ventilation without difficulty Laryngoscope Size: Mac and 3 Grade View: Grade II Tube type: Oral Tube size: 8.0 mm Number of attempts: 1 Airway Equipment and Method: Stylet Placement Confirmation: ETT inserted through vocal cords under direct vision, positive ETCO2 and breath sounds checked- equal and bilateral Secured at: 23 cm Tube secured with: Tape Dental Injury: Teeth and Oropharynx as per pre-operative assessment

## 2023-12-11 ENCOUNTER — Inpatient Hospital Stay (HOSPITAL_COMMUNITY): Admitting: Anesthesiology

## 2023-12-11 ENCOUNTER — Encounter (HOSPITAL_COMMUNITY): Payer: Self-pay | Admitting: Gastroenterology

## 2023-12-11 ENCOUNTER — Encounter (HOSPITAL_COMMUNITY)
Admission: EM | Disposition: A | Discharge status: Home / Self Care | Payer: Self-pay | Source: Home / Self Care | Attending: Internal Medicine

## 2023-12-11 DIAGNOSIS — K8051 Calculus of bile duct without cholangitis or cholecystitis with obstruction: Secondary | ICD-10-CM | POA: Diagnosis not present

## 2023-12-11 DIAGNOSIS — I10 Essential (primary) hypertension: Secondary | ICD-10-CM | POA: Diagnosis not present

## 2023-12-11 DIAGNOSIS — E119 Type 2 diabetes mellitus without complications: Secondary | ICD-10-CM

## 2023-12-11 DIAGNOSIS — K8020 Calculus of gallbladder without cholecystitis without obstruction: Secondary | ICD-10-CM | POA: Diagnosis not present

## 2023-12-11 DIAGNOSIS — Z7984 Long term (current) use of oral hypoglycemic drugs: Secondary | ICD-10-CM | POA: Diagnosis not present

## 2023-12-11 HISTORY — PX: CHOLECYSTECTOMY: SHX55

## 2023-12-11 LAB — COMPREHENSIVE METABOLIC PANEL WITH GFR
ALT: 369 U/L — ABNORMAL HIGH (ref 0–44)
AST: 91 U/L — ABNORMAL HIGH (ref 15–41)
Albumin: 3 g/dL — ABNORMAL LOW (ref 3.5–5.0)
Alkaline Phosphatase: 224 U/L — ABNORMAL HIGH (ref 38–126)
Anion gap: 13 (ref 5–15)
BUN: 25 mg/dL — ABNORMAL HIGH (ref 8–23)
CO2: 19 mmol/L — ABNORMAL LOW (ref 22–32)
Calcium: 8.7 mg/dL — ABNORMAL LOW (ref 8.9–10.3)
Chloride: 106 mmol/L (ref 98–111)
Creatinine, Ser: 0.91 mg/dL (ref 0.61–1.24)
GFR, Estimated: >60 mL/min (ref >60)
Glucose, Bld: 129 mg/dL — ABNORMAL HIGH (ref 70–99)
Potassium: 3.9 mmol/L (ref 3.5–5.1)
Sodium: 138 mmol/L (ref 135–145)
Total Bilirubin: 1.8 mg/dL — ABNORMAL HIGH (ref 0.0–1.2)
Total Protein: 5.8 g/dL — ABNORMAL LOW (ref 6.5–8.1)

## 2023-12-11 LAB — CBC
HCT: 43.1 % (ref 39.0–52.0)
Hemoglobin: 13.7 g/dL (ref 13.0–17.0)
MCH: 31 pg (ref 26.0–34.0)
MCHC: 31.8 g/dL (ref 30.0–36.0)
MCV: 97.5 fL (ref 80.0–100.0)
Platelets: 188 10*3/uL (ref 150–400)
RBC: 4.42 MIL/uL (ref 4.22–5.81)
RDW: 13.2 % (ref 11.5–15.5)
WBC: 11.1 10*3/uL — ABNORMAL HIGH (ref 4.0–10.5)
nRBC: 0 % (ref 0.0–0.2)

## 2023-12-11 LAB — MAGNESIUM: Magnesium: 2 mg/dL (ref 1.7–2.4)

## 2023-12-11 LAB — LIPASE, BLOOD: Lipase: 138 U/L — ABNORMAL HIGH (ref 11–51)

## 2023-12-11 SURGERY — LAPAROSCOPIC CHOLECYSTECTOMY
Anesthesia: General

## 2023-12-11 MED ORDER — TRAMADOL HCL 50 MG PO TABS: 50.0000 mg | ORAL_TABLET | Freq: Four times a day (QID) | ORAL | Status: DC | PRN

## 2023-12-11 MED ORDER — DEXAMETHASONE SODIUM PHOSPHATE 10 MG/ML IJ SOLN
INTRAMUSCULAR | Status: AC
  Filled 2023-12-11: qty 1

## 2023-12-11 MED ORDER — ONDANSETRON HCL 4 MG/2ML IJ SOLN
INTRAMUSCULAR | Status: AC
  Filled 2023-12-11: qty 2

## 2023-12-11 MED ORDER — DEXAMETHASONE SODIUM PHOSPHATE 10 MG/ML IJ SOLN
INTRAMUSCULAR | Status: DC | PRN
  Administered 2023-12-11: 8 mg via INTRAVENOUS

## 2023-12-11 MED ORDER — ONDANSETRON HCL 4 MG/2ML IJ SOLN
INTRAMUSCULAR | Status: DC | PRN
  Administered 2023-12-11: 4 mg via INTRAVENOUS

## 2023-12-11 MED ORDER — CEFAZOLIN SODIUM-DEXTROSE 2-4 GM/100ML-% IV SOLN
INTRAVENOUS | Status: AC
  Filled 2023-12-11: qty 100

## 2023-12-11 MED ORDER — ROCURONIUM BROMIDE 100 MG/10ML IV SOLN
INTRAVENOUS | Status: DC | PRN
Start: 2023-12-11 — End: 2023-12-11
  Administered 2023-12-11: 30 mg via INTRAVENOUS

## 2023-12-11 MED ORDER — HYDROMORPHONE HCL 2 MG/ML IJ SOLN
INTRAMUSCULAR | Status: AC
  Filled 2023-12-11: qty 1

## 2023-12-11 MED ORDER — HYDROMORPHONE HCL 1 MG/ML IJ SOLN
INTRAMUSCULAR | Status: AC
  Administered 2023-12-11: 0.5 mg via INTRAVENOUS
  Filled 2023-12-11: qty 1

## 2023-12-11 MED ORDER — PROPOFOL 10 MG/ML IV BOLUS
INTRAVENOUS | Status: AC
  Filled 2023-12-11: qty 20

## 2023-12-11 MED ORDER — FENTANYL CITRATE (PF) 100 MCG/2ML IJ SOLN
INTRAMUSCULAR | Status: AC
  Filled 2023-12-11: qty 2

## 2023-12-11 MED ORDER — PROPOFOL 10 MG/ML IV BOLUS
INTRAVENOUS | Status: DC | PRN
Start: 2023-12-11 — End: 2023-12-11
  Administered 2023-12-11: 150 mg via INTRAVENOUS

## 2023-12-11 MED ORDER — DROPERIDOL 2.5 MG/ML IJ SOLN: 0.6250 mg | Freq: Once | INTRAMUSCULAR | Status: DC | PRN

## 2023-12-11 MED ORDER — LABETALOL HCL 5 MG/ML IV SOLN
INTRAVENOUS | Status: AC
  Filled 2023-12-11: qty 4

## 2023-12-11 MED ORDER — ACETAMINOPHEN 500 MG PO TABS
1000.0000 mg | ORAL_TABLET | Freq: Three times a day (TID) | ORAL | Status: DC
  Administered 2023-12-11 – 2023-12-12 (×3): 1000 mg via ORAL
  Filled 2023-12-11 (×3): qty 2

## 2023-12-11 MED ORDER — HYDROMORPHONE HCL 1 MG/ML IJ SOLN
INTRAMUSCULAR | Status: DC | PRN
  Administered 2023-12-11: .4 mg via INTRAVENOUS

## 2023-12-11 MED ORDER — LACTATED RINGERS IV SOLN: INTRAVENOUS | Status: DC

## 2023-12-11 MED ORDER — CHLORHEXIDINE GLUCONATE 0.12 % MT SOLN
15.0000 mL | Freq: Once | OROMUCOSAL | Status: AC
  Administered 2023-12-11: 15 mL via OROMUCOSAL

## 2023-12-11 MED ORDER — CEFAZOLIN SODIUM-DEXTROSE 2-4 GM/100ML-% IV SOLN
2.0000 g | Freq: Once | INTRAVENOUS | Status: AC
  Administered 2023-12-11: 2 g via INTRAVENOUS

## 2023-12-11 MED ORDER — FENTANYL CITRATE (PF) 100 MCG/2ML IJ SOLN
INTRAMUSCULAR | Status: AC
Start: 2023-12-11 — End: ?
  Filled 2023-12-11: qty 2

## 2023-12-11 MED ORDER — BUPIVACAINE HCL (PF) 0.5 % IJ SOLN
INTRAMUSCULAR | Status: AC
  Filled 2023-12-11: qty 30

## 2023-12-11 MED ORDER — FENTANYL CITRATE (PF) 100 MCG/2ML IJ SOLN
INTRAMUSCULAR | Status: DC | PRN
  Administered 2023-12-11 (×4): 50 ug via INTRAVENOUS

## 2023-12-11 MED ORDER — HEMOSTATIC AGENTS (NO CHARGE) OPTIME
TOPICAL | Status: DC | PRN
Start: 2023-12-11 — End: 2023-12-11
  Administered 2023-12-11 (×2): 1

## 2023-12-11 MED ORDER — HYDROMORPHONE HCL 1 MG/ML IJ SOLN
0.2500 mg | INTRAMUSCULAR | Status: DC | PRN
  Administered 2023-12-11: 0.5 mg via INTRAVENOUS

## 2023-12-11 MED ORDER — SUCCINYLCHOLINE CHLORIDE 200 MG/10ML IV SOSY
PREFILLED_SYRINGE | INTRAVENOUS | Status: DC | PRN
  Administered 2023-12-11: 120 mg via INTRAVENOUS

## 2023-12-11 MED ORDER — METHOCARBAMOL 500 MG PO TABS
500.0000 mg | ORAL_TABLET | Freq: Three times a day (TID) | ORAL | Status: DC
  Administered 2023-12-11 – 2023-12-12 (×3): 500 mg via ORAL
  Filled 2023-12-11 (×3): qty 1

## 2023-12-11 MED ORDER — SUGAMMADEX SODIUM 200 MG/2ML IV SOLN
INTRAVENOUS | Status: DC | PRN
  Administered 2023-12-11: 200 mg via INTRAVENOUS

## 2023-12-11 MED ORDER — LABETALOL HCL 5 MG/ML IV SOLN
INTRAVENOUS | Status: DC | PRN
  Administered 2023-12-11: 5 mg via INTRAVENOUS

## 2023-12-11 MED ORDER — 0.9 % SODIUM CHLORIDE (POUR BTL) OPTIME
TOPICAL | Status: DC | PRN
Start: 2023-12-11 — End: 2023-12-11
  Administered 2023-12-11: 1000 mL

## 2023-12-11 MED ORDER — LACTATED RINGERS IR SOLN
Status: DC | PRN
  Administered 2023-12-11 (×2): 1000 mL

## 2023-12-11 MED ORDER — ROCURONIUM BROMIDE 10 MG/ML (PF) SYRINGE
PREFILLED_SYRINGE | INTRAVENOUS | Status: AC
  Filled 2023-12-11: qty 30

## 2023-12-11 MED ORDER — BUPIVACAINE HCL (PF) 0.5 % IJ SOLN
INTRAMUSCULAR | Status: DC | PRN
  Administered 2023-12-11: 20 mL

## 2023-12-11 MED ORDER — LACTATED RINGERS IV SOLN: INTRAVENOUS | Status: DC | PRN

## 2023-12-11 MED ORDER — LIDOCAINE HCL (PF) 2 % IJ SOLN
INTRAMUSCULAR | Status: AC
  Filled 2023-12-11: qty 10

## 2023-12-11 MED ORDER — SUCCINYLCHOLINE CHLORIDE 200 MG/10ML IV SOSY
PREFILLED_SYRINGE | INTRAVENOUS | Status: AC
  Filled 2023-12-11: qty 10

## 2023-12-11 MED ORDER — INDOCYANINE GREEN 25 MG IV SOLR
2.5000 mg | Freq: Once | INTRAVENOUS | Status: AC
  Administered 2023-12-11: 2.5 mg via INTRAVENOUS
  Filled 2023-12-11: qty 10

## 2023-12-11 MED ORDER — LIDOCAINE HCL (CARDIAC) PF 100 MG/5ML IV SOSY
PREFILLED_SYRINGE | INTRAVENOUS | Status: DC | PRN
  Administered 2023-12-11: 100 mg via INTRAVENOUS

## 2023-12-11 SURGICAL SUPPLY — 35 items
BAG COUNTER SPONGE SURGICOUNT (BAG) IMPLANT
CABLE HIGH FREQUENCY MONO STRZ (ELECTRODE) ×1 IMPLANT
CHLORAPREP W/TINT 26 (MISCELLANEOUS) ×1 IMPLANT
CLIP APPLIE 5 13 M/L LIGAMAX5 (MISCELLANEOUS) ×1 IMPLANT
COVER MAYO STAND XLG (MISCELLANEOUS) IMPLANT
COVER SURGICAL LIGHT HANDLE (MISCELLANEOUS) ×1 IMPLANT
DERMABOND ADVANCED .7 DNX12 (GAUZE/BANDAGES/DRESSINGS) ×1 IMPLANT
DRAIN CHANNEL 19F RND (DRAIN) IMPLANT
DRAPE C-ARM 42X120 X-RAY (DRAPES) IMPLANT
ELECT REM PT RETURN 15FT ADLT (MISCELLANEOUS) ×1 IMPLANT
ENDOLOOP SUT PDS II 0 18 (SUTURE) IMPLANT
EVACUATOR SILICONE 100CC (DRAIN) IMPLANT
GLOVE BIO SURGEON STRL SZ7.5 (GLOVE) ×1 IMPLANT
GOWN STRL REUS W/ TWL XL LVL3 (GOWN DISPOSABLE) ×1 IMPLANT
HEMOSTAT SNOW SURGICEL 2X4 (HEMOSTASIS) IMPLANT
HEMOSTAT SURGICEL 4X8 (HEMOSTASIS) IMPLANT
IRRIGATION SUCT STRKRFLW 2 WTP (MISCELLANEOUS) ×1 IMPLANT
KIT BASIN OR (CUSTOM PROCEDURE TRAY) ×1 IMPLANT
KIT TURNOVER KIT A (KITS) IMPLANT
PENCIL SMOKE EVACUATOR (MISCELLANEOUS) IMPLANT
SCISSORS LAP 5X35 DISP (ENDOMECHANICALS) ×1 IMPLANT
SET CHOLANGIOGRAPH MIX (MISCELLANEOUS) IMPLANT
SET TUBE SMOKE EVAC HIGH FLOW (TUBING) ×1 IMPLANT
SLEEVE Z-THREAD 5X100MM (TROCAR) ×2 IMPLANT
SPIKE FLUID TRANSFER (MISCELLANEOUS) ×1 IMPLANT
SPONGE DRAIN TRACH 4X4 STRL 2S (GAUZE/BANDAGES/DRESSINGS) IMPLANT
SUT ETHILON 2 0 PS N (SUTURE) IMPLANT
SUT MNCRL AB 4-0 PS2 18 (SUTURE) ×1 IMPLANT
SUT VICRYL 0 UR6 27IN ABS (SUTURE) ×1 IMPLANT
SYSTEM BAG RETRIEVAL 10MM (BASKET) ×1 IMPLANT
TOWEL OR 17X26 10 PK STRL BLUE (TOWEL DISPOSABLE) ×1 IMPLANT
TRAY LAPAROSCOPIC (CUSTOM PROCEDURE TRAY) ×1 IMPLANT
TROCAR 11X100 Z THREAD (TROCAR) IMPLANT
TROCAR BALLN 12MMX100 BLUNT (TROCAR) ×1 IMPLANT
TROCAR Z-THREAD OPTICAL 5X100M (TROCAR) ×1 IMPLANT

## 2023-12-11 NOTE — Anesthesia Procedure Notes (Signed)
 Procedure Name: Intubation Date/Time: 12/11/2023 12:03 PM  Performed by: Josetta Niece, CRNAPre-anesthesia Checklist: Patient identified, Emergency Drugs available, Suction available and Patient being monitored Patient Re-evaluated:Patient Re-evaluated prior to induction Oxygen Delivery Method: Circle System Utilized Preoxygenation: Pre-oxygenation with 100% oxygen Induction Type: IV induction, Cricoid Pressure applied and Rapid sequence Ventilation: Mask ventilation without difficulty Laryngoscope Size: Mac and 3 Grade View: Grade II Tube type: Oral Tube size: 7.5 mm Number of attempts: 1 Airway Equipment and Method: Stylet Placement Confirmation: ETT inserted through vocal cords under direct vision, positive ETCO2 and breath sounds checked- equal and bilateral Tube secured with: Tape Dental Injury: Teeth and Oropharynx as per pre-operative assessment

## 2023-12-11 NOTE — Progress Notes (Signed)
 Subjective: He states he is hungry. States abdominal pain is 1 out of 10 in intensity.   Objective: Vital signs in last 24 hours: Temp:  [97.3 F (36.3 C)-98.3 F (36.8 C)] 97.8 F (36.6 C) (04/30 0932) Pulse Rate:  [50-83] 83 (04/30 0932) Resp:  [15-20] 18 (04/30 0932) BP: (118-144)/(54-87) 123/87 (04/30 0932) SpO2:  [93 %-100 %] 100 % (04/30 0932) Weight:  [88.4 kg] 88.4 kg (04/29 1112) Weight change: 0 kg Last BM Date : 12/07/23  PE: Nonicteric GENERAL: Not in distress  ABDOMEN: Nontender, nondistended abdomen EXTREMITIES: No deformity  Lab Results: Results for orders placed or performed during the hospital encounter of 12/07/23 (from the past 48 hours)  Comprehensive metabolic panel     Status: Abnormal   Collection Time: 12/10/23  5:21 AM  Result Value Ref Range   Sodium 138 135 - 145 mmol/L   Potassium 3.6 3.5 - 5.1 mmol/L   Chloride 107 98 - 111 mmol/L   CO2 22 22 - 32 mmol/L   Glucose, Bld 88 70 - 99 mg/dL    Comment: Glucose reference range applies only to samples taken after fasting for at least 8 hours.   BUN 25 (H) 8 - 23 mg/dL   Creatinine, Ser 1.61 (H) 0.61 - 1.24 mg/dL   Calcium  8.5 (L) 8.9 - 10.3 mg/dL   Total Protein 5.4 (L) 6.5 - 8.1 g/dL   Albumin 2.9 (L) 3.5 - 5.0 g/dL   AST 096 (H) 15 - 41 U/L   ALT 495 (H) 0 - 44 U/L   Alkaline Phosphatase 231 (H) 38 - 126 U/L   Total Bilirubin 2.5 (H) 0.0 - 1.2 mg/dL    Comment: DELTA CHECK NOTED   GFR, Estimated 54 (L) >60 mL/min    Comment: (NOTE) Calculated using the CKD-EPI Creatinine Equation (2021)    Anion gap 9 5 - 15    Comment: Performed at [facility], [address], [place], [place] [address]  CBC     Status: None   Collection Time: 12/10/23  5:21 AM  Result Value Ref Range   WBC 7.0 4.0 - 10.5 K/uL   RBC 4.30 4.22 - 5.81 MIL/uL   Hemoglobin 13.3 13.0 - 17.0 g/dL   HCT 98.1 19.1 - 47.8 %   MCV 96.5 80.0 - 100.0 fL   MCH 30.9 26.0 - 34.0 pg   MCHC 32.0 30.0 - 36.0 g/dL    RDW 29.5 62.1 - 30.8 %   Platelets 198 150 - 400 K/uL   nRBC 0.0 0.0 - 0.2 %    Comment: Performed at [facility], [address], [place], [place] [address]  Magnesium     Status: None   Collection Time: 12/10/23  5:21 AM  Result Value Ref Range   Magnesium 1.9 1.7 - 2.4 mg/dL    Comment: Performed at [facility], [address] [place], [place] [address]  Comprehensive metabolic panel     Status: Abnormal   Collection Time: 12/11/23  5:40 AM  Result Value Ref Range   Sodium 138 135 - 145 mmol/L   Potassium 3.9 3.5 - 5.1 mmol/L   Chloride 106 98 - 111 mmol/L   CO2 19 (L) 22 - 32 mmol/L   Glucose, Bld 129 (H) 70 - 99 mg/dL    Comment: Glucose reference range applies only to samples taken after fasting for at least 8 hours.   BUN 25 (H) 8 - 23 mg/dL  Creatinine, Ser 0.91 0.61 - 1.24 mg/dL   Calcium  8.7 (L) 8.9 - 10.3 mg/dL   Total Protein 5.8 (L) 6.5 - 8.1 g/dL   Albumin 3.0 (L) 3.5 - 5.0 g/dL   AST 91 (H) 15 - 41 U/L   ALT 369 (H) 0 - 44 U/L   Alkaline Phosphatase 224 (H) 38 - 126 U/L   Total Bilirubin 1.8 (H) 0.0 - 1.2 mg/dL   GFR, Estimated >16 >10 mL/min    Comment: (NOTE) Calculated using the CKD-EPI Creatinine Equation (2021)    Anion gap 13 5 - 15    Comment: Performed at [facility], [address], [place], [place] [address]  CBC     Status: Abnormal   Collection Time: 12/11/23  5:40 AM  Result Value Ref Range   WBC 11.1 (H) 4.0 - 10.5 K/uL   RBC 4.42 4.22 - 5.81 MIL/uL   Hemoglobin 13.7 13.0 - 17.0 g/dL   HCT 40.9 81.1 - 91.4 %   MCV 97.5 80.0 - 100.0 fL   MCH 31.0 26.0 - 34.0 pg   MCHC 31.8 30.0 - 36.0 g/dL   RDW 78.2 95.6 - 21.3 %   Platelets 188 150 - 400 K/uL   nRBC 0.0 0.0 - 0.2 %    Comment: Performed at [facility], [address], [place], [place] [address]  Magnesium     Status: None   Collection Time: 12/11/23  5:40 AM  Result Value Ref Range   Magnesium 2.0 1.7 -  2.4 mg/dL    Comment: Performed at [facility], [address], [place], [place] [address]  Lipase, blood     Status: Abnormal   Collection Time: 12/11/23  5:40 AM  Result Value Ref Range   Lipase 138 (H) 11 - 51 U/L    Comment: Performed at [facility], [address], [place], [place] [address]    Studies/Results: DG ERCP Result Date: 12/10/2023 CLINICAL DATA:  [id number] Surgery, elective [id number] EXAM: ERCP COMPARISON:  CT AP, 12/07/2023. MRCP, 12/08/2023. ERCP, 12/09/2023. FLUOROSCOPY: Exposure Index (as provided by the fluoroscopic device): 19.0 mGy Kerma FINDINGS: Limited oblique planar images of the RIGHT upper quadrant obtained C-arm. Images demonstrating flexible endoscopy, biliary duct cannulation, sphincterotomy, retrograde cholangiogram and balloon sweep. No biliary ductal dilation. No evidence of biliary filling defect is demonstrated. A pancreatic duct stent is demonstrated at the end of the case. IMPRESSION: Fluoroscopic imaging for ERCP and pancreatic duct stent placement. For complete description of intra procedural findings, please see performing service dictation. Electronically Signed   By: Art Largo M.D.   On: 12/10/2023 15:17   DG ERCP Result Date: 12/10/2023 CLINICAL DATA:  [id number] Surgery, elective [id number] EXAM: ERCP COMPARISON:  CT AP, 12/07/2023.  MR CP, 12/07/2023. FLUOROSCOPY: Exposure Index (as provided by the fluoroscopic device): 115.7 mGy Kerma FINDINGS: Limited oblique planar images of the RIGHT upper quadrant obtained C-arm. Images demonstrating flexible endoscopy, biliary duct cannulation, sphincterotomy. Retrograde cholangiogram was not demonstrated. IMPRESSION: Fluoroscopic imaging for ERCP. For complete description of intra procedural findings, please see performing service dictation. Electronically Signed   By: Art Largo M.D.   On: 12/10/2023 12:18   DG C-Arm 1-60 Min-No Report Result Date: 12/09/2023 Fluoroscopy was  utilized by the requesting physician.  No radiographic interpretation.    Medications: I have reviewed the patient's current medications.  Assessment: Elevated LFTs, MRCP showing CBD stone, ERCP yesterday with pancreatic duct stent placement, sphincterotomy and balloon  sweep LFTs improved, T. bili 1.8/AST 91/ALT 369/ALP 224 Lipase minimally elevated 138 without associated abdominal pain  Gallstones-scheduled for laparoscopic cholecystectomy today  Plan: GI will sign off. We will plan an outpatient abdominal x-ray in 2 weeks to look at pancreatic stent. If pancreatic stent has spontaneously fallen off, no further follow-up needed. If on x-ray in 2 weeks pancreatic stent is still in place, will schedule for outpatient EGD for pancreatic duct stent removal. This has been discussed with the patient and his wife in details, they verbalized understanding and consent.  Genell Ken, MD 12/11/2023, 9:57 AM

## 2023-12-11 NOTE — Progress Notes (Signed)
 Patient ID: Jeffrey Contreras, male   DOB: 1945/08/19, 78 y.o.   MRN: [id number]  Preop     Patient has no complaints this morning.  He reports minimal abdominal discomfort. He is minimally tender on physical examination. His lipase is only up mildly and his LFTs are trending down.  Plan will be to proceed to the operating room for laparoscopic cholecystectomy today.  I discussed the procedure in detail.  We discussed the risks and benefits of a laparoscopic cholecystectomy including, but not limited to bleeding, infection, injury to surrounding structures such as the intestine or liver, bile leak, retained gallstones, need to convert to an open procedure, prolonged diarrhea, blood clots such as  DVT, common bile duct injury, anesthesia risks, and possible need for additional procedures.

## 2023-12-11 NOTE — Transfer of Care (Signed)
 Immediate Anesthesia Transfer of Care Note  Patient: Jeffrey Contreras  Procedure(s) Performed: LAPAROSCOPIC CHOLECYSTECTOMY  Patient Location: PACU  Anesthesia Type:General  Level of Consciousness: awake and alert   Airway & Oxygen Therapy: Patient Spontanous Breathing and Patient connected to face mask oxygen  Post-op Assessment: Report given to RN and Post -op Vital signs reviewed and stable  Post vital signs: Reviewed and stable  Last Vitals:  Vitals Value Taken Time  BP 162/86 12/11/23 1335  Temp    Pulse 50 12/11/23 1340  Resp 20 12/11/23 1340  SpO2 96 % 12/11/23 1340  Vitals shown include unfiled device data.  Last Pain:  Vitals:   12/11/23 1129  TempSrc:   PainSc: 0-No pain         Complications: No notable events documented.

## 2023-12-11 NOTE — Anesthesia Postprocedure Evaluation (Signed)
 Anesthesia Post Note  Patient: Jeffrey Contreras  Procedure(s) Performed: LAPAROSCOPIC CHOLECYSTECTOMY     Patient location during evaluation: PACU Anesthesia Type: General Level of consciousness: awake and alert and oriented Pain management: pain level controlled Vital Signs Assessment: post-procedure vital signs reviewed and stable Respiratory status: spontaneous breathing, nonlabored ventilation and respiratory function stable Cardiovascular status: blood pressure returned to baseline and stable Postop Assessment: no apparent nausea or vomiting Anesthetic complications: no   No notable events documented.  Last Vitals:  Vitals:   12/11/23 1415 12/11/23 1430  BP: (!) 151/77 114/62  Pulse: (!) 59 63  Resp: 18 (!) 22  Temp:    SpO2: 92% 92%    Last Pain:  Vitals:   12/11/23 1430  TempSrc:   PainSc: 8                  Pihu Basil A.

## 2023-12-11 NOTE — Progress Notes (Signed)
  Progress Note   Patient: Jeffrey Contreras WJX:[id number] DOB: 05-Apr-1946 DOA: 12/07/2023     4 DOS: the patient was seen and examined on 12/11/2023        Brief hospital course: 78 yo M with HTN, pAF on Eliquis , presented with choledocholithiasis.  Underwent ERCP 4/28 unsuccessfully.  Repeat 4/29 with stent placed.  Underwent Lap chole on 4/30.     Assessment and Plan: *Acute cholecystitis Choledocholithiasis Doing well postop - Postop care per general surgery - Hold Eliquis  until cleared by surgery - Trend LFTs   Paroxysmal atrial fibrillation - Hold Eliquis  until cleared by surgery - Hold diltiazem  for now  Hypertension BP elevated - Continue irbesartan - Hold eplerenone /Lasix   Obesity   Hyperlipidemia - Hold atoravastatin  AKI Cr improved       Subjective: Doing well postop.  Moderate right-sided pain.  No fever, no confusion, no vomiting.     Physical Exam: BP (!) 152/79 (BP Location: Left Arm)   Pulse 65   Temp (!) 97.4 F (36.3 C) (Oral)   Resp 18   Ht 5\' 9"  (1.753 m)   Wt 88.4 kg   SpO2 92%   BMI 28.78 kg/m   Adult male, lying in bed, interactive and appropriate RRR, no murmurs, no peripheral edema Respiratory normal, lungs clear without rales or wheezes Abdomen soft no tenderness palpation or guarding, no ascites or distention Attention normal, affect normal, judgment insight appear normal   Data Reviewed: LFTs improving Creatinine down to 0.9 Sodium and potassium normal Lipase Slightly up    Family Communication: Wife at the bedside    Disposition: Status is: Inpatient         Author: Ephriam Hashimoto, MD 12/11/2023 6:09 PM  For on call review [email or web address].

## 2023-12-11 NOTE — Anesthesia Preprocedure Evaluation (Addendum)
 Anesthesia Evaluation  Patient identified by MRN, date of birth, ID band Patient awake    Reviewed: Allergy & Precautions, NPO status , Patient's Chart, lab work & pertinent test results  History of Anesthesia Complications Negative for: history of anesthetic complications  Airway Mallampati: II  TM Distance: >3 FB Neck ROM: Full    Dental no notable dental hx. (+) Dental Advisory Given, Teeth Intact   Pulmonary neg pulmonary ROS   Pulmonary exam normal breath sounds clear to auscultation       Cardiovascular hypertension, Pt. on medications (-) angina (-) Past MI Normal cardiovascular exam+ dysrhythmias Atrial Fibrillation  Rhythm:Regular Rate:Normal     Neuro/Psych negative neurological ROS  negative psych ROS   GI/Hepatic negative GI ROS, Neg liver ROS,,,Choledocolithiasis Cholelithiasis- symptomatic Had ERCP yesterday   Endo/Other  diabetes, Well Controlled, Type 2, Oral Hypoglycemic Agents    Renal/GU Renal disease     Musculoskeletal negative musculoskeletal ROS (+)    Abdominal   Peds  Hematology negative hematology ROS (+)   Anesthesia Other Findings   Reproductive/Obstetrics negative OB ROS                             Anesthesia Physical Anesthesia Plan  ASA: 3  Anesthesia Plan: General   Post-op Pain Management: Minimal or no pain anticipated   Induction: Intravenous and Cricoid pressure planned  PONV Risk Score and Plan: 4 or greater and Dexamethasone , Treatment may vary due to age or medical condition and Ondansetron   Airway Management Planned: Oral ETT  Additional Equipment: None  Intra-op Plan:   Post-operative Plan: Extubation in OR  Informed Consent: I have reviewed the patients History and Physical, chart, labs and discussed the procedure including the risks, benefits and alternatives for the proposed anesthesia with the patient or authorized  representative who has indicated his/her understanding and acceptance.     Dental advisory given  Plan Discussed with: CRNA, Surgeon and Anesthesiologist  Anesthesia Plan Comments: (CBD stone, elevated LFTs, abnormal MRCP f)       Anesthesia Quick Evaluation

## 2023-12-11 NOTE — Op Note (Signed)
 LAPAROSCOPIC CHOLECYSTECTOMY  Procedure Note  Jacquise Halpin 12/07/2023 - 12/11/2023   Pre-op Diagnosis: SYMPTOMATIC GALLSTONES WITH CHOLEDOCHOLITHIASIS     Post-op Diagnosis: ACUTE CHOLECYSTITIS WITH CHOLEDOCHOLITHIASIS  Procedure(s): LAPAROSCOPIC CHOLECYSTECTOMY  Surgeon(s): Oza Blumenthal, MD  Anesthesia: General  Staff:  Circulator: Manalo, Maelin O, RN Scrub Person: Alvie Jolly; Patricia Boon M, [place]  Estimated Blood Loss: 150 cc               Specimens: sent to path  Indications: This is a 78 year old gentleman who presented with choledocholithiasis.  He had elevated bilirubin there was a size 6.5.  MRCP showed a common bile duct stone.  An ERCP was attempted by GI but they were unable to cannulate the ampulla.  The next day they again perform an ERCP I was able to cannulate the ampulla and had to place a stent in the pancreatic duct.  I swept the bile duct and no stones were identified.  Decision was then made to take the patient to the operating room for laparoscopic cholecystectomy  Findings: The patient had an acutely inflamed gallbladder which was very friable and had omentum and right colon adherent to it.  A drain was left in place after cholecystectomy  Procedure: The patient was brought to the operating identifies the correct patient.  He was placed upon the operating room table and general anesthesia was induced.  His abdomen was prepped and draped in usual sterile fashion.  I made a vertical incision above the umbilicus with a scalpel.  I carried this down to the fascia which was then opened with scalpel as well.  We then gained entrance into the abdominal cavity under direct vision.  A 0 Vicryl pursing suture was placed around the fascial opening.  The [facility] port was placed at the opening and insufflation of the abdomen was began.  The patient was found to have omentum and colon fixator of the top of the liver.  This would not move even with the patient then  reverse Trendelenburg position.  I placed a 5 mm trocar in the patient epigastrium.  I was then able to peel the omentum off throughout the liver as well as some small bowel that was above as well.  We could then visualize the liver and the edge of the gallbladder which was far above the costal margin.  I had to make a separate epigastric incision and move the trocar higher.  I then placed two 5 mm trocars in the patient's right upper quadrant.  We can barely elevate the gallbladder but elevated pull some of the omentum and start bluntly taken the colon off of the gallbladder as well.  I then brought the gallbladder down with the cautery from the dome down technique slightly in order to be aggressive better and elevated.  Omentum being pulled off the gallbladder easily for some The liver bed hemostasis was controlled with cautery.  I was then able to finally get all the omentum and colon away from the base of the gallbladder.  Identified the cystic duct and cystic artery which were very friable.  The gallbladder started tearing but I was able to identify a critical view around both structures.  I clipped the artery 3 times proximally once distally and transected it.  I then identified the cystic duct and clipped several times proximally and then distally and transected it.  There was still some bile leaking through the clips on the cystic duct so I remove these and replace the clips and  this seemed to control the cystic duct stump.  I then slowly dissected the gallbladder off the liver bed but had to leave the back wall of the gallbladder because of the friability of the liver.  I then placed the gallbladder in an Endosac and removed it through the incision at the umbilicus.  We then copes irrigated the right upper quadrant with normal saline.  I cauterized the remaining gallbladder back wall at the liver bed hemostasis peer to be achieved.  I then placed 2 pieces of surgical snow in the area.  We again irrigated  the area and saw no evidence of bile leak.  Using the lateralmost right upper quadrant trocar I then placed a 19 [place] Blake drain under direct vision into the abdomen.  We placed the drain into the gallbladder fossa and then sutured in place with a 2-0 nylon suture.  Again, hemostasis.  Be achieved.  All ports were then removed under direct vision and the abdomen was deflated.  The 0 Vicryl at the umbilicus was tied in place closing the fascial defect.  All incisions were then anesthetized Marcaine and closed with 4-0 Monocryl sutures.  Dermabond was then applied.  The patient tolerated the procedure.  All the counts were correct at the end of the procedure.  The patient was then extubated in the operating room and taken in a stable condition to the recovery room.          Oza Blumenthal   Date: 12/11/2023  Time: 1:17 PM

## 2023-12-12 ENCOUNTER — Encounter (HOSPITAL_COMMUNITY): Payer: Self-pay | Admitting: Surgery

## 2023-12-12 DIAGNOSIS — K8051 Calculus of bile duct without cholangitis or cholecystitis with obstruction: Secondary | ICD-10-CM | POA: Diagnosis not present

## 2023-12-12 LAB — COMPREHENSIVE METABOLIC PANEL WITH GFR
ALT: 271 U/L — ABNORMAL HIGH (ref 0–44)
AST: 96 U/L — ABNORMAL HIGH (ref 15–41)
Albumin: 2.8 g/dL — ABNORMAL LOW (ref 3.5–5.0)
Alkaline Phosphatase: 174 U/L — ABNORMAL HIGH (ref 38–126)
Anion gap: 10 (ref 5–15)
BUN: 25 mg/dL — ABNORMAL HIGH (ref 8–23)
CO2: 20 mmol/L — ABNORMAL LOW (ref 22–32)
Calcium: 8.4 mg/dL — ABNORMAL LOW (ref 8.9–10.3)
Chloride: 105 mmol/L (ref 98–111)
Creatinine, Ser: 0.93 mg/dL (ref 0.61–1.24)
GFR, Estimated: >60 mL/min (ref >60)
Glucose, Bld: 118 mg/dL — ABNORMAL HIGH (ref 70–99)
Potassium: 4.3 mmol/L (ref 3.5–5.1)
Sodium: 135 mmol/L (ref 135–145)
Total Bilirubin: 1.7 mg/dL — ABNORMAL HIGH (ref 0.0–1.2)
Total Protein: 5.3 g/dL — ABNORMAL LOW (ref 6.5–8.1)

## 2023-12-12 LAB — CBC
HCT: 40.9 % (ref 39.0–52.0)
Hemoglobin: 12.9 g/dL — ABNORMAL LOW (ref 13.0–17.0)
MCH: 31 pg (ref 26.0–34.0)
MCHC: 31.5 g/dL (ref 30.0–36.0)
MCV: 98.3 fL (ref 80.0–100.0)
Platelets: 188 10*3/uL (ref 150–400)
RBC: 4.16 MIL/uL — ABNORMAL LOW (ref 4.22–5.81)
RDW: 13.3 % (ref 11.5–15.5)
WBC: 10.1 10*3/uL (ref 4.0–10.5)
nRBC: 0 % (ref 0.0–0.2)

## 2023-12-12 LAB — SURGICAL PATHOLOGY

## 2023-12-12 LAB — LIPASE, BLOOD: Lipase: 52 U/L — ABNORMAL HIGH (ref 11–51)

## 2023-12-12 MED ORDER — TRAMADOL HCL 50 MG PO TABS: 50.0000 mg | ORAL_TABLET | Freq: Four times a day (QID) | ORAL | 0 refills | Status: DC | PRN

## 2023-12-12 MED ORDER — ACETAMINOPHEN 500 MG PO TABS: 1000.0000 mg | ORAL_TABLET | Freq: Four times a day (QID) | ORAL | Status: DC | PRN

## 2023-12-12 MED ORDER — METHOCARBAMOL 500 MG PO TABS: 500.0000 mg | ORAL_TABLET | Freq: Four times a day (QID) | ORAL | 0 refills | Status: DC | PRN

## 2023-12-12 NOTE — Progress Notes (Signed)
 [facility] Progress Note  1 Day Post-Op  Subjective: CC:  NAEO. Has not been OOB. + flatus. Denies nausea or vomiting. Denies abd pain.  Objective: Vital signs in last 24 hours: Temp:  [97.4 F (36.3 C)-98 F (36.7 C)] 97.9 F (36.6 C) (05/01 0420) Pulse Rate:  [51-72] 65 (05/01 0420) Resp:  [14-22] 20 (05/01 0420) BP: (114-158)/(62-86) 121/77 (05/01 0420) SpO2:  [89 %-97 %] 95 % (05/01 0420) Last BM Date : 12/07/23  Intake/Output from previous day: 04/30 0701 - 05/01 0700 In: 1421.3 [I.V.:1321.3; IV Piggyback:100] Out: 215 [Drains:65; Blood:150] Intake/Output this shift: No intake/output data recorded.  PE: Gen:  Alert, NAD, pleasant Card:  Regular rate and rhythm Pulm:  Normal effort ORA Abd: Soft, non-tender, protuberant, incisions c/d/I, RUQ drain with dark bloody drinking - 65 mL/24h Skin: warm and dry, no rashes  Psych: A&Ox3   Lab Results:  Recent Labs    12/11/23 0540 12/12/23 0547  WBC 11.1* 10.1  HGB 13.7 12.9*  HCT 43.1 40.9  PLT 188 188   BMET Recent Labs    12/11/23 0540 12/12/23 0547  NA 138 135  K 3.9 4.3  CL 106 105  CO2 19* 20*  GLUCOSE 129* 118*  BUN 25* 25*  CREATININE 0.91 0.93  CALCIUM  8.7* 8.4*   PT/INR No results for input(s): "LABPROT", "INR" in the last 72 hours. CMP     Component Value Date/Time   NA 135 12/12/2023 0547   NA 140 05/16/2023 1217   K 4.3 12/12/2023 0547   CL 105 12/12/2023 0547   CO2 20 (L) 12/12/2023 0547   GLUCOSE 118 (H) 12/12/2023 0547   BUN 25 (H) 12/12/2023 0547   BUN 20 05/16/2023 1217   CREATININE 0.93 12/12/2023 0547   CALCIUM  8.4 (L) 12/12/2023 0547   PROT 5.3 (L) 12/12/2023 0547   ALBUMIN 2.8 (L) 12/12/2023 0547   AST 96 (H) 12/12/2023 0547   ALT 271 (H) 12/12/2023 0547   ALKPHOS 174 (H) 12/12/2023 0547   BILITOT 1.7 (H) 12/12/2023 0547   GFRNONAA >60 12/12/2023 0547   GFRAA 97 01/20/2020 1116   Lipase     Component Value Date/Time   LIPASE 52 (H) 12/12/2023 0547        Studies/Results: DG ERCP Result Date: 12/10/2023 CLINICAL DATA:  [id number] Surgery, elective [id number] EXAM: ERCP COMPARISON:  CT AP, 12/07/2023. MRCP, 12/08/2023. ERCP, 12/09/2023. FLUOROSCOPY: Exposure Index (as provided by the fluoroscopic device): 19.0 mGy Kerma FINDINGS: Limited oblique planar images of the RIGHT upper quadrant obtained C-arm. Images demonstrating flexible endoscopy, biliary duct cannulation, sphincterotomy, retrograde cholangiogram and balloon sweep. No biliary ductal dilation. No evidence of biliary filling defect is demonstrated. A pancreatic duct stent is demonstrated at the end of the case. IMPRESSION: Fluoroscopic imaging for ERCP and pancreatic duct stent placement. For complete description of intra procedural findings, please see performing service dictation. Electronically Signed   By: Art Largo M.D.   On: 12/10/2023 15:17    Anti-infectives: Anti-infectives (From admission, onward)    Start     Dose/Rate Route Frequency Ordered Stop   12/11/23 1145  ceFAZolin  (ANCEF ) IVPB 2g/100 mL premix        2 g 200 mL/hr over 30 Minutes Intravenous  Once 12/11/23 1132 12/11/23 1151   12/11/23 1125  ceFAZolin  (ANCEF ) 2-4 GM/100ML-% IVPB       Note to Pharmacy: Lamonte Pimenta: cabinet override      12/11/23 1125 12/11/23 1220   12/09/23 0700  cefTRIAXone  (  ROCEPHIN ) 2 g in sodium chloride  0.9 % 100 mL IVPB        2 g 200 mL/hr over 30 Minutes Intravenous  Once 12/08/23 1610 12/09/23 1317        Assessment/Plan  Choledocholithiasis  - ERCP 4/28 unsuccessful, ERCP 4/29 was successful w/ sphincterotomy, pancreatic duct stent   Cholecystitis S/p laparoscopic cholecystectomy, placement RUQ JP drain 4/30 Dr. Lucienne Ryder - doing well, pain contrlled, has not been up - drain appears dark and bloody, non-bilious, continue JP. Plan to remove in our office in on week. Discussed with RN to perform drain education with patient and family. - patient needs to eat and mobilize.  If his pain is controlled and he is tolerating PO he can go home this afternoon. Will discuss timing of eliquis  resumption with MD.   PAF- hx ablation; EKG 4/27 sinus rhythm, hold DOAC HTn HLD   LOS: 5 days   I reviewed nursing notes, hospitalist notes, last 24 h vitals and pain scores, last 48 h intake and output, last 24 h labs and trends, and last 24 h imaging results.  This care required straight-forward level of medical decision making.   Michial Akin, PA-C [facility] Please see Amion for pager number during day hours 7:00am-4:30pm

## 2023-12-12 NOTE — Progress Notes (Signed)
 RN educated patient and spouse about how to use JP drain. Pt understood instructions and performed teach-back method to RN.

## 2023-12-12 NOTE — Discharge Summary (Signed)
 Physician Discharge Summary   Patient: Jeffrey Contreras MRN: [id number] DOB: November 17, 1945  Admit date:     12/07/2023  Discharge date: 12/12/23  Discharge Physician: Ephriam Hashimoto   PCP: Luther Saltness, MD     Recommendations at discharge:  Follow up with PCP Dr. Lucille Saas in 1 week for acute cholecystitis Follow-up with general surgery as directed for cholecystectomy postop care Follow-up with [facility] for biliary stent  [facility]: Please obtain x-ray in 2 weeks for monitoring biliary stent  Dr. Lucille Saas: Please obtain LFTs in 1 to 2 weeks, and resume atorvastatin  if/when appropriate       Discharge Diagnoses: Principal Problem:   Choledocholithiasis with obstruction and transaminitis Active Problems: Paroxysmal atrial fibrillation Hypertension Obesity Hyperlipidemia Acute kidney injury      Hospital Course: 78 yo M with HTN, pAF on Eliquis , presented with choledocholithiasis.   Underwent ERCP 4/28 unsuccessfully.  Repeat 4/29 with stent placed.  Underwent Lap chole on 4/30.     Assessment and Plan: Acute cholecystitis Choledocholithiasis Patient was admitted and underwent MRCP that showed dilation of the CBD.  GI were consulted and recommended ERCP.  Initial ERCP failed cannulation.  Repeat ERCP with Dr. Lavaughn Portland on 4/29 and stent was successfully placed, repeat balloon sweeping of the bile duct however revealed only minimal sludge.  Post procedurally, LFTs improved, general surgery took the patient for laparoscopic cholecystectomy on 4/30, and left drain in place.  Postoperatively he was able to tolerate oral diet, was ambulating well, pain was well-controlled, LFTs and lipase were improving, white count resolved, and he was discharged with outpatient follow-up.     Paroxysmal atrial fibrillation Discussed with general surgery, resume Eliquis  tonight 24 hours postop.   Hypertension Discharged to resume home ARB, eplerenone , Lasix     Hyperlipidemia Hold atoravastatin until repeat LFTs   AKI Creatinine 1.3 during hospitalization up from baseline 1.0  This resolved, and his eplerenone  and Lasix  were resumed at discharge          The [place]  Controlled Substances Registry was reviewed for this patient prior to discharge.  Consultants: Gastroenterology, Drs.Karki and [facility] General [facility] Dr. Lucienne Ryder  Procedures performed: ERCP Laparoscopic cholecystectomy  Disposition: Home Diet recommendation:  Cardiac diet  DISCHARGE MEDICATION: Allergies as of 12/12/2023       Reactions   Amoxicillin Rash   Hydrocodone-acetaminophen  Rash, Other (See Comments)   Pt states when taking this med, he feels like he is floating.   Oxycodone Nausea Only   Amoxicillin-pot Clavulanate Rash   Other reaction(s): rash        Medication List     PAUSE taking these medications    atorvastatin  40 MG tablet Wait to take this until your doctor or other care provider tells you to start again. Commonly known as: LIPITOR Take 1 tablet (40 mg total) by mouth daily.       TAKE these medications    acetaminophen  500 MG tablet Commonly known as: TYLENOL  Take 2 tablets (1,000 mg total) by mouth every 6 (six) hours as needed for mild pain (pain score 1-3) or moderate pain (pain score 4-6).   benzonatate  100 MG capsule Commonly known as: TESSALON  Take 100 mg by mouth 3 (three) times daily as needed for cough.   Combigan  0.2-0.5 % ophthalmic solution Generic drug: brimonidine -timolol  Place 1 drop into both eyes 2 (two) times daily.   diltiazem  30 MG tablet Commonly known as: CARDIZEM  Take 1 tablet (30 mg total) by mouth every 4 (four) hours as  needed (heart rate greater than 120 bpm or in afib).   dorzolamide  2 % ophthalmic solution Commonly known as: TRUSOPT  Place 1 drop into both eyes 3 (three) times daily.   Eliquis  5 MG Tabs tablet Generic drug: apixaban  Take 1 tablet by mouth twice daily    eplerenone  25 MG tablet Commonly known as: INSPRA  Take 1 tablet by mouth once daily   fexofenadine 180 MG tablet Commonly known as: ALLEGRA Take 180 mg by mouth daily.   fluticasone 50 MCG/ACT nasal spray Commonly known as: FLONASE Place 1 spray into both nostrils daily.   furosemide  20 MG tablet Commonly known as: LASIX  Take 1 tablet (20 mg total) by mouth daily. What changed:  when to take this reasons to take this   hydrochlorothiazide  12.5 MG capsule Commonly known as: MICROZIDE  Take 1 capsule (12.5 mg total) by mouth daily.   methocarbamol  500 MG tablet Commonly known as: ROBAXIN  Take 1 tablet (500 mg total) by mouth every 6 (six) hours as needed for muscle spasms.   Pumpkin Seed Oil Caps Take 1 capsule by mouth in the morning and at bedtime. 1,000 mg per cap   telmisartan  40 MG tablet Commonly known as: MICARDIS  Take 1 tablet (40 mg total) by mouth daily.   traMADol  50 MG tablet Commonly known as: ULTRAM  Take 1 tablet (50 mg total) by mouth every 6 (six) hours as needed for severe pain (pain score 7-10) (not releived by tylenol /robaxin ).        Follow-up Information     [facility]. Go in 1 week(s).   Why: for nurses visit for drain removal. call to confirm appointment date/time. Contact information: [address] [place]  [id number]        Maczis, Loura Rower, PA-C Follow up.   Specialty: General Surgery Why: our office is scheduling you for post-operative follow up in 3-4 weeks. call to confirm appointment date/time. Contact information: [address] [place] [address] [phone]         Luther Saltness, MD. Schedule an appointment as soon as possible for a visit in 1 week(s).   Specialty: Internal Medicine Contact information: [address] [place] [address] [phone]         Genell Ken, MD Follow up.   Specialty:  Gastroenterology Contact information: [address] [place] [address] [phone]                 Discharge Instructions     Discharge instructions   Complete by: As directed    **IMPORTANT DISCHARGE INSTRUCTIONS**   From Dr. Darlyn Eke: You were admitted for abdominal pain Here, we found that this was from gallbladder sludge blocking the bile duct  You had an ERCP and Dr. Lavaughn Portland and Dr. Feliberto Hopping placed a biliary stent  YOu need an x-ray in 2 weeks to make sure this stent has fallen out Call Dr. Shelle Devon office if you have questions about this   YOu then had the gallbladder removed Monitor the output from the drain daily If it changes to green, or the amount of output increases significantly, or your pain increases significantly or you have a fever, call Dr. Arvella Bird office (see below in the To Do section) Resume Eliquis  tonight  For pain, take acetaminophen , methocarbamol  or tramadol   Resume your other home medicines but HOLD your cholesterol medicine atorvastatin  for now  Go see your primary in 1 week and  have labs checked.  Ask your primary if you should resume atorvastatin    Increase activity slowly   Complete by: As directed        Discharge Exam: Filed Weights   12/07/23 1824 12/09/23 1217 12/10/23 1112  Weight: 88.4 kg 88.4 kg 88.4 kg    General: Pt is alert, awake, not in acute distress Cardiovascular: RRR, nl S1-S2, no murmurs appreciated.   No LE edema.   Respiratory: Normal respiratory rate and rhythm.  CTAB without rales or wheezes. Abdominal: Abdomen soft and mild tenderness, no significant drainage in his drain no distension or HSM.   Neuro/Psych: Strength symmetric in upper and lower extremities.  Judgment and insight appear normal.   Condition at discharge: good  The results of significant diagnostics from this hospitalization (including imaging, microbiology, ancillary and laboratory) are listed below for reference.    Imaging Studies: DG ERCP Result Date: 12/10/2023 CLINICAL DATA:  [id number] Surgery, elective [id number] EXAM: ERCP COMPARISON:  CT AP, 12/07/2023. MRCP, 12/08/2023. ERCP, 12/09/2023. FLUOROSCOPY: Exposure Index (as provided by the fluoroscopic device): 19.0 mGy Kerma FINDINGS: Limited oblique planar images of the RIGHT upper quadrant obtained C-arm. Images demonstrating flexible endoscopy, biliary duct cannulation, sphincterotomy, retrograde cholangiogram and balloon sweep. No biliary ductal dilation. No evidence of biliary filling defect is demonstrated. A pancreatic duct stent is demonstrated at the end of the case. IMPRESSION: Fluoroscopic imaging for ERCP and pancreatic duct stent placement. For complete description of intra procedural findings, please see performing service dictation. Electronically Signed   By: Art Largo M.D.   On: 12/10/2023 15:17   DG ERCP Result Date: 12/10/2023 CLINICAL DATA:  [id number] Surgery, elective [id number] EXAM: ERCP COMPARISON:  CT AP, 12/07/2023.  MR CP, 12/07/2023. FLUOROSCOPY: Exposure Index (as provided by the fluoroscopic device): 115.7 mGy Kerma FINDINGS: Limited oblique planar images of the RIGHT upper quadrant obtained C-arm. Images demonstrating flexible endoscopy, biliary duct cannulation, sphincterotomy. Retrograde cholangiogram was not demonstrated. IMPRESSION: Fluoroscopic imaging for ERCP. For complete description of intra procedural findings, please see performing service dictation. Electronically Signed   By: Art Largo M.D.   On: 12/10/2023 12:18   DG C-Arm 1-60 Min-No Report Result Date: 12/09/2023 Fluoroscopy was utilized by the requesting physician.  No radiographic interpretation.   MR ABDOMEN MRCP W WO CONTAST Result Date: 12/08/2023 CLINICAL DATA:  Evaluate for biliary obstruction.  Gallstones. EXAM: MRI ABDOMEN WITHOUT AND WITH CONTRAST (INCLUDING MRCP) TECHNIQUE: Multiplanar multisequence MR imaging of the abdomen was performed both before and after  the administration of intravenous contrast. Heavily T2-weighted images of the biliary and pancreatic ducts were obtained, and three-dimensional MRCP images were rendered by post processing. CONTRAST:  10mL GADAVIST  GADOBUTROL  1 MMOL/ML IV SOLN COMPARISON:  CT 12/07/2023 and U/S abdomen limited 12/07/2023. FINDINGS: Lower chest: No acute findings. Hepatobiliary: No mass or other parenchymal abnormality identified. Gallbladder sludge identified. Stone within the gallbladder neck is again seen measuring 6 mm. Gallbladder wall thickness measures 3 mm. No significant pericholecystic fluid. The common bile duct measures 7 mm. Again seen is a small stone within the distal common bile duct, which measured 6 mm on the CT from prior day, image 10 of series 9. No significant intrahepatic bile duct dilatation. Pancreas: No mass, inflammatory changes, or other parenchymal abnormality identified. Spleen:  Within normal limits in size and appearance. Adrenals/Urinary Tract: No masses identified. No evidence of hydronephrosis. Stomach/Bowel: Visualized portions within the abdomen are unremarkable. Vascular/Lymphatic: No pathologically enlarged lymph nodes identified. No abdominal aortic aneurysm demonstrated. Other:  No significant free fluid or fluid collections. Musculoskeletal: No suspicious bone lesions identified. IMPRESSION: 1. Choledocholithiasis with small stone within the distal common bile duct. The common bile duct measures 7 mm. No significant intrahepatic bile duct dilatation. 2. Gallbladder sludge and stone within the gallbladder neck. Gallbladder wall thickness measures 3 mm. No significant pericholecystic fluid. Electronically Signed   By: Kimberley Penman M.D.   On: 12/08/2023 08:41   CT ABDOMEN PELVIS W CONTRAST Result Date: 12/07/2023 CLINICAL DATA:  Epigastric and mid abdominal pain with intermittent nausea EXAM: CT ABDOMEN AND PELVIS WITH CONTRAST TECHNIQUE: Multidetector CT imaging of the abdomen and pelvis  was performed using the standard protocol following bolus administration of intravenous contrast. RADIATION DOSE REDUCTION: This exam was performed according to the departmental dose-optimization program which includes automated exposure control, adjustment of the mA and/or kV according to patient size and/or use of iterative reconstruction technique. CONTRAST:  OMNIPAQUE  IOHEXOL  300 MG/ML  SOLN COMPARISON:  12/07/2023 FINDINGS: Lower chest: Scattered bibasilar subsegmental atelectasis. Normal heart size. No pericardial or pleural effusion. Hepatobiliary: No focal hepatic abnormality or intrahepatic biliary dilatation pattern. Normal hepatic contour. Hepatic and portal veins are patent. Mild gallbladder distension including the gallbladder neck, cystic duct, common hepatic duct, and common bile duct. This appears secondary to distal CBD mildly obstructing stone measuring 6 mm on coronal image 83/7 (choledocholithiasis). Additional nonobstructing gallstone in the gallbladder neck measures 7.7 mm, coronal image 88/7. Trace pericholecystic haziness and slight mucosal enhancement of the cystic duct and mildly dilated extrahepatic biliary tree which may be related to the bstructing distal CBD stone but difficult to exclude associated mild cholangitis/cholecystitis. Pancreas: Unremarkable. No pancreatic ductal dilatation or surrounding inflammatory changes. Spleen: Normal in size without focal abnormality. Adrenals/Urinary Tract: Adrenal glands are unremarkable. Kidneys are normal, without renal calculi, focal lesion, or hydronephrosis. Bladder is unremarkable. Stomach/Bowel: Negative for bowel obstruction, significant dilatation, ileus, or free air. Appendix not visualized. Minor scattered colonic diverticulosis without acute inflammatory process. No free fluid, fluid collection, hemorrhage, hematoma, abscess or ascites. Vascular/Lymphatic: Minor aortoiliac atherosclerosis and tortuosity. No acute vascular process.  No veno-occlusive finding. No adenopathy. Reproductive: Prostate enlargement with coarse calcifications. Other: No abdominal wall hernia or abnormality. No abdominopelvic ascites. Musculoskeletal: Degenerative changes throughout the spine with mild associated dextroscoliosis. No acute osseous finding. IMPRESSION: 1. 6 mm distal CBD mildly obstructing stone (choledocholithiasis). 2. Additional nonobstructing 7.7 mm gallstone in the gallbladder neck. 3. Trace pericholecystic haziness and slight mucosal enhancement of the mildly dilated cystic duct and dilated extrahepatic biliary tree which may be related to the obstructing distal CBD stone but difficult to exclude associated mild cholangitis/cholecystitis. 4. Minor colonic diverticulosis without acute inflammatory process. Aortic Atherosclerosis (ICD10-I70.0). Electronically Signed   By: Melven Stable.  Shick M.D.   On: 12/07/2023 15:01   US  ABDOMEN LIMITED RUQ (LIVER/GB) Result Date: 12/07/2023 CLINICAL DATA:  [id number] Transaminitis [id number], mid abdominal pain EXAM: ULTRASOUND ABDOMEN LIMITED RIGHT UPPER QUADRANT COMPARISON:  None Available. FINDINGS: Gallbladder: Mild gallbladder distension with a thickened wall measuring 7 mm. Hypoechoic intraluminal sludge in the gallbladder body and neck. Subcentimeter gallstones suspected in the gallbladder neck measuring up to 8 mm. No pericholecystic fluid. No Murphy's sign elicited during the exam. Common bile duct: Diameter: Unable to visualize accurately because of bowel gas and high position of the liver. Liver: Mild increased echogenicity, nonspecific. No large focal hepatic abnormality. No intrahepatic biliary dilatation. Normal liver contour. No surrounding free fluid or ascites. Portal vein is patent on color Doppler imaging with normal  direction of blood flow towards the liver. Other: No free fluid. IMPRESSION: 1. Mild gallbladder distension with sludge and subcentimeter cholelithiasis. Thickened gallbladder wall. Negative  Murphys sign. Appearance remains nonspecific. 2. Nonvisualized common bile duct. Electronically Signed   By: Melven Stable.  Shick M.D.   On: 12/07/2023 14:40   DG Foot Complete Left Result Date: 12/07/2023 CLINICAL DATA:  leg swelling EXAM: LEFT FOOT - COMPLETE 3+ VIEW COMPARISON:  None Available. FINDINGS: There is no evidence of fracture or dislocation. Mild dorsal spurring in the midfoot. There is no evidence of arthropathy or other focal bone abnormality. Calcaneal spur. Soft tissues are unremarkable. IMPRESSION: 1. No acute findings. 2. Mild dorsal spurring in the midfoot. Electronically Signed   By: Nicoletta Barrier M.D.   On: 12/07/2023 12:27   DG Chest Portable 1 View Result Date: 12/07/2023 CLINICAL DATA:  Epigastric pain EXAM: PORTABLE CHEST - 1 VIEW COMPARISON:  05/25/2021 FINDINGS: Low lung volumes with linear atelectasis in both lung bases. No overt edema. No pneumothorax. Heart size and mediastinal contours are within normal limits. No effusion. Advanced left glenohumeral DJD. IMPRESSION: Low volumes with bibasilar atelectasis. Electronically Signed   By: Nicoletta Barrier M.D.   On: 12/07/2023 12:26   US  Venous Img Lower Unilateral Left (DVT) Result Date: 12/07/2023 CLINICAL DATA:  Calf swelling. 2-3 weeks. On Eliquis  for atrial fibrillation EXAM: Left LOWER EXTREMITY VENOUS DOPPLER ULTRASOUND TECHNIQUE: Gray-scale sonography with graded compression, as well as color Doppler and duplex ultrasound were performed to evaluate the lower extremity deep venous systems from the level of the common femoral vein and including the common femoral, femoral, profunda femoral, popliteal and calf veins including the posterior tibial, peroneal and gastrocnemius veins when visible. The superficial great saphenous vein was also interrogated. Spectral Doppler was utilized to evaluate flow at rest and with distal augmentation maneuvers in the common femoral, femoral and popliteal veins. COMPARISON:  None Available. FINDINGS:  Contralateral Common Femoral Vein: Respiratory phasicity is normal and symmetric with the symptomatic side. No evidence of thrombus. Normal compressibility. Common Femoral Vein: No evidence of thrombus. Normal compressibility, respiratory phasicity and response to augmentation. Saphenofemoral Junction: No evidence of thrombus. Normal compressibility and flow on color Doppler imaging. Profunda Femoral Vein: No evidence of thrombus. Normal compressibility and flow on color Doppler imaging. Femoral Vein: No evidence of thrombus. Normal compressibility, respiratory phasicity and response to augmentation. Popliteal Vein: No evidence of thrombus. Normal compressibility, respiratory phasicity and response to augmentation. Calf Veins: No evidence of thrombus. Normal compressibility and flow on color Doppler imaging. Superficial Great Saphenous Vein: No evidence of thrombus. Normal compressibility. Venous Reflux:  None. Other Findings:  None. IMPRESSION: No evidence of left lower extremity DVT. Electronically Signed   By: Adrianna Horde M.D.   On: 12/07/2023 12:18    Microbiology: Results for orders placed or performed during the hospital encounter of 09/15/19  SARS Coronavirus 2 by RT PCR (hospital order, performed in [facility] hospital lab) Nasopharyngeal Nasopharyngeal Swab     Status: None   Collection Time: 09/16/19 12:28 AM   Specimen: Nasopharyngeal Swab  Result Value Ref Range Status   SARS Coronavirus 2 NEGATIVE NEGATIVE Final    Comment: Performed at [facility], [address] [place], [place] [address]    Labs: CBC: Recent Labs  Lab 12/08/23 0820 12/09/23 0521 12/10/23 0521 12/11/23 0540 12/12/23 0547  WBC 6.3 6.3 7.0 11.1* 10.1  HGB 14.2 13.7 13.3 13.7 12.9*  HCT 42.1 41.6 41.5 43.1 40.9  MCV 94.0 95.6 96.5 97.5 98.3  PLT 222 207 198 188 188   Basic Metabolic Panel: Recent Labs  Lab 12/07/23 2048 12/08/23 0820 12/09/23 0521 12/10/23 0521 12/11/23 0540  12/12/23 0547  NA  --  138 135 138 138 135  K  --  3.6 3.6 3.6 3.9 4.3  CL  --  106 106 107 106 105  CO2  --  22 21* 22 19* 20*  GLUCOSE  --  112* 114* 88 129* 118*  BUN  --  22 19 25* 25* 25*  CREATININE  --  1.03 1.00 1.33* 0.91 0.93  CALCIUM   --  8.8* 8.6* 8.5* 8.7* 8.4*  MG 2.1  --  2.0 1.9 2.0  --   PHOS  --   --  2.9  --   --   --    Liver Function Tests: Recent Labs  Lab 12/08/23 0820 12/09/23 0521 12/10/23 0521 12/11/23 0540 12/12/23 0547  AST 592* 362* 175* 91* 96*  ALT 925* 719* 495* 369* 271*  ALKPHOS 186* 210* 231* 224* 174*  BILITOT 4.1* 6.5* 2.5* 1.8* 1.7*  PROT 5.8* 5.6* 5.4* 5.8* 5.3*  ALBUMIN 3.3* 3.1* 2.9* 3.0* 2.8*   CBG: No results for input(s): "GLUCAP" in the last 168 hours.  Discharge time spent: approximately 25 minutes spent on discharge counseling, evaluation of patient on day of discharge, and coordination of discharge planning with nursing, social work, pharmacy and case management  Signed: Ephriam Hashimoto, MD [facility] 12/12/2023

## 2023-12-12 NOTE — Plan of Care (Signed)

## 2023-12-12 NOTE — Progress Notes (Signed)
 Pt is tolerating PO diet, reports of minimal to no pain in abdomen. Pt is walking in hallway with wife.

## 2023-12-19 ENCOUNTER — Encounter: Payer: Self-pay | Admitting: Cardiology

## 2023-12-19 DIAGNOSIS — Z79899 Other long term (current) drug therapy: Secondary | ICD-10-CM

## 2023-12-21 LAB — HEPATIC FUNCTION PANEL
ALT: 101 IU/L — ABNORMAL HIGH (ref 0–44)
AST: 56 IU/L — ABNORMAL HIGH (ref 0–40)
Albumin: 3.7 g/dL — ABNORMAL LOW (ref 3.8–4.8)
Alkaline Phosphatase: 273 IU/L — ABNORMAL HIGH (ref 44–121)
Bilirubin Total: 1 mg/dL (ref 0.0–1.2)
Bilirubin, Direct: 0.5 mg/dL — ABNORMAL HIGH (ref 0.00–0.40)
Total Protein: 6 g/dL (ref 6.0–8.5)

## 2023-12-22 ENCOUNTER — Other Ambulatory Visit: Payer: Self-pay | Admitting: Cardiology

## 2024-01-17 NOTE — Telephone Encounter (Signed)
 Attempted to reach pt, voicemail full, unable to leave a message. Will review w/ MD next week.

## 2024-01-29 MED ORDER — ATORVASTATIN CALCIUM 40 MG PO TABS: 40.0000 mg | ORAL_TABLET | Freq: Every day | ORAL | 2 refills | Status: AC

## 2024-02-29 ENCOUNTER — Other Ambulatory Visit: Payer: Self-pay | Admitting: Cardiology

## 2024-03-03 ENCOUNTER — Other Ambulatory Visit: Payer: Self-pay

## 2024-03-03 MED ORDER — EPLERENONE 25 MG PO TABS: 25.0000 mg | ORAL_TABLET | Freq: Every day | ORAL | 2 refills | Status: AC

## 2024-03-05 ENCOUNTER — Other Ambulatory Visit: Payer: Self-pay | Admitting: Cardiology

## 2024-03-19 ENCOUNTER — Telehealth: Payer: Self-pay | Admitting: Cardiology

## 2024-03-19 NOTE — Telephone Encounter (Signed)
 Angela surgery coordinator returning a call to preop to confirm pt's DOB.

## 2024-03-19 NOTE — Telephone Encounter (Signed)
 Left message for Jon to call back about patient's date of birth.

## 2024-03-20 NOTE — Telephone Encounter (Signed)
   Pre-operative Risk Assessment    Patient Name: Jeffrey Contreras  DOB: 13-Feb-1946 MRN: [id number]   Date of last office visit: 11/25/23 WILL CAMNITZ, MD Date of next office visit: NONE   Request for Surgical Clearance    Procedure:  GLAUCOMA NEEDLING  Date of Surgery:  Clearance 04/30/24                                Surgeon:  DR FAIRY CLORA RADDLE Surgeon's Group or Practice Name:  [facility] Phone number:  [phone] Fax number:  [phone]   Type of Clearance Requested:   - Medical  - Pharmacy:  Hold Apixaban  (Eliquis ) 3 DAYS PRIOR AND 7 DAYS AFTER   Type of Anesthesia:  MAC/GENERAL   Additional requests/questions:    Signed, Lucie DELENA Ku   03/20/2024, 9:25 AM

## 2024-03-27 NOTE — Telephone Encounter (Signed)
 I s/w the pt about scheduling an appt in office with Dr. Monetta or APP in the [place] office. Pt tells me that he is confused as to that Dr. Monetta transferred care to Dr. Inocencio and that he should follow Dr. Inocencio as of 2023. I explain to the pt that I will need to ask Dr. Inocencio if he will be willing to sign off clearance. Pt said well he manages my blood thinner. I explained I understand that but I will still need to ask about clearance sign off. I assured the pt that once I have an answer I will call him back and let him know what we will need to do. Pt said ok and thank you.   I am going to forward this back to preop APP and Dr. Inocencio to review the notes and for any further recommendations.

## 2024-03-27 NOTE — Telephone Encounter (Signed)
 Pt and his wife agreeable to appt at [facility] location as this was the only location that had an appt in time for his surgery. Pt states they will be going out town on 04/08/24 and not back until 04/21/24. Please see previous notes.

## 2024-03-27 NOTE — Telephone Encounter (Signed)
   Name: Jeffrey Contreras  DOB: 12/18/1945  MRN: [id number]  Primary Cardiologist: Redell Leiter, MD  Chart reviewed as part of pre-operative protocol coverage. Because of Kenson Groh past medical history and time since last visit, he will require a follow-up in-office visit in order to better assess preoperative cardiovascular risk. Has not seen general cardiology since 2023,(Dr. Munley)  followed by EP last seen (11/25/2023) who does not give clearances.   Patient has not had an Afib/aflutter ablation within the last 3 months or DCCV within the last 30 days   Per office protocol, patient can hold Eliquis  for 3 days prior to procedure.    Patient will not need bridging with Lovenox (enoxaparin) before procedure.   Will need to verify with MD for 7 day hold after procedure, as that is not part of the pharmacy clearance protocol.   Pre-op covering staff: - Please schedule appointment and call patient to inform them. If patient already had an upcoming appointment within acceptable timeframe, please add pre-op clearance to the appointment notes so provider is aware. - Please contact requesting surgeon's office via preferred method (i.e, phone, fax) to inform them of need for appointment prior to surgery.    Lamarr Halm, NP  03/27/2024, 9:26 AM

## 2024-03-27 NOTE — Telephone Encounter (Signed)
 Patient with diagnosis of atrial fibrillation on eliquis  for anticoagulation.    Procedure:  GLAUCOMA NEEDLING   Date of Surgery:  Clearance 04/30/24   CHA2DS2-VASc Score = 4   This indicates a 4.8% annual risk of stroke. The patient's score is based upon: CHF History: 0 HTN History: 1 Diabetes History: 1 Stroke History: 0 Vascular Disease History: 0 Age Score: 2 Gender Score: 0  CrCl 83 Platelet count 188  Patient has not had an Afib/aflutter ablation within the last 3 months or DCCV within the last 30 days  Per office protocol, patient can hold Eliquis  for 3 days prior to procedure.    Patient will not need bridging with Lovenox (enoxaparin) before procedure.  Will need to verify with MD for 7 day hold after procedure, as that is not part of the pharmacy clearance protocol.   **This guidance is not considered finalized until pre-operative APP has relayed final recommendations.**

## 2024-04-06 ENCOUNTER — Encounter (HOSPITAL_BASED_OUTPATIENT_CLINIC_OR_DEPARTMENT_OTHER): Payer: Self-pay

## 2024-04-06 ENCOUNTER — Ambulatory Visit (HOSPITAL_BASED_OUTPATIENT_CLINIC_OR_DEPARTMENT_OTHER): Admitting: Family

## 2024-04-06 ENCOUNTER — Encounter (HOSPITAL_BASED_OUTPATIENT_CLINIC_OR_DEPARTMENT_OTHER): Payer: Self-pay | Admitting: Family

## 2024-04-06 VITALS — BP 120/80 | HR 66 | Ht 67.0 in | Wt 196.0 lb

## 2024-04-06 DIAGNOSIS — I872 Venous insufficiency (chronic) (peripheral): Secondary | ICD-10-CM | POA: Diagnosis not present

## 2024-04-06 DIAGNOSIS — I480 Paroxysmal atrial fibrillation: Secondary | ICD-10-CM | POA: Diagnosis not present

## 2024-04-06 DIAGNOSIS — D6859 Other primary thrombophilia: Secondary | ICD-10-CM | POA: Diagnosis not present

## 2024-04-06 DIAGNOSIS — I10 Essential (primary) hypertension: Secondary | ICD-10-CM

## 2024-04-06 NOTE — Patient Instructions (Addendum)
 Medication Instructions:   DISCONTINUE hydrochlorothiazide .    *If you need a refill on your cardiac medications before your next appointment, please call your pharmacy*  Lab Work:  None ordered.  If you have labs (blood work) drawn today and your tests are completely normal, you will receive your results only by: MyChart Message (if you have MyChart) OR A paper copy in the mail If you have any lab test that is abnormal or we need to change your treatment, we will call you to review the results.  Testing/Procedures:  None ordered.  Follow-Up: At [facility], you and your health needs are our priority.  As part of our continuing mission to provide you with exceptional heart care, our providers are all part of one team.  This team includes your primary Cardiologist (physician) and Advanced Practice Providers or APPs (Physician Assistants and Nurse Practitioners) who all work together to provide you with the care you need, when you need it.  Your next appointment:   2 month(s)  Provider:   Ozell Jodie Passey, PA-C    We recommend signing up for the patient portal called MyChart.  Sign up information is provided on this After Visit Summary.  MyChart is used to connect with patients for Virtual Visits (Telemedicine).  Patients are able to view lab/test results, encounter notes, upcoming appointments, etc.  Non-urgent messages can be sent to your provider as well.   To learn more about what you can do with MyChart, go to [email or web address].   Other Instructions     Okay to hold Eliquis  3 days prior to eye surgery. Reche GORMAN Finder, NP will route note to Dr. Inocencio about the request to hold for 7 days after procedure, we will send you a MyChart message to let you know final decision.   To prevent or reduce lower extremity swelling: Eat a low salt diet. Salt makes the body hold onto extra fluid which causes swelling. Sit with legs elevated. For example, in the  recliner or on an ottoman.  Wear knee-high compression stockings during the daytime. Ones labeled 15-20 mmHg provide good compression.

## 2024-04-06 NOTE — Progress Notes (Addendum)
 Cardiology Office Note   Date:  04/06/2024  ID:  Jeffrey Contreras, DOB 1946/01/15, MRN [id number] PCP: Evonne Sharper, MD  [facility] Providers Cardiologist:  Redell Leiter, MD Electrophysiologist:  Will Gladis Norton, MD     History of Present Illness Jeffrey Contreras is a 78 y.o. male with hx PAF s/p ablation 03/07/22, sinus bradycardia, HTN, DM2.   At visit 11/25/22 with Dr. Norton he was started on Lasix  PRN for bilateral 2+ LE edema.   Since last seen admission 12/07/23 transaminitis and acute cholecystitis with cholecystectomy and biliary stent.   Pending clearance for glaucoma needling 04/30/24. Presents today for follow up with his wife. Reports no shortness of breath nor dyspnea on exertion. Reports no chest pain, pressure, or tightness. No edema, orthopnea, PND. Reports no palpitations.  Last AFib episode 08/09/24 lasting 2 hours which resolved with PRN Diltiazem . He has not required his PRN Lasix . Exercising 4 times per week with dumbbells, bands. Also enjoys playing golf. Participating in vestibular therapy for vertigo with good improvement.   ROS: Please see the history of present illness.    All other systems reviewed and are negative.   Studies Reviewed EKG Interpretation Date/Time:  Monday April 06 2024 08:53:31 EDT Ventricular Rate:  66 PR Interval:  170 QRS Duration:  80 QT Interval:  426 QTC Calculation: 446 R Axis:   19  Text Interpretation: Normal sinus rhythm Normal ECG Confirmed by Vannie Mora ([id number]) on 04/06/2024 9:09:10 AM    Cardiac Studies & Procedures   ______________________________________________________________________________________________     ECHOCARDIOGRAM  ECHOCARDIOGRAM COMPLETE 09/16/2019  Narrative ECHOCARDIOGRAM REPORT    Patient Name:   Jeffrey Contreras Date of Exam: 09/16/2019 Medical Rec #:  [id number]          Height:       69.0 in Accession #:    [id number]         Weight:       202.4 lb Date of Birth:   1946-08-13           BSA:          2.08 m Patient Age:    73 years           BP:           167/80 mmHg Patient Gender: M                  HR:           59 bpm. Exam Location:  Inpatient  Procedure: 2D Echo, Color Doppler and Cardiac Doppler  Indications:    I48.91* Unspecified atrial fibrillation  History:        Patient has no prior history of Echocardiogram examinations. Arrythmias:Atrial Fibrillation; Risk Factors:Hypertension and Diabetes.  Sonographer:    Damien Senior RDCS Referring Phys: [id number] CADENCE H FURTH   Sonographer Comments: Technically difficult study due to poor echo windows. IMPRESSIONS   1. Left ventricular ejection fraction, by visual estimation, is 50 to 55%. The left ventricle has low normal function. Left ventricular septal wall thickness was mildly increased. 2. Left ventricular diastolic parameters are consistent with Grade I diastolic dysfunction (impaired relaxation). 3. Global right ventricle has normal systolic function.The right ventricular size is normal. Right vetricular wall thickness was not assessed. 4. Left atrial size was normal. 5. Right atrial size was normal. 6. The mitral valve is normal in structure. Trivial mitral valve regurgitation. No evidence of mitral stenosis. 7. The tricuspid valve is normal in structure. Tricuspid valve regurgitation  is trivial. 8. The aortic valve is tricuspid. Aortic valve regurgitation is trivial. Mild aortic valve sclerosis without stenosis. 9. The pulmonic valve was normal in structure. Pulmonic valve regurgitation is trivial.  FINDINGS Left Ventricle: Left ventricular ejection fraction, by visual estimation, is 50 to 55%. The left ventricle has low normal function. The left ventricle is not well visualized. There is mildly increased left ventricular hypertrophy. Left ventricular diastolic parameters are consistent with Grade I diastolic dysfunction (impaired relaxation). Indeterminate filling  pressures.  Right Ventricle: The right ventricular size is normal. Right vetricular wall thickness was not assessed. Global RV systolic function is has normal systolic function.  Left Atrium: Left atrial size was normal in size.  Right Atrium: Right atrial size was normal in size  Pericardium: There is no evidence of pericardial effusion.  Mitral Valve: The mitral valve is normal in structure. Trivial mitral valve regurgitation. No evidence of mitral valve stenosis by observation.  Tricuspid Valve: The tricuspid valve is normal in structure. Tricuspid valve regurgitation is trivial.  Aortic Valve: The aortic valve is tricuspid. . There is mild thickening of the aortic valve. Aortic valve regurgitation is trivial. Mild aortic valve sclerosis is present, with no evidence of aortic valve stenosis. There is mild thickening of the aortic valve.  Pulmonic Valve: The pulmonic valve was normal in structure. Pulmonic valve regurgitation is trivial. Pulmonic regurgitation is trivial.  Aorta: The aortic root is normal in size and structure.  Venous: The inferior vena cava was not well visualized.  IAS/Shunts: The interatrial septum was not well visualized.   LEFT VENTRICLE PLAX 2D LVIDd:         4.50 cm  Diastology LVIDs:         2.80 cm  LV e' lateral:   4.13 cm/s LV PW:         0.90 cm  LV E/e' lateral: 10.8 LV IVS:        1.20 cm  LV e' medial:    3.05 cm/s LVOT diam:     1.90 cm  LV E/e' medial:  14.6 LV SV:         63 ml LV SV Index:   29.41 LVOT Area:     2.84 cm   RIGHT VENTRICLE RV S prime:     12.30 cm/s TAPSE (M-mode): 2.5 cm  LEFT ATRIUM             Index       RIGHT ATRIUM           Index LA diam:        2.80 cm 1.35 cm/m  RA Area:     17.00 cm LA Vol (A2C):   66.8 ml 32.17 ml/m RA Volume:   40.10 ml  19.31 ml/m LA Vol (A4C):   49.7 ml 23.93 ml/m LA Biplane Vol: 63.7 ml 30.68 ml/m AORTIC VALVE LVOT Vmax:   118.00 cm/s LVOT Vmean:  81.200 cm/s LVOT VTI:     0.279 m  AORTA Ao Root diam: 2.80 cm Ao Asc diam:  2.50 cm  MITRAL VALVE                        TRICUSPID VALVE MV Area (PHT): 1.84 cm             TR Peak grad:   19.2 mmHg MV PHT:        119.48 msec          TR Vmax:  219.00 cm/s MV Decel Time: 412 msec MV E velocity: 44.60 cm/s 103 cm/s  SHUNTS MV A velocity: 82.30 cm/s 70.3 cm/s Systemic VTI:  0.28 m MV E/A ratio:  0.54       1.5       Systemic Diam: 1.90 cm   Soyla Merck MD Electronically signed by Soyla Merck MD Signature Date/Time: 09/16/2019/4:31:53 PM    Final    MONITORS  LONG TERM MONITOR (3-14 DAYS) 01/05/2022  Narrative Patch Wear Time:  3 days and 1 hours (2023-05-12T13:03:46-0400 to 2023-05-15T14:54:50-0400)  Patient had a min HR of 35 bpm, max HR of 139 bpm, and avg HR of 49 bpm. Predominant underlying rhythm was Sinus Rhythm.  9 Supraventricular Tachycardia runs occurred, the run with the fastest interval lasting 5 beats with a max rate of 139 bpm, the longest lasting 14 beats with an avg rate of 121 bpm. Isolated SVEs were rare (<1.0%), SVE Couplets were rare (<1.0%), and SVE Triplets were rare (<1.0%).  Isolated VEs were rare (<1.0%), and no VE Couplets or VE Triplets were present.  There is 1 triggered and 1 diary event both sinus rhythm  There were no pauses of 3 seconds or greater no episodes of second or third-degree AV block       ______________________________________________________________________________________________      Risk Assessment/Calculations  CHA2DS2-VASc Score = 4   This indicates a 4.8% annual risk of stroke. The patient's score is based upon: CHF History: 0 HTN History: 1 Diabetes History: 1 Stroke History: 0 Vascular Disease History: 0 Age Score: 2 Gender Score: 0            Physical Exam VS:  BP 120/80   Pulse 66   Ht 5' 7 (1.702 m)   Wt 196 lb (88.9 kg)   BMI 30.70 kg/m        Wt Readings from Last 3 Encounters:  04/06/24 196 lb (88.9  kg)  12/10/23 194 lb 14.2 oz (88.4 kg)  11/25/23 202 lb (91.6 kg)    GEN: Well nourished, well developed in no acute distress NECK: No JVD; No carotid bruits CARDIAC: RRR, no murmurs, rubs, gallops RESPIRATORY:  Clear to auscultation without rales, wheezing or rhonchi  ABDOMEN: Soft, non-tender, non-distended EXTREMITIES:  No edema; No deformity   ASSESSMENT AND PLAN  Preop - Exercise tolerance >4 METS. Per AHA/ACC guidelines, he is deemed acceptable risk for the planned procedure without additional cardiovascular testing. May hold Eliquis  3 days prior and 1 day post procedure per Dr. Inocencio' review. Will route to Dr. Clora and team.   PAF s/p ablation / Hypercoagulable state - NSR by EKG today. Last episode of atrial fib 07/2024 lasting 2 hours resolved with PRN Diltiazem . CHA2DS2-VASc Score = 4 [CHF History: 0, HTN History: 1, Diabetes History: 1, Stroke History: 0, Vascular Disease History: 0, Age Score: 2, Gender Score: 0].  Therefore, the patient's annual risk of stroke is 4.8 %.    Continue Eliquis  5mg  BID. Denies bleeding complicatoins.   LE edema - bilateral nonpitting edema. Leg elevation, low salt diet recommended. Continue lasix  20mg  PRN.  HTN - episodes of hypotension at home. Discontinue hydrochlorothiazide . Continue telmisartan  80mg  daily, eplerenone  25mg  daily. Mychart message in 2 weeks to check in. Discussed to monitor BP at home at least 2 hours after medications and sitting for 5-10 minutes.        Dispo: follow up October 2025 with EP team  Signed, Reche GORMAN Finder, NP

## 2024-04-07 ENCOUNTER — Encounter (HOSPITAL_BASED_OUTPATIENT_CLINIC_OR_DEPARTMENT_OTHER): Payer: Self-pay

## 2024-04-30 HISTORY — PX: GLAUCOMA SURGERY: SHX656

## 2024-05-11 ENCOUNTER — Other Ambulatory Visit: Payer: Self-pay | Admitting: Cardiology

## 2024-05-11 DIAGNOSIS — I480 Paroxysmal atrial fibrillation: Secondary | ICD-10-CM

## 2024-05-11 NOTE — Telephone Encounter (Signed)
 Eliquis  5mg  refill request received. Patient is 78 years old, weight-88.9kg, Crea-0.93 on 12/12/23, Diagnosis-Afib, and last seen by Reche Finder on 04/06/24. Dose is appropriate based on dosing criteria. Will send in refill to requested pharmacy.

## 2024-05-19 ENCOUNTER — Other Ambulatory Visit: Payer: Self-pay | Admitting: Cardiology

## 2024-05-26 ENCOUNTER — Encounter: Payer: Self-pay | Admitting: Student

## 2024-05-26 ENCOUNTER — Ambulatory Visit: Attending: Student | Admitting: Student

## 2024-05-26 VITALS — BP 148/88 | HR 60 | Ht 67.0 in | Wt 196.0 lb

## 2024-05-26 DIAGNOSIS — I480 Paroxysmal atrial fibrillation: Secondary | ICD-10-CM

## 2024-05-26 DIAGNOSIS — I10 Essential (primary) hypertension: Secondary | ICD-10-CM

## 2024-05-26 DIAGNOSIS — D6869 Other thrombophilia: Secondary | ICD-10-CM | POA: Diagnosis not present

## 2024-05-26 NOTE — Patient Instructions (Signed)
 Medication Instructions:  Your physician recommends that you continue on your current medications as directed. Please refer to the Current Medication list given to you today.  *If you need a refill on your cardiac medications before your next appointment, please call your pharmacy*  Lab Work: BMET, CBC-TODAY If you have labs (blood work) drawn today and your tests are completely normal, you will receive your results only by: MyChart Message (if you have MyChart) OR A paper copy in the mail If you have any lab test that is abnormal or we need to change your treatment, we will call you to review the results.  Follow-Up: At [facility], you and your health needs are our priority.  As part of our continuing mission to provide you with exceptional heart care, our providers are all part of one team.  This team includes your primary Cardiologist (physician) and Advanced Practice Providers or APPs (Physician Assistants and Nurse Practitioners) who all work together to provide you with the care you need, when you need it.  Your next appointment:   1 year(s)  Provider:   Soyla Norton, MD or APP

## 2024-05-26 NOTE — Progress Notes (Signed)
  Electrophysiology Office Note:   Date:  05/26/2024  ID:  Jeffrey Contreras, DOB Jul 13, 1946, MRN [id number]  Primary Cardiologist: Redell Leiter, MD Electrophysiologist: Soyla Gladis Norton, MD   Electrophysiologist:  Soyla Gladis Norton, MD      History of Present Illness:   Jeffrey Contreras is a 78 y.o. male with h/o  PAF, HTN, and Secondary hypercoagulable state seen today for routine electrophysiology followup.   Since last being seen in our clinic the patient reports doing well overall. No breakthrough AF of which he is aware. Feels like last episode was 07/2023.  Had recent eye surgery and does have some blood trapped behind it. Otherwise, he denies chest pain, palpitations, dyspnea, PND, orthopnea, nausea, vomiting, dizziness, syncope, edema, weight gain, or early satiety.   Review of systems complete and found to be negative unless listed in HPI.   EP Information / Studies Reviewed:    EKG is ordered today. Personal review as below.  EKG Interpretation Date/Time:  Tuesday May 26 2024 12:03:26 EDT Ventricular Rate:  60 PR Interval:  172 QRS Duration:  78 QT Interval:  412 QTC Calculation: 412 R Axis:   18  Text Interpretation: Normal sinus rhythm Normal ECG When compared with ECG of 06-Apr-2024 08:53, No significant change was found Confirmed by Lesia Sharper [phone]) on 05/26/2024 12:08:22 PM    Arrhythmia/Device History No specialty comments available.   Physical Exam:   VS:  BP (!) 148/88 Comment: Manual  Pulse 60   Ht 5' 7 (1.702 m)   Wt 196 lb (88.9 kg)   SpO2 97%   BMI 30.70 kg/m    Wt Readings from Last 3 Encounters:  05/26/24 196 lb (88.9 kg)  04/06/24 196 lb (88.9 kg)  12/10/23 194 lb 14.2 oz (88.4 kg)     GEN: No acute distress NECK: No JVD; No carotid bruits CARDIAC: Regular rate and rhythm, no murmurs, rubs, gallops RESPIRATORY:  Clear to auscultation without rales, wheezing or rhonchi  ABDOMEN: Soft, non-tender,  non-distended EXTREMITIES:  No edema; No deformity   ASSESSMENT AND PLAN:    Paroxysmal Atrial Fibrillation  S/p Ablation 03/07/2022 Continue Eliquis  5mg  BID for CHA2DS2VASC of at least 4 Continue diltiazem  prn Overall satisfied with control. Can consider AAD or re-do ablation if more in the future.    HTN Stable on current regimen    Secondary hypercoagulable state Pt on Eliquis  as above   HFpEF Volume status stable. Continue eplerenone  25 mg daily Continue lasix  20 mg prn.  Reinforced fluid restriction to < 2 L daily, sodium restriction to less than 2000 mg daily, and the importance of daily weights.    Cardiac Clearance If pt requires further eye surgery, he is OK from a cardiac perspective to proceed without further work up.  If he has new SOB or CP, would recommend he follow up.   Follow up with Dr. Norton in 12 months, Sooner with consult for Watchman if wishes to discuss with MD about that.     Signed, Sharper Prentice Lesia, PA-C

## 2024-05-27 ENCOUNTER — Ambulatory Visit: Payer: Self-pay | Admitting: Student

## 2024-05-27 LAB — BASIC METABOLIC PANEL WITH GFR
BUN/Creatinine Ratio: 19 (ref 10–24)
BUN: 18 mg/dL (ref 8–27)
CO2: 21 mmol/L (ref 20–29)
Calcium: 9.2 mg/dL (ref 8.6–10.2)
Chloride: 105 mmol/L (ref 96–106)
Creatinine, Ser: 0.96 mg/dL (ref 0.76–1.27)
Glucose: 82 mg/dL (ref 70–99)
Potassium: 4.8 mmol/L (ref 3.5–5.2)
Sodium: 139 mmol/L (ref 134–144)
eGFR: 80 mL/min/1.73 (ref >59)

## 2024-05-27 LAB — CBC
Hematocrit: 44.6 % (ref 37.5–51.0)
Hemoglobin: 14.6 g/dL (ref 13.0–17.7)
MCH: 30.9 pg (ref 26.6–33.0)
MCHC: 32.7 g/dL (ref 31.5–35.7)
MCV: 95 fL (ref 79–97)
Platelets: 229 x10E3/uL (ref 150–450)
RBC: 4.72 x10E6/uL (ref 4.14–5.80)
RDW: 12.2 % (ref 11.6–15.4)
WBC: 7.1 x10E3/uL (ref 3.4–10.8)

## 2024-08-16 ENCOUNTER — Other Ambulatory Visit: Payer: Self-pay | Admitting: Cardiology
